# Patient Record
Sex: Female | Born: 1960 | Race: Black or African American | Hispanic: No | Smoking: Current every day smoker
Health system: Southern US, Community
[De-identification: ages and names within clinical notes are randomized; demographics above are authoritative.]

## PROBLEM LIST (undated history)

## (undated) DIAGNOSIS — Z860100 Personal history of colon polyps, unspecified: Secondary | ICD-10-CM

## (undated) DIAGNOSIS — Z5189 Encounter for other specified aftercare: Secondary | ICD-10-CM

## (undated) DIAGNOSIS — R319 Hematuria, unspecified: Secondary | ICD-10-CM

## (undated) DIAGNOSIS — K429 Umbilical hernia without obstruction or gangrene: Secondary | ICD-10-CM

## (undated) DIAGNOSIS — E785 Hyperlipidemia, unspecified: Secondary | ICD-10-CM

## (undated) DIAGNOSIS — G4700 Insomnia, unspecified: Secondary | ICD-10-CM

## (undated) DIAGNOSIS — N3281 Overactive bladder: Secondary | ICD-10-CM

## (undated) DIAGNOSIS — N200 Calculus of kidney: Secondary | ICD-10-CM

## (undated) DIAGNOSIS — G5732 Lesion of lateral popliteal nerve, left lower limb: Principal | ICD-10-CM

## (undated) DIAGNOSIS — N3289 Other specified disorders of bladder: Secondary | ICD-10-CM

## (undated) DIAGNOSIS — F419 Anxiety disorder, unspecified: Secondary | ICD-10-CM

## (undated) DIAGNOSIS — I10 Essential (primary) hypertension: Secondary | ICD-10-CM

## (undated) DIAGNOSIS — Z973 Presence of spectacles and contact lenses: Secondary | ICD-10-CM

## (undated) DIAGNOSIS — B009 Herpesviral infection, unspecified: Secondary | ICD-10-CM

## (undated) DIAGNOSIS — K5730 Diverticulosis of large intestine without perforation or abscess without bleeding: Secondary | ICD-10-CM

## (undated) DIAGNOSIS — Q181 Preauricular sinus and cyst: Secondary | ICD-10-CM

## (undated) HISTORY — DX: Umbilical hernia without obstruction or gangrene: K42.9

## (undated) HISTORY — PX: COLONOSCOPY: SHX174

## (undated) HISTORY — DX: Encounter for other specified aftercare: Z51.89

## (undated) HISTORY — DX: Essential (primary) hypertension: I10

## (undated) HISTORY — DX: Preauricular sinus and cyst: Q18.1

## (undated) HISTORY — PX: OTHER SURGICAL HISTORY: SHX169

## (undated) HISTORY — PX: POLYPECTOMY: SHX149

## (undated) HISTORY — PX: COLONOSCOPY W/ POLYPECTOMY: SHX1380

## (undated) HISTORY — DX: Anxiety disorder, unspecified: F41.9

## (undated) HISTORY — DX: Herpesviral infection, unspecified: B00.9

## (undated) HISTORY — DX: Lesion of lateral popliteal nerve, left lower limb: G57.32

## (undated) HISTORY — DX: Insomnia, unspecified: G47.00

## (undated) HISTORY — DX: Overactive bladder: N32.81

## (undated) HISTORY — DX: Hyperlipidemia, unspecified: E78.5

---

## 1982-02-10 HISTORY — PX: ABDOMINAL HYSTERECTOMY: SHX81

## 1988-02-11 HISTORY — PX: ABDOMINOPLASTY: SHX5355

## 1997-07-27 ENCOUNTER — Ambulatory Visit (HOSPITAL_COMMUNITY): Admission: RE | Admit: 1997-07-27 | Discharge: 1997-07-27 | Payer: Self-pay

## 1997-11-30 ENCOUNTER — Ambulatory Visit (HOSPITAL_COMMUNITY): Admission: RE | Admit: 1997-11-30 | Discharge: 1997-11-30 | Payer: Self-pay | Admitting: *Deleted

## 1999-02-11 HISTORY — PX: BREAST BIOPSY: SHX20

## 1999-09-16 ENCOUNTER — Ambulatory Visit (HOSPITAL_COMMUNITY): Admission: RE | Admit: 1999-09-16 | Discharge: 1999-09-16 | Payer: Self-pay | Admitting: Internal Medicine

## 1999-09-16 ENCOUNTER — Encounter: Payer: Self-pay | Admitting: Internal Medicine

## 1999-09-19 ENCOUNTER — Encounter (INDEPENDENT_AMBULATORY_CARE_PROVIDER_SITE_OTHER): Payer: Self-pay | Admitting: *Deleted

## 1999-09-19 ENCOUNTER — Other Ambulatory Visit: Admission: RE | Admit: 1999-09-19 | Discharge: 1999-09-19 | Payer: Self-pay | Admitting: Internal Medicine

## 1999-09-19 ENCOUNTER — Encounter: Payer: Self-pay | Admitting: Internal Medicine

## 1999-09-19 ENCOUNTER — Encounter: Admission: RE | Admit: 1999-09-19 | Discharge: 1999-09-19 | Payer: Self-pay | Admitting: Internal Medicine

## 2000-10-02 ENCOUNTER — Encounter: Admission: RE | Admit: 2000-10-02 | Discharge: 2000-10-02 | Payer: Self-pay | Admitting: *Deleted

## 2000-10-02 ENCOUNTER — Encounter: Payer: Self-pay | Admitting: Family Medicine

## 2001-02-10 ENCOUNTER — Emergency Department (HOSPITAL_COMMUNITY): Admission: EM | Admit: 2001-02-10 | Discharge: 2001-02-10 | Payer: Self-pay | Admitting: Emergency Medicine

## 2001-10-04 ENCOUNTER — Encounter: Payer: Self-pay | Admitting: Family Medicine

## 2001-10-04 ENCOUNTER — Ambulatory Visit (HOSPITAL_COMMUNITY): Admission: RE | Admit: 2001-10-04 | Discharge: 2001-10-04 | Payer: Self-pay | Admitting: Family Medicine

## 2003-09-25 ENCOUNTER — Ambulatory Visit (HOSPITAL_COMMUNITY): Admission: RE | Admit: 2003-09-25 | Discharge: 2003-09-25 | Payer: Self-pay | Admitting: Internal Medicine

## 2003-10-09 ENCOUNTER — Other Ambulatory Visit: Admission: RE | Admit: 2003-10-09 | Discharge: 2003-10-09 | Payer: Self-pay | Admitting: Internal Medicine

## 2004-01-02 ENCOUNTER — Ambulatory Visit (HOSPITAL_COMMUNITY): Admission: RE | Admit: 2004-01-02 | Discharge: 2004-01-02 | Payer: Self-pay

## 2004-01-02 ENCOUNTER — Encounter (INDEPENDENT_AMBULATORY_CARE_PROVIDER_SITE_OTHER): Payer: Self-pay | Admitting: *Deleted

## 2004-01-02 ENCOUNTER — Ambulatory Visit (HOSPITAL_BASED_OUTPATIENT_CLINIC_OR_DEPARTMENT_OTHER): Admission: RE | Admit: 2004-01-02 | Discharge: 2004-01-02 | Payer: Self-pay

## 2004-01-02 HISTORY — PX: BREAST BIOPSY: SHX20

## 2004-01-08 ENCOUNTER — Ambulatory Visit: Payer: Self-pay | Admitting: Gastroenterology

## 2004-01-15 ENCOUNTER — Ambulatory Visit: Payer: Self-pay | Admitting: Internal Medicine

## 2004-02-27 ENCOUNTER — Ambulatory Visit: Payer: Self-pay | Admitting: Internal Medicine

## 2004-03-08 ENCOUNTER — Ambulatory Visit: Payer: Self-pay | Admitting: Internal Medicine

## 2004-03-22 ENCOUNTER — Ambulatory Visit (HOSPITAL_COMMUNITY): Admission: RE | Admit: 2004-03-22 | Discharge: 2004-03-22 | Payer: Self-pay | Admitting: Obstetrics and Gynecology

## 2004-03-28 ENCOUNTER — Ambulatory Visit (HOSPITAL_COMMUNITY): Admission: RE | Admit: 2004-03-28 | Discharge: 2004-03-28 | Payer: Self-pay | Admitting: *Deleted

## 2004-05-10 ENCOUNTER — Encounter (INDEPENDENT_AMBULATORY_CARE_PROVIDER_SITE_OTHER): Payer: Self-pay | Admitting: *Deleted

## 2004-05-10 ENCOUNTER — Observation Stay (HOSPITAL_COMMUNITY): Admission: RE | Admit: 2004-05-10 | Discharge: 2004-05-10 | Payer: Self-pay | Admitting: Obstetrics and Gynecology

## 2004-06-10 HISTORY — PX: OTHER SURGICAL HISTORY: SHX169

## 2004-08-27 ENCOUNTER — Ambulatory Visit (HOSPITAL_BASED_OUTPATIENT_CLINIC_OR_DEPARTMENT_OTHER): Admission: RE | Admit: 2004-08-27 | Discharge: 2004-08-27 | Payer: Self-pay | Admitting: Urology

## 2004-08-27 ENCOUNTER — Encounter (INDEPENDENT_AMBULATORY_CARE_PROVIDER_SITE_OTHER): Payer: Self-pay | Admitting: Specialist

## 2004-08-27 ENCOUNTER — Ambulatory Visit (HOSPITAL_COMMUNITY): Admission: RE | Admit: 2004-08-27 | Discharge: 2004-08-27 | Payer: Self-pay | Admitting: Urology

## 2004-08-27 ENCOUNTER — Encounter (INDEPENDENT_AMBULATORY_CARE_PROVIDER_SITE_OTHER): Payer: Self-pay | Admitting: *Deleted

## 2004-08-27 HISTORY — PX: OTHER SURGICAL HISTORY: SHX169

## 2005-01-07 ENCOUNTER — Ambulatory Visit (HOSPITAL_COMMUNITY): Admission: RE | Admit: 2005-01-07 | Discharge: 2005-01-07 | Payer: Self-pay | Admitting: Internal Medicine

## 2005-02-05 ENCOUNTER — Ambulatory Visit: Payer: Self-pay | Admitting: Internal Medicine

## 2005-02-18 ENCOUNTER — Ambulatory Visit: Payer: Self-pay | Admitting: Internal Medicine

## 2005-02-26 ENCOUNTER — Ambulatory Visit: Payer: Self-pay | Admitting: Internal Medicine

## 2005-03-06 ENCOUNTER — Other Ambulatory Visit: Admission: RE | Admit: 2005-03-06 | Discharge: 2005-03-06 | Payer: Self-pay | Admitting: Obstetrics and Gynecology

## 2006-03-04 ENCOUNTER — Ambulatory Visit: Payer: Self-pay | Admitting: Internal Medicine

## 2006-03-04 LAB — CONVERTED CEMR LAB
ALT: 17 units/L (ref 0–40)
AST: 18 units/L (ref 0–37)
BUN: 10 mg/dL (ref 6–23)
Calcium: 9.4 mg/dL (ref 8.4–10.5)
Chloride: 105 meq/L (ref 96–112)
GFR calc Af Amer: 117 mL/min
GFR calc non Af Amer: 97 mL/min
HCT: 41.7 % (ref 36.0–46.0)
Hemoglobin: 14.3 g/dL (ref 12.0–15.0)
Ketones, ur: NEGATIVE mg/dL
MCV: 97.3 fL (ref 78.0–100.0)
Nitrite: NEGATIVE
Potassium: 3.8 meq/L (ref 3.5–5.1)
RDW: 13.5 % (ref 11.5–14.6)
Sodium: 139 meq/L (ref 135–145)
TSH: 1.73 microintl units/mL (ref 0.35–5.50)
VLDL: 12 mg/dL (ref 0–40)
WBC: 4.1 10*3/uL — ABNORMAL LOW (ref 4.5–10.5)
pH: 7.5 (ref 5.0–8.0)

## 2006-04-15 ENCOUNTER — Encounter: Payer: Self-pay | Admitting: Internal Medicine

## 2006-04-15 ENCOUNTER — Other Ambulatory Visit: Admission: RE | Admit: 2006-04-15 | Discharge: 2006-04-15 | Payer: Self-pay | Admitting: Internal Medicine

## 2006-04-15 ENCOUNTER — Ambulatory Visit: Payer: Self-pay | Admitting: Internal Medicine

## 2006-05-06 ENCOUNTER — Encounter: Admission: RE | Admit: 2006-05-06 | Discharge: 2006-05-06 | Payer: Self-pay | Admitting: Internal Medicine

## 2006-05-22 DIAGNOSIS — M7989 Other specified soft tissue disorders: Secondary | ICD-10-CM | POA: Insufficient documentation

## 2006-05-22 DIAGNOSIS — I872 Venous insufficiency (chronic) (peripheral): Secondary | ICD-10-CM | POA: Insufficient documentation

## 2006-05-22 DIAGNOSIS — E785 Hyperlipidemia, unspecified: Secondary | ICD-10-CM | POA: Insufficient documentation

## 2006-05-22 DIAGNOSIS — B009 Herpesviral infection, unspecified: Secondary | ICD-10-CM | POA: Insufficient documentation

## 2006-07-22 ENCOUNTER — Telehealth (INDEPENDENT_AMBULATORY_CARE_PROVIDER_SITE_OTHER): Payer: Self-pay | Admitting: *Deleted

## 2006-11-11 ENCOUNTER — Ambulatory Visit: Payer: Self-pay | Admitting: Internal Medicine

## 2006-11-11 DIAGNOSIS — G4700 Insomnia, unspecified: Secondary | ICD-10-CM | POA: Insufficient documentation

## 2006-11-11 LAB — CONVERTED CEMR LAB
Blood in Urine, dipstick: NEGATIVE
Glucose, Urine, Semiquant: NEGATIVE
Ketones, urine, test strip: NEGATIVE
Nitrite: NEGATIVE
Specific Gravity, Urine: >=1.03
pH: 5

## 2006-11-26 ENCOUNTER — Telehealth: Payer: Self-pay | Admitting: Internal Medicine

## 2006-12-18 ENCOUNTER — Ambulatory Visit: Payer: Self-pay | Admitting: Family Medicine

## 2007-01-25 ENCOUNTER — Telehealth: Payer: Self-pay | Admitting: Internal Medicine

## 2007-02-05 ENCOUNTER — Telehealth: Payer: Self-pay | Admitting: Internal Medicine

## 2007-03-12 ENCOUNTER — Ambulatory Visit: Payer: Self-pay | Admitting: Internal Medicine

## 2007-03-18 LAB — CONVERTED CEMR LAB
HDL: 80.7 mg/dL (ref >39.0)
Triglycerides: 41 mg/dL (ref 0–149)

## 2007-03-22 ENCOUNTER — Telehealth (INDEPENDENT_AMBULATORY_CARE_PROVIDER_SITE_OTHER): Payer: Self-pay | Admitting: *Deleted

## 2007-05-31 ENCOUNTER — Ambulatory Visit: Payer: Self-pay | Admitting: Internal Medicine

## 2007-09-13 ENCOUNTER — Telehealth (INDEPENDENT_AMBULATORY_CARE_PROVIDER_SITE_OTHER): Payer: Self-pay | Admitting: *Deleted

## 2007-09-23 ENCOUNTER — Telehealth (INDEPENDENT_AMBULATORY_CARE_PROVIDER_SITE_OTHER): Payer: Self-pay | Admitting: *Deleted

## 2007-11-23 ENCOUNTER — Telehealth: Payer: Self-pay | Admitting: Internal Medicine

## 2007-12-20 ENCOUNTER — Telehealth (INDEPENDENT_AMBULATORY_CARE_PROVIDER_SITE_OTHER): Payer: Self-pay | Admitting: *Deleted

## 2008-01-13 ENCOUNTER — Telehealth (INDEPENDENT_AMBULATORY_CARE_PROVIDER_SITE_OTHER): Payer: Self-pay | Admitting: *Deleted

## 2008-01-21 ENCOUNTER — Ambulatory Visit: Payer: Self-pay | Admitting: Family Medicine

## 2008-01-24 ENCOUNTER — Telehealth (INDEPENDENT_AMBULATORY_CARE_PROVIDER_SITE_OTHER): Payer: Self-pay | Admitting: *Deleted

## 2008-02-03 ENCOUNTER — Ambulatory Visit: Payer: Self-pay | Admitting: Internal Medicine

## 2008-02-08 ENCOUNTER — Telehealth (INDEPENDENT_AMBULATORY_CARE_PROVIDER_SITE_OTHER): Payer: Self-pay | Admitting: *Deleted

## 2008-02-17 ENCOUNTER — Encounter: Admission: RE | Admit: 2008-02-17 | Discharge: 2008-02-17 | Payer: Self-pay | Admitting: Internal Medicine

## 2008-02-21 ENCOUNTER — Encounter (INDEPENDENT_AMBULATORY_CARE_PROVIDER_SITE_OTHER): Payer: Self-pay | Admitting: *Deleted

## 2008-02-21 ENCOUNTER — Telehealth (INDEPENDENT_AMBULATORY_CARE_PROVIDER_SITE_OTHER): Payer: Self-pay | Admitting: *Deleted

## 2008-03-16 ENCOUNTER — Telehealth (INDEPENDENT_AMBULATORY_CARE_PROVIDER_SITE_OTHER): Payer: Self-pay | Admitting: *Deleted

## 2008-07-11 ENCOUNTER — Telehealth (INDEPENDENT_AMBULATORY_CARE_PROVIDER_SITE_OTHER): Payer: Self-pay | Admitting: *Deleted

## 2008-07-11 ENCOUNTER — Encounter: Payer: Self-pay | Admitting: Gastroenterology

## 2008-10-06 ENCOUNTER — Telehealth (INDEPENDENT_AMBULATORY_CARE_PROVIDER_SITE_OTHER): Payer: Self-pay | Admitting: *Deleted

## 2008-10-11 ENCOUNTER — Telehealth (INDEPENDENT_AMBULATORY_CARE_PROVIDER_SITE_OTHER): Payer: Self-pay | Admitting: *Deleted

## 2008-10-13 ENCOUNTER — Ambulatory Visit: Payer: Self-pay | Admitting: Pulmonary Disease

## 2008-10-13 ENCOUNTER — Encounter: Payer: Self-pay | Admitting: Internal Medicine

## 2008-11-06 ENCOUNTER — Telehealth (INDEPENDENT_AMBULATORY_CARE_PROVIDER_SITE_OTHER): Payer: Self-pay | Admitting: *Deleted

## 2008-11-09 ENCOUNTER — Encounter: Payer: Self-pay | Admitting: Gastroenterology

## 2008-11-14 ENCOUNTER — Telehealth: Payer: Self-pay | Admitting: Internal Medicine

## 2008-11-29 ENCOUNTER — Telehealth: Payer: Self-pay | Admitting: Internal Medicine

## 2008-12-01 ENCOUNTER — Telehealth (INDEPENDENT_AMBULATORY_CARE_PROVIDER_SITE_OTHER): Payer: Self-pay | Admitting: *Deleted

## 2008-12-01 ENCOUNTER — Encounter: Payer: Self-pay | Admitting: Gastroenterology

## 2008-12-07 ENCOUNTER — Encounter: Payer: Self-pay | Admitting: Internal Medicine

## 2008-12-11 ENCOUNTER — Telehealth: Payer: Self-pay | Admitting: Internal Medicine

## 2008-12-13 ENCOUNTER — Telehealth (INDEPENDENT_AMBULATORY_CARE_PROVIDER_SITE_OTHER): Payer: Self-pay | Admitting: *Deleted

## 2008-12-15 ENCOUNTER — Encounter: Payer: Self-pay | Admitting: Internal Medicine

## 2008-12-19 ENCOUNTER — Ambulatory Visit: Payer: Self-pay | Admitting: Internal Medicine

## 2008-12-19 DIAGNOSIS — R634 Abnormal weight loss: Secondary | ICD-10-CM | POA: Insufficient documentation

## 2008-12-29 ENCOUNTER — Ambulatory Visit: Payer: Self-pay | Admitting: Gastroenterology

## 2008-12-29 DIAGNOSIS — K625 Hemorrhage of anus and rectum: Secondary | ICD-10-CM | POA: Insufficient documentation

## 2009-01-03 ENCOUNTER — Ambulatory Visit: Payer: Self-pay | Admitting: Internal Medicine

## 2009-01-08 ENCOUNTER — Ambulatory Visit: Payer: Self-pay | Admitting: Gastroenterology

## 2009-01-12 ENCOUNTER — Encounter: Payer: Self-pay | Admitting: Gastroenterology

## 2009-01-18 ENCOUNTER — Telehealth: Payer: Self-pay | Admitting: Gastroenterology

## 2009-03-01 ENCOUNTER — Encounter: Admission: RE | Admit: 2009-03-01 | Discharge: 2009-03-01 | Payer: Self-pay | Admitting: Internal Medicine

## 2009-03-05 ENCOUNTER — Ambulatory Visit: Payer: Self-pay | Admitting: Gastroenterology

## 2009-03-05 DIAGNOSIS — Z860100 Personal history of colon polyps, unspecified: Secondary | ICD-10-CM | POA: Insufficient documentation

## 2009-03-23 ENCOUNTER — Ambulatory Visit: Payer: Self-pay | Admitting: Internal Medicine

## 2009-03-26 LAB — CONVERTED CEMR LAB
BUN: 13 mg/dL (ref 6–23)
Basophils Absolute: 0 10*3/uL (ref 0.0–0.1)
Calcium: 8.9 mg/dL (ref 8.4–10.5)
Creatinine, Ser: 0.7 mg/dL (ref 0.4–1.2)
Eosinophils Absolute: 0.1 10*3/uL (ref 0.0–0.7)
GFR calc non Af Amer: 115.31 mL/min (ref >60)
Glucose, Bld: 88 mg/dL (ref 70–99)
Hemoglobin: 12.8 g/dL (ref 12.0–15.0)
Lymphocytes Relative: 41.2 % (ref 12.0–46.0)
Monocytes Relative: 14.2 % — ABNORMAL HIGH (ref 3.0–12.0)
Neutrophils Relative %: 40.8 % — ABNORMAL LOW (ref 43.0–77.0)
Platelets: 159 10*3/uL (ref 150.0–400.0)
RDW: 13.7 % (ref 11.5–14.6)
Sodium: 141 meq/L (ref 135–145)
TSH: 1.91 microintl units/mL (ref 0.35–5.50)

## 2009-08-03 ENCOUNTER — Ambulatory Visit: Payer: Self-pay | Admitting: Internal Medicine

## 2009-08-03 DIAGNOSIS — F411 Generalized anxiety disorder: Secondary | ICD-10-CM | POA: Insufficient documentation

## 2009-08-07 LAB — CONVERTED CEMR LAB
Cholesterol: 251 mg/dL — ABNORMAL HIGH (ref 0–200)
Folate: 9.1 ng/mL
HDL: 88.7 mg/dL (ref >39.00)
Hemoglobin: 12.5 g/dL (ref 12.0–15.0)
Triglycerides: 39 mg/dL (ref 0.0–149.0)
Vitamin B-12: 301 pg/mL (ref 211–911)

## 2009-08-10 ENCOUNTER — Telehealth (INDEPENDENT_AMBULATORY_CARE_PROVIDER_SITE_OTHER): Payer: Self-pay | Admitting: *Deleted

## 2009-08-17 ENCOUNTER — Ambulatory Visit: Payer: Self-pay | Admitting: Family Medicine

## 2009-08-17 LAB — CONVERTED CEMR LAB
Bilirubin Urine: NEGATIVE
Glucose, Urine, Semiquant: NEGATIVE
Specific Gravity, Urine: 1.025
Urobilinogen, UA: 0.2
WBC Urine, dipstick: NEGATIVE
pH: 6

## 2009-08-18 ENCOUNTER — Encounter: Payer: Self-pay | Admitting: Family Medicine

## 2009-08-21 ENCOUNTER — Telehealth (INDEPENDENT_AMBULATORY_CARE_PROVIDER_SITE_OTHER): Payer: Self-pay | Admitting: *Deleted

## 2009-08-21 LAB — CONVERTED CEMR LAB: Crystals: NONE SEEN

## 2009-08-22 ENCOUNTER — Encounter: Payer: Self-pay | Admitting: Internal Medicine

## 2009-09-05 ENCOUNTER — Encounter: Payer: Self-pay | Admitting: Internal Medicine

## 2009-09-13 ENCOUNTER — Telehealth (INDEPENDENT_AMBULATORY_CARE_PROVIDER_SITE_OTHER): Payer: Self-pay | Admitting: *Deleted

## 2009-09-14 ENCOUNTER — Ambulatory Visit: Payer: Self-pay | Admitting: Family Medicine

## 2009-09-14 DIAGNOSIS — N760 Acute vaginitis: Secondary | ICD-10-CM | POA: Insufficient documentation

## 2009-09-14 LAB — CONVERTED CEMR LAB: Whiff Test: POSITIVE

## 2009-09-19 ENCOUNTER — Ambulatory Visit: Payer: Self-pay | Admitting: Internal Medicine

## 2009-09-21 LAB — CONVERTED CEMR LAB
AST: 19 units/L (ref 0–37)
Cholesterol: 175 mg/dL (ref 0–200)
LDL Cholesterol: 86 mg/dL (ref 0–99)
Triglycerides: 41 mg/dL (ref 0.0–149.0)

## 2009-10-12 ENCOUNTER — Telehealth: Payer: Self-pay | Admitting: Internal Medicine

## 2009-11-15 ENCOUNTER — Ambulatory Visit: Payer: Self-pay | Admitting: Internal Medicine

## 2009-11-21 ENCOUNTER — Encounter: Payer: Self-pay | Admitting: Internal Medicine

## 2009-11-27 ENCOUNTER — Telehealth: Payer: Self-pay | Admitting: Internal Medicine

## 2009-12-31 ENCOUNTER — Telehealth: Payer: Self-pay | Admitting: Internal Medicine

## 2010-01-01 ENCOUNTER — Ambulatory Visit (HOSPITAL_BASED_OUTPATIENT_CLINIC_OR_DEPARTMENT_OTHER): Admission: RE | Admit: 2010-01-01 | Discharge: 2010-01-01 | Payer: Self-pay | Admitting: Family Medicine

## 2010-01-01 ENCOUNTER — Ambulatory Visit: Payer: Self-pay | Admitting: Diagnostic Radiology

## 2010-01-01 ENCOUNTER — Ambulatory Visit: Payer: Self-pay | Admitting: Family Medicine

## 2010-01-01 DIAGNOSIS — M25559 Pain in unspecified hip: Secondary | ICD-10-CM | POA: Insufficient documentation

## 2010-01-02 LAB — CONVERTED CEMR LAB
Candida species: NEGATIVE
Gardnerella vaginalis: POSITIVE — AB

## 2010-01-07 ENCOUNTER — Encounter: Payer: Self-pay | Admitting: Internal Medicine

## 2010-01-07 LAB — CONVERTED CEMR LAB
Albumin: 4.2 g/dL (ref 3.5–5.2)
Alkaline Phosphatase: 55 units/L (ref 39–117)
Cholesterol: 218 mg/dL — ABNORMAL HIGH (ref 0–200)
Direct LDL: 101 mg/dL
HDL: 88.9 mg/dL (ref >39.00)
Total CHOL/HDL Ratio: 2
Total Protein: 7 g/dL (ref 6.0–8.3)
VLDL: 10.4 mg/dL (ref 0.0–40.0)

## 2010-01-10 ENCOUNTER — Telehealth: Payer: Self-pay | Admitting: Internal Medicine

## 2010-01-24 ENCOUNTER — Telehealth (INDEPENDENT_AMBULATORY_CARE_PROVIDER_SITE_OTHER): Payer: Self-pay | Admitting: *Deleted

## 2010-01-25 ENCOUNTER — Telehealth (INDEPENDENT_AMBULATORY_CARE_PROVIDER_SITE_OTHER): Payer: Self-pay | Admitting: *Deleted

## 2010-03-10 LAB — CONVERTED CEMR LAB
AST: 21 units/L (ref 0–37)
Alkaline Phosphatase: 57 units/L (ref 39–117)
Chloride: 103 meq/L (ref 96–112)
Direct LDL: 148.5 mg/dL
Eosinophils Relative: 2 % (ref 0–5)
GFR calc non Af Amer: 115 mL/min
Glucose, Bld: 90 mg/dL
HCT: 38.5 % (ref 36.0–46.0)
HDL: 82 mg/dL
Hemoglobin: 12.6 g/dL (ref 12.0–15.0)
LDL Cholesterol: 169 mg/dL
Lymphocytes Relative: 31 % (ref 12–46)
Lymphs Abs: 1.5 10*3/uL (ref 0.7–4.0)
Neutro Abs: 2.7 10*3/uL (ref 1.7–7.7)
Platelets: 243 10*3/uL (ref 150–400)
Potassium: 4 meq/L (ref 3.5–5.1)
TSH: 1.61 microintl units/mL (ref 0.35–5.50)
Total Bilirubin: 0.7 mg/dL (ref 0.3–1.2)
Total CHOL/HDL Ratio: 2.9
Triglycerides: 51 mg/dL
VLDL: 9 mg/dL (ref 0–40)
WBC: 4.8 10*3/uL (ref 4.0–10.5)

## 2010-03-14 NOTE — Progress Notes (Signed)
Summary: please resend prescription to [facility]  Phone Note Call from Patient   Caller: Patient Summary of Call: patient has checked with [facility] this morning and they do not have anything for the prescription for Flagyl sent over yesterday afternoon--please resend Initial call taken by: Jerolyn Shin,  January 25, 2010 8:41 AM  Follow-up for Phone Call        Left Pt detail message Rx re-sent to pharmacy...........Marland KitchenFelecia Deloach CMA  January 25, 2010 9:58 AM     Prescriptions: FLAGYL 500 MG TABS (METRONIDAZOLE) Take 1 by mouth two times a day x7 days  #14 x 0   Entered by:   Jeremy Johann CMA   Authorized by:   Neena Rhymes MD   Signed by:   Jeremy Johann CMA on 01/25/2010   Method used:   Re-Faxed to ...       [facility] [address] (retail)       [phone]       [place], [place]  [address]       Ph: [phone]       Fax: [phone]   RxID:   [id number]

## 2010-03-14 NOTE — Assessment & Plan Note (Signed)
Summary: has infection??///sph   Vital Signs:  Patient profile:   50 year old female Weight:      147 pounds Pulse rate:   73 / minute BP sitting:   114 / 80  (left arm)  Vitals Entered By: Doristine Devoid CMA (January 01, 2010 10:11 AM) CC: yeast infection or bacterial infection noticed odor and discharge   History of Present Illness: 50 yo woman here today for possible GYN infxn.  took Zpack recently.  no d/c but odor.  no itching.  first noticed sxs 1 week ago.  cough- persistant.  otherwise feels well.  no fevers.  feels like a throat 'tickle'.  cough is dry during the day, productive at night.  L hip pain- had pain 1 week ago, took ibuprofen and used heating pad w/ good relief.  pain returned this AM.  doesn't radiate down leg, no weakness or numbness  Allergies (verified): 1)  ! * Latex  Past History:  Past medical, surgical, family and social histories (including risk factors) reviewed for relevance to current acute and chronic problems.  Past Medical History: Reviewed history from 11/15/2009 and no changes required. left calf-leg  is larger than the right leg  for  years (circumferentially 1 inch larger) Hyperlipidemia G2 P1 Ab1 h/o hematuria insomnia  OAB HSV Diverticulosis Hemorrhoids  Past Surgical History: Reviewed history from 01/21/2008 and no changes required. 1990 - "tummy tuck" 5th toes resected bilat Hysterectomy (1984)- has 1 ovary Caesarean section breast bx (RT) 2001 lumpectomy (RT) 2005  bladder cystoscopy  Family History: Reviewed history from 08/03/2009 and no changes required. DM-- F   High cholesterol-- M  Hypertension-- S colon ca-- no breast ca--no  allergies: mother cancer: paternal aunt (lung)   Social History: Reviewed history from 08/03/2009 and no changes required. 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  started at age 51.  1/3  ppd.  Alcohol Use -  no Illicit Drug Use - no  Review of Systems      See HPI  Physical Exam  General:  alert and well-developed.   Head:  face symmetric, NCAT, no TTP over sinuses Eyes:  PERRL, EOMI Ears:  R ear normal and L ear normal.   Nose:  External nasal examination shows no deformity or inflammation. Nasal mucosa are pink and moist without lesions or exudates. Mouth:  Oral mucosa and oropharynx without lesions or exudates.  Teeth in good repair. Lungs:  Normal respiratory effort, chest expands symmetrically. Lungs are clear to auscultation, no crackles or wheezes.  dry cough. Heart:  normal rate, regular rhythm, and no murmur.   Genitalia:  normal introitus and no external lesions.  scant vaginal d/c.  no yeast apparent.   Msk:  L hip flexor mildly TTP.  full ROM of hips bilaterally. Pulses:  +2 DP/PT Extremities:  no C/C/E   Impression & Recommendations:  Problem # 1:  VAGINITIS (ICD-616.10) Assessment Unchanged  check wet prep.  await results to treat. Orders: Specimen Handling (81191) T-Wet Prep ([id number])  Her updated medication list for this problem includes:    Metronidazole 500 Mg Tabs (Metronidazole) .Marland Kitchen... Take one tablet two times a day x7 days  Problem # 2:  COUGH (ICD-786.2) Assessment: New likely post-infectious.  check CXR to r/o infxn.  hold on tx until CXR available for review. Orders: T-2 View CXR (71020TC)  Problem # 3:  HIP PAIN, LEFT (ICD-719.45) Assessment: New likely hip flexor tendonitis.  continue NSAIDs and ice/heat as needed.  Complete Medication List: 1)  Xanax 0.5 Mg Tabs (Alprazolam) .... 2 by mouth at bedtime fax [phone] 2)  Acyclovir 400 Mg Tabs (Acyclovir) .Marland Kitchen.. 1 by mouth two times a day as needed 3)  Citalopram Hydrobromide 20 Mg Tabs (Citalopram hydrobromide) .... 1.5  by mouth once daily 4)  Simvastatin 20 Mg Tabs (Simvastatin) .Marland Kitchen.. 1 by mouth at bedtime. 5)  Flonase 50 Mcg/act Susp (Fluticasone propionate) .... 2 sprays on each side of the  nose once daily 6)  Metronidazole 500 Mg Tabs (Metronidazole) .... Take one tablet two times a day x7 days  Other Orders: Venipuncture (11914) TLB-Lipid Panel (80061-LIPID) TLB-Hepatic/Liver Function Pnl (80076-HEPATIC)  Patient Instructions: 1)  We'll notify you of your lab results 2)  Go to the [facility] on [address] to get your chest xray- we'll call you with these results 3)  Add Claritin or Zyrtec daily for the allergy component 4)  Continue the Flonase daily 5)  Add mucinex to thin your congestion 6)  You have tendonitis- this is causing your hip pain.  Take ibuprofen 2-3x/day and use a heating pad for pain relief 7)  Hang in there! 8)  Happy Holidays!   Orders Added: 1)  Specimen Handling [99000] 2)  T-Wet Prep [[id number]] 3)  T-2 View CXR [71020TC] 4)  Venipuncture [36415] 5)  Specimen Handling [99000] 6)  TLB-Lipid Panel [80061-LIPID] 7)  TLB-Hepatic/Liver Function Pnl [80076-HEPATIC] 8)  Est. Patient Level IV [86578]

## 2010-03-14 NOTE — Assessment & Plan Note (Signed)
Summary: blood in urine//kn   Vital Signs:  Patient profile:   50 year old female Weight:      140 pounds Temp:     98.3 degrees F oral BP sitting:   114 / 64  (left arm)  Vitals Entered By: Doristine Devoid (August 17, 2009 9:12 AM) CC: blood in urine some mild cramping    History of Present Illness: 50 yo woman here today for hematuria.  sxs started yesterday.  also having bleeding from the rectum w/ BM yesterday- recently had colonoscopy w/ polyp removal, told she had diverticulosis (4-5 months ago).  no pain w/ urination.  mild suprapubic pain today- 'a little cramping or something'.  no back pain.  also having blood on toilet tissue after urination.  has seen urology in the past.  pt is a current smoker.  no N/V, fever.  Preventive Screening-Counseling & Management  Alcohol-Tobacco     Smoking Status: current     Smoking Cessation Counseling: yes     Smoke Cessation Stage: contemplative  Problems Prior to Update: 1)  Gross Hematuria  (ICD-599.71) 2)  Anxiety  (ICD-300.00) 3)  Personal History of Colonic Polyps  (ICD-V12.72) 4)  Rectal Bleeding  (ICD-569.3) 5)  Weight Loss  (ICD-783.21) 6)  Persistent Disorder Initiating/maintaining Sleep  (ICD-307.42) 7)  Health Screening  (ICD-V70.0) 8)  Colonoscopy, Hx of  (ICD-V12.79) 9)  ? of Overactive Bladder  (ICD-596.51) 10)  Insomnia-sleep Disorder-unspec  (ICD-307.40) 11)  Venous Insufficiency  (ICD-459.81) 12)  Hsv  (ICD-054.9) 13)  Hyperlipidemia  (ICD-272.4)  Current Medications (verified): 1)  Xanax 0.5 Mg  Tabs (Alprazolam) .... 2 By Mouth At Bedtime Fax [phone] 2)  Acyclovir 400 Mg Tabs (Acyclovir) .Marland Kitchen.. 1 By Mouth Two Times A Day As Needed 3)  Citalopram Hydrobromide 20 Mg Tabs (Citalopram Hydrobromide) .... 1.5  By Mouth Once Daily 4)  Simvastatin 20 Mg Tabs (Simvastatin) .Marland Kitchen.. 1 By Mouth At Bedtime.  Allergies (verified): 1)  ! 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  started at age 11.  1/3  ppd.  Alcohol Use - no Illicit Drug Use - no  Review of Systems      See HPI  Physical Exam  General:  alert, well-developed, and well-nourished.   Abdomen:  soft, NT/ND, +BS Rectal:  small external skin tag, no palpable internal abnormalities but gross blood on glove. Genitalia:  normal introitus, no external lesions, and no vaginal discharge.     Impression & Recommendations:  Problem # 1:  GROSS HEMATURIA (ICD-599.71) Assessment Unchanged pt w/ bright red urine.  recent Hgb WNL.  no evidence of infxn- will not start abx.  pt has seen urology in past.  no pain- stone not likely.  encouraged pt to call urology and try and get appt for today.  given smoking hx concerned for bladder malignancy.  reviewed supportive care and red flags that should prompt return.  Pt expresses understanding and is in agreement w/ this plan. Orders: Specimen Handling (73710) T-Culture, Urine ([id number]) T-Urine Microscopic [id number])  Problem # 2:  RECTAL BLEEDING (ICD-569.3) Assessment: Unchanged pt w/ hx.  recently dx'd w/ diverticulosis.  had BRB yesterday w/ straining.  offered to re-refer to GI- pt not interested in w/u at this time.  encouraged stool softener, increased fluids, fiber.  will follow.  Complete Medication List: 1)  Xanax 0.5 Mg Tabs (Alprazolam) .... 2 by mouth at bedtime fax [phone] 2)  Acyclovir 400 Mg Tabs (Acyclovir) .Marland Kitchen.. 1 by mouth two times a day as needed 3)  Citalopram Hydrobromide 20 Mg Tabs (Citalopram hydrobromide) .... 1.5  by mouth once daily 4)  Simvastatin 20 Mg Tabs (Simvastatin) .Marland Kitchen.. 1 by mouth at bedtime.  Patient  Instructions: 1)  Please call [facility] at [phone] and tell them you are having blood in your urine 2)  Start the stool softener daily, increase your fluid intake, increase your fiber- if you continue to have blood in your stool, please call your GI doctor 3)  STOP SMOKING! 4)  There is no evidence of infection in your urine so an antibiotic will not help at this time 5)  If anything changes over the weekend- you have uncontrolled bleeding or other concerns- please go to the ER 6)  Hang in there!  Laboratory Results   Urine Tests    Routine Urinalysis   Color: red Glucose: negative   (Normal Range: Negative) Bilirubin: negative   (Normal Range: Negative) Ketone: negative   (Normal Range: Negative) Spec. Gravity: 1.025   (Normal Range: 1.003-1.035) Blood: large   (Normal Range: Negative) pH: 6.0   (Normal Range: 5.0-8.0) Protein: negative   (Normal Range: Negative) Urobilinogen: 0.2   (Normal Range: 0-1) Nitrite: negative   (Normal Range: Negative) Leukocyte Esterace: negative   (Normal Range: Negative)       Appended Document: blood in urine//kn     Clinical Lists Changes  Orders: Added new Service order of UA Dipstick w/o Micro (manual) (74259) - Signed

## 2010-03-14 NOTE — Progress Notes (Signed)
Summary: Bronchitis/not a 100% better  Phone Note Call from Patient Call back at Home Phone [phone]   Caller: Patient Summary of Call: Pt was here for bronchiitis, she was prescribed a zpack. She finished the Zpack last monday. Since last monday she has been taking Sudafed, Robitussion DM. Her cough has gotten better. She is having drainage in her throat from her nose. But she can still here it in her chest. Pt would like to know if we would prescribe another round of ATB's. Please Advise.  [facility]  Initial call taken by: Army Fossa CMA,  November 27, 2009 11:39 AM  Follow-up for Phone Call        note reviewed, she had no wheezing rec Flonase 2 sprays on each side of the nose once daily (for post nasal dripping) call #1, no RF continue OTC, wait few more days  Follow-up by: Jose E. Paz MD,  November 28, 2009 3:12 PM  Additional Follow-up for Phone Call Additional follow up Details #1::        Pt is aware.  Additional Follow-up by: Army Fossa CMA,  November 28, 2009 3:15 PM    New/Updated Medications: FLONASE 50 MCG/ACT SUSP (FLUTICASONE PROPIONATE) 2 sprays on each side of the nose once daily Prescriptions: FLONASE 50 MCG/ACT SUSP (FLUTICASONE PROPIONATE) 2 sprays on each side of the nose once daily  #1 x 1   Entered by:   Army Fossa CMA   Authorized by:   Nolon Rod. Paz MD   Signed by:   Army Fossa CMA on 11/28/2009   Method used:   Electronically to        [facility] [address] (retail)       [phone]       [place], [place]  [address]       Ph: [phone]       Fax: [phone]   RxID:   [id number]

## 2010-03-14 NOTE — Progress Notes (Signed)
Summary: ? Yeast Infection  Phone Note Call from Patient Call back at Home Phone [phone]   Caller: Patient Summary of Call: Patient called and LM on triage VM stating that she would like a rx for diflucan because she thinks she has a yeast infection. She has a smell down there not but discharge. She would like it called into [facility]. Please advise.  Initial call taken by: Harold Barban,  September 13, 2009 10:08 AM  Follow-up for Phone Call        Patient was called and advised to come in for an OV because those were not symptoms of a yeast infection.  Patient is coming in this afternoon.  Follow-up by: Harold Barban,  September 13, 2009 12:06 PM

## 2010-03-14 NOTE — Progress Notes (Signed)
Summary: Celexa denial  Phone Note From Other Clinic   Summary of Call: I received fax from [facility] stating that patient qty override of Celexa 20mg  has been denied, any additional qty must be paid for by the patient. We can try to appeal, but I need very detailed/specific reasons why it is medically necessary that the patient MUST take 1.5 per day (only 1 per day is covered). Sending an appeal still does not guarantee payment. Please advise. Initial call taken by: Lucious Groves CMA,  January 10, 2010 12:03 PM  Follow-up for Phone Call        changes prescription to: Citalopram 20 mg "one tablet twice a day" advise patient to take 1.5 tablets daily. Follow-up by: Nolon Rod. Paz MD,  January 11, 2010 9:21 AM  Additional Follow-up for Phone Call Additional follow up Details #1::        I spoke with MD and patient can try the two times a day prescription or just take prescription to a different pharmacy and pay out of pocket. Patient notified and states that she just got prescription for a month and a half and didnt have any problems. She will call when she is ready for another prescription. Additional Follow-up by: Lucious Groves CMA,  January 11, 2010 1:47 PM

## 2010-03-14 NOTE — Assessment & Plan Note (Signed)
Summary: f.u after proc...em    History of Present Illness Visit Type: Follow-up Visit Primary GI MD: Melvia Heaps MD [facility] Primary Provider: Nolon Rod. Paz MD Requesting Provider: n/a Chief Complaint: f/u after colonoscopy, discuss results. Pt denies any GI sx. Pt states the bleeding has stopped since the procedure.  History of Present Illness:   Erin Fox has returned following colonoscopy for rectal bleeding.  A pedunculated polyp in the splenic flexure was removed.  Internal hemorrhoids and diverticula were seen as well.  Since the procedure she has had no further bleeding.  She currently has no GI complaints.   GI Review of Systems      Denies abdominal pain, acid reflux, belching, bloating, chest pain, dysphagia with liquids, dysphagia with solids, heartburn, loss of appetite, nausea, vomiting, vomiting blood, weight loss, and  weight gain.        Denies anal fissure, black tarry stools, change in bowel habit, constipation, diarrhea, diverticulosis, fecal incontinence, heme positive stool, hemorrhoids, irritable bowel syndrome, jaundice, light color stool, liver problems, rectal bleeding, and  rectal pain.    Current Medications (verified): 1)  Xanax 0.5 Mg  Tabs (Alprazolam) .... 2 By Mouth At Bedtime Fax [phone] 2)  Acyclovir 400 Mg Tabs (Acyclovir) .Marland Kitchen.. 1 By Mouth Two Times A Day As Needed 3)  Citalopram Hydrobromide 20 Mg Tabs (Citalopram Hydrobromide) .Marland Kitchen.. 1 By Mouth Once Daily 4)  Detrol La 4 Mg Xr24h-Cap (Tolterodine Tartrate) .Marland Kitchen.. 1 By Mouth Once Daily (Patient Will Start-Near Future)  Allergies (verified): 1)  ! * Latex  Past History:  Past Medical History: Reviewed history from 12/27/2008 and no changes required.      Hyperlipidemia       G2 P1 Ab1       h/o hematuria       insomnia       OAB       HSV       Diverticulosis       Hemorrhoids  Past Surgical History: Reviewed history from 01/21/2008 and no changes required. 1990 - "tummy tuck" 5th toes  resected bilat Hysterectomy (1984)- has 1 ovary Caesarean section breast bx (RT) 2001 lumpectomy (RT) 2005  bladder cystoscopy  Family History: Reviewed history from 10/13/2008 and no changes required. Family History Diabetes 1st degree relative Family History High cholesterol Family History Hypertension   allergies: mother cancer: paternal aunt (lung)   Social History: Reviewed history from 12/29/2008 and no changes required. 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  started at age 73.  1/2 ppd.  Alcohol Use - no Illicit Drug Use - no  Review of Systems  The patient denies allergy/sinus, anemia, anxiety-new, arthritis/joint pain, back pain, blood in urine, breast changes/lumps, change in vision, confusion, cough, coughing up blood, depression-new, fainting, fatigue, fever, headaches-new, hearing problems, heart murmur, heart rhythm changes, itching, menstrual pain, muscle pains/cramps, night sweats, nosebleeds, pregnancy symptoms, shortness of breath, skin rash, sleeping problems, sore throat, swelling of feet/legs, swollen lymph glands, thirst - excessive , urination - excessive , urination changes/pain, urine leakage, vision changes, and voice change.    Vital Signs:  Patient profile:   50 year old female Height:      67.75 inches Weight:      149 pounds BMI:     22.91 Pulse rate:   60 / minute Pulse rhythm:   regular BP sitting:   112 / 68  (right arm) Cuff size:  regular  Vitals Entered By: Christie Nottingham CMA Duncan Dull) (March 05, 2009 10:27 AM)   Impression & Recommendations:  Problem # 1:  PERSONAL HISTORY OF COLONIC POLYPS (ICD-V12.72) Plan followup colonoscopy in 5 years  Problem # 2:  RECTAL BLEEDING (ICD-569.3) Probably secondary to her polyp or possibly from hemorrhoids.  Recommendations #1 hemorrhoidal suppositories for recurrent bleeding

## 2010-03-14 NOTE — Progress Notes (Signed)
Summary: RX  Phone Note Call from Patient Call back at [facility] Phone [phone]   Caller: Patient Summary of Call: PT WOULD LIKE FOR [place] TO CALL INTO [facility] A SCRIPT DO NOT FAX IT ---VALTREX 500 MG 30 DAY SUPPLY NEED THIS TODAY TO BE CALL IN. Initial call taken by: Freddy Jaksch,  August 10, 2009 3:18 PM  Follow-up for Phone Call        Pt is aware that we called in Acylovir 400 mg to the pharmacy.     New/Updated Medications: ACYCLOVIR 400 MG TABS (ACYCLOVIR) 1 by mouth two times a day as needed Prescriptions: ACYCLOVIR 400 MG TABS (ACYCLOVIR) 1 by mouth two times a day as needed  #60 x 0   Entered by:   Army Fossa CMA   Authorized by:   Nolon Rod. Paz MD   Signed by:   Army Fossa CMA on 08/10/2009   Method used:   Telephoned to ...       [facility] [address] (retail)       [phone]       [place], [place]  [address]       Ph: [phone]       Fax: [phone]   RxID:   [id number]

## 2010-03-14 NOTE — Letter (Signed)
Summary: cystoscopy ---[facility]  [facility]   Imported By: Lanelle Bal 09/14/2009 10:16:27  _____________________________________________________________________  External Attachment:    Type:   Image     Comment:   External Document

## 2010-03-14 NOTE — Assessment & Plan Note (Signed)
Summary: cpx/fasting/kdc--Rm 11   Vital Signs:  Patient profile:   50 year old female Height:      67.75 inches Weight:      143.01 pounds BMI:     21.98 Temp:     98.4 degrees F oral Pulse rate:   72 / minute Pulse rhythm:   regular Resp:     16 per minute BP sitting:   120 / 90  (left arm) Cuff size:   regular  Vitals Entered By: Mervin Kung CMA (August 03, 2009 9:56 AM) Is Patient Diabetic? No Comments Pt states she has been taking BC or Goody Powders as needed for headache.  Pt agrees all other medication doses and directions are correct.     History of Present Illness: complete physical exam  ambulatory BPs 134/84 in the last few days but they are usually lower , in the 120s c/o mild-dull- daily  HA that decreases w/ "BC powders" x few months  wonders if HA related to stress at work  wich "way be increasing my BP as well" HA is mostly  the days she works   [employer]  Alcohol-Tobacco     Alcohol drinks/day: <1     Smoking Status: current     Packs/Day: 0.25  Caffeine-Diet-Exercise     Caffeine use/day: none     Does Patient Exercise: no  Allergies: 1)  ! * Latex  Past History:  Past Medical History: left calf-leg  is larger than the right leg 4 years (circumferentially 1 inch larger) Hyperlipidemia G2 P1 Ab1 h/o hematuria insomnia  OAB HSV Diverticulosis Hemorrhoids  Past Surgical History: Reviewed history from 01/21/2008 and no changes required. 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  started at age 89.  1/3  ppd.  Alcohol  Use - no Illicit Drug Use - no Packs/Day:  0.25 Caffeine use/day:  none Does Patient Exercise:  no  Review of Systems CV:  Denies chest pain or discomfort and swelling of feet. Resp:  Denies cough and wheezing. GI:  Denies bloody stools, diarrhea, nausea, and vomiting. GU:  had a UA at a UC, was told she had some blood in the urine but no infection . Psych:  ++ stress at work, see HPI on  SSRIs, anxiety well controlled? sleeps better w/ SSRIs and xanax  mood not depressed  .  Physical Exam  General:  alert, well-developed, and well-nourished.   Neck:  no masses, no thyromegaly, and normal carotid upstroke.   Lungs:  normal respiratory effort, no intercostal retractions, no accessory muscle use, and normal breath sounds.   Heart:  normal rate, regular rhythm, and no murmur.   Abdomen:  soft, non-tender, no distention, no guarding, and no rigidity.   Extremities:  no edema Left leg at the calf is larger than the right for about the inch, his is not  a new finding Neurologic:  alert & oriented X3, cranial nerves II-XII intact, strength normal in all extremities, and gait normal.   Psych:  Cognition and judgment appear intact. Alert and cooperative with normal attention span and concentration.  not anxious  appearing and not depressed appearing.     Impression & Recommendations:  Problem # 1:  HEALTH SCREENING (ICD-V70.0) Td 06 saw gynecology in October 2010 last mammogram January 2011, negative Colonoscopy November 2010: Polyps, diverticula, hemorrhoids. Next colonoscopy ----November 2015 discussed diet and exercise Labs  Orders: Venipuncture (04540) TLB-Lipid Panel (80061-LIPID) TLB-IBC Pnl (Iron/FE;Transferrin) (83550-IBC) TLB-Hemoglobin (Hgb) (85018-HGB) TLB-B12 + Folate Pnl (82746_82607-B12/FOL)  Problem # 2:  INSOMNIA-SLEEP DISORDER-UNSPEC (ICD-307.40) relatively well controlled with SSRI and Xanax, seen next  Problem # 3:  ANXIETY (ICD-300.00) patient is under a  lot of stress at work, she has  frecuent  headaches mostly the days  she goes to work. HA  is likely a tension headache Plan: Recommend counseling if the patient is willing  Increase citalopram to 30 mg daily reassess in 3 months Her updated medication list for this problem includes:    Xanax 0.5 Mg Tabs (Alprazolam) .Marland Kitchen... 2 by mouth at bedtime fax [phone]    Citalopram Hydrobromide 20 Mg Tabs (Citalopram hydrobromide) .Marland Kitchen... 1.5  by mouth once daily  Problem # 4:  WEIGHT LOSS (ICD-783.21) she has lost a couple pounds lately according to our scales but she looks and feels  healthy Labs    Complete Medication List: 1)  Xanax 0.5 Mg Tabs (Alprazolam) .... 2 by mouth at bedtime fax [phone] 2)  Acyclovir 400 Mg Tabs (Acyclovir) .Marland Kitchen.. 1 by mouth two times a day as needed 3)  Citalopram Hydrobromide 20 Mg Tabs (Citalopram hydrobromide) .... 1.5  by mouth once daily  Patient Instructions: 1)  Please schedule a follow-up appointment in 3 months .  Prescriptions: CITALOPRAM HYDROBROMIDE 20 MG TABS (CITALOPRAM HYDROBROMIDE) 1.5  by mouth once daily  #45 x 6   Entered and Authorized by:   Elita Quick E. Khadeeja Elden MD   Signed by:   Nolon Rod. Zonnique Norkus MD on 08/03/2009   Method used:   Electronically to        [facility] [address] (retail)       [phone]       [place], [place]  [address]       Ph: [phone]       Fax: [phone]   RxID:   [id number]    Immunization History:  Tetanus/Td Immunization History:    Tetanus/Td:  historical (07/11/2004)    Vital Signs:  Patient Profile:   50 year old female Height:     67.75 inches Weight:      143.01 pounds BMI:     21.98 Temp:     98.4 degrees F oral Pulse rate:   72 / minute Pulse rhythm:   regular Resp:     16 per minute BP sitting:   120 / 90 Cuff size:   regular                  Current Allergies (reviewed today): ! * LATEX

## 2010-03-14 NOTE — Medication Information (Signed)
Summary: Quantity Limit Exception Form/[employer]  Quantity Limit Exception Form/[employer]   Imported By: Lanelle Bal 01/17/2010 14:19:24  _____________________________________________________________________  External Attachment:    Type:   Image     Comment:   External Document

## 2010-03-14 NOTE — Progress Notes (Signed)
Summary: urine cx  Phone Note Outgoing Call   Call placed by: Doristine Devoid,  August 21, 2009 8:50 AM Call placed to: Patient Summary of Call: pt's urine is not a clean catch due to the large number of skin cells seen.  yeast is most likely a vaginal contaminent.  start Diflucan 150mg  x1 dose.  may repeat in 3 days if sxs continue.  #2.  no refills.  Follow-up for Phone Call        left message on machine .............Marland KitchenDoristine Devoid  August 21, 2009 8:51 AM   spoke w/ patient aware of urine cx informed medication to be called  and says that her urine has cleared up and has appt w/ urologist tomorrow to f/u on. also wanted to let Dr. Drue Novel know that she has change her diet no longer eating fried foods and working on diet and exercise really hard....Marland KitchenMarland KitchenDoristine Devoid  August 21, 2009 11:00 AM     New/Updated Medications: DIFLUCAN 150 MG TABS (FLUCONAZOLE) take one tablet may repeat in 3 days if symptoms persist Prescriptions: DIFLUCAN 150 MG TABS (FLUCONAZOLE) take one tablet may repeat in 3 days if symptoms persist  #2 x 0   Entered by:   Doristine Devoid   Authorized by:   Neena Rhymes MD   Signed by:   Doristine Devoid on 08/21/2009   Method used:   Electronically to        [facility] [address] (retail)       [phone]       [place], [place]  [address]       Ph: [phone]       Fax: [phone]   RxID:   [id number]

## 2010-03-14 NOTE — Assessment & Plan Note (Signed)
Summary: ? yeast infection//lch   Vital Signs:  Patient profile:   50 year old female Weight:      142 pounds Pulse rate:   58 / minute BP sitting:   100 / 60  (left arm)  Vitals Entered By: Doristine Devoid CMA (September 14, 2009 1:42 PM) CC: yeast infection and some odor x2 days    History of Present Illness: 50 yo woman here today for vaginal odor.  sxs started 2 days ago.  no discharge.  reports odor is 'a strong vagina odor'.  took the diflucan a little over a week ago.  saw the urologist 1 week ago.  Allergies (verified): 1)  ! * Latex  Review of Systems      See HPI  Physical Exam  General:  alert, well-developed, and well-nourished.   Genitalia:  normal introitus, no external lesions, no vaginal or cervical lesions, and thin, foul smelling vaginal discharge.     Impression & Recommendations:  Problem # 1:  VAGINITIS (ICD-616.10) Assessment New  pt w/ BV on wet prep.  start Flagyl. Her updated medication list for this problem includes:    Metronidazole 500 Mg Tabs (Metronidazole) .Marland Kitchen... 1 tab by mouth two times a day x7 days  Orders: Wet Prep (16109UE)  Complete Medication List: 1)  Xanax 0.5 Mg Tabs (Alprazolam) .... 2 by mouth at bedtime fax [phone] 2)  Acyclovir 400 Mg Tabs (Acyclovir) .Marland Kitchen.. 1 by mouth two times a day as needed 3)  Citalopram Hydrobromide 20 Mg Tabs (Citalopram hydrobromide) .... 1.5  by mouth once daily 4)  Simvastatin 20 Mg Tabs (Simvastatin) .Marland Kitchen.. 1 by mouth at bedtime. 5)  Diflucan 150 Mg Tabs (Fluconazole) .... Take one tablet may repeat in 3 days if symptoms persist 6)  Metronidazole 500 Mg Tabs (Metronidazole) .Marland Kitchen.. 1 tab by mouth two times a day x7 days 7)  Physical Exam  .... Ms meaghann choo had her complete physical on 08/03/2009  Patient Instructions: 1)  You have BV- this is not a sexually transmitted infection but does need antibiotics 2)  Take the Metronidazole as directed- do not drink alcohol with this medication.  it will make  you sick 3)  Call with any questions or concerns 4)  Have a great weekend! Prescriptions: PHYSICAL EXAM Ms Terianne Thaker had her complete physical on 08/03/2009  #1 x 0   Entered and Authorized by:   Neena Rhymes MD   Signed by:   Neena Rhymes MD on 09/14/2009   Method used:   Print then Give to Patient   RxID:   [id number] METRONIDAZOLE 500 MG  TABS (METRONIDAZOLE) 1 tab by mouth two times a day x7 days  #14 x 0   Entered and Authorized by:   Neena Rhymes MD   Signed by:   Neena Rhymes MD on 09/14/2009   Method used:   Electronically to        [facility] [address] (retail)       [phone]       [place], [place]  [address]       Ph: [phone]       Fax: [phone]   RxID:   [id number]   Laboratory Results    Wet Mount/KOH WBC/hpf 5-10 Bacteria/hpf 3+  Cocci Clue cells/hpf many  Positive whiff Yeast/hpf none Trichomonas/hpf none

## 2010-03-14 NOTE — Assessment & Plan Note (Signed)
Summary: congested/cbs   Vital Signs:  Patient profile:   50 year old female Weight:      145.50 pounds Temp:     100.2 degrees F oral Pulse rate:   78 / minute Pulse rhythm:   regular BP sitting:   110 / 68  (left arm) Cuff size:   regular  Vitals Entered By: Army Fossa CMA (November 15, 2009 8:58 AM) CC: Pt here c/o nasal and chest congestion  Comments Has tried [employer] and sudafed [facility] [address].   History of Present Illness: as above Now is having postnasal dripping, chest congestion  Current Medications (verified): 1)  Xanax 0.5 Mg  Tabs (Alprazolam) .... 2 By Mouth At Bedtime Fax [phone] 2)  Acyclovir 400 Mg Tabs (Acyclovir) .Marland Kitchen.. 1 By Mouth Two Times A Day As Needed 3)  Citalopram Hydrobromide 20 Mg Tabs (Citalopram Hydrobromide) .... 1.5  By Mouth Once Daily 4)  Simvastatin 20 Mg Tabs (Simvastatin) .Marland Kitchen.. 1 By Mouth At Bedtime.  Allergies: 1)  ! * Latex  Past History:  Past Medical History: left calf-leg  is larger than the right leg  for  years (circumferentially 1 inch larger) Hyperlipidemia G2 P1 Ab1 h/o hematuria insomnia  OAB HSV Diverticulosis Hemorrhoids  Past Surgical History: Reviewed history from 01/21/2008 and no changes required. 1990 - "tummy tuck" 5th toes resected bilat Hysterectomy (1984)- has 1 ovary Caesarean section breast bx (RT) 2001 lumpectomy (RT) 2005  bladder cystoscopy  Social History: Reviewed history from 08/03/2009 and no changes required. 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  started at age 35.  1/3  ppd.  Alcohol Use - no Illicit Drug Use - no  Review of Systems General:  no fever prior to visit . ENT:  Denies sore throat. CV:  mild cough ,  no sputum but + chest congestion. GI:  Denies diarrhea, nausea, and vomiting. MS:  Denies muscle aches.  Physical Exam  General:  alert and well-developed.   Head:  face symmetric Ears:  R ear  normal and L ear normal.   Nose:  congestive Mouth:  slightly red, no discharge Lungs:  no intercostal retractions and no accessory muscle use.  large airway congestion and some rhonchi. No crackles or wheezing Heart:  normal rate, regular rhythm, and no murmur.     Impression & Recommendations:  Problem # 1:  BRONCHITIS- ACUTE (ICD-466.0) see instructions     Complete Medication List: 1)  Xanax 0.5 Mg Tabs (Alprazolam) .... 2 by mouth at bedtime fax [phone] 2)  Acyclovir 400 Mg Tabs (Acyclovir) .Marland Kitchen.. 1 by mouth two times a day as needed 3)  Citalopram Hydrobromide 20 Mg Tabs (Citalopram hydrobromide) .... 1.5  by mouth once daily 4)  Simvastatin 20 Mg Tabs (Simvastatin) .Marland Kitchen.. 1 by mouth at bedtime. 5)  Zithromax Z-pak 250 Mg Tabs (Azithromycin) .... As directed  Patient Instructions: 1)  rest, fluids, Tylenol 2)  Robitussin DM 4 times a day for one week, Sudafed as needed 3)  Start Zithromax as prescribed 4)  Come back in few days if no better, call if you get worse Prescriptions: ZITHROMAX Z-PAK 250 MG TABS (AZITHROMYCIN) as directed  #1 x 0   Entered and Authorized by:   Nolon Rod. Felix Pratt MD   Signed by:   Nolon Rod. Omaira Mellen MD on 11/15/2009   Method used:   Electronically to        [facility] [address] (  retail)       [phone]       [place], [place]  [address]       Ph: [phone]       Fax: [phone]   RxID:   [id number]

## 2010-03-14 NOTE — Progress Notes (Signed)
Summary: --has "odor"  Phone Note Call from Patient Call back at Home Phone [phone]   Caller: Patient Summary of Call: saw Dr Beverely Low 11/22 for cold, cough and Lower body "odor"---had to go to urgent care for cold and cough recently  b/c she was still sick and now "odor" is back (no discharge) ---can a prescription for the odor be [facility] [address]?    Please call her at [phone] Initial call taken by: Jerolyn Shin,  January 24, 2010 12:53 PM  Follow-up for Phone Call        Pls advise............Marland KitchenFelecia Deloach CMA  January 24, 2010 3:00 PM   Additional Follow-up for Phone Call Additional follow up Details #1::        ok for flagyl 500mg  by mouth two times a day x7 days Additional Follow-up by: Neena Rhymes MD,  January 24, 2010 3:02 PM    Additional Follow-up for Phone Call Additional follow up Details #2::    Left message to call office............Marland KitchenFelecia Deloach CMA  January 24, 2010 3:08 PM  Pt aware Rx sent to pharmacy...........Marland KitchenFelecia Deloach CMA  January 24, 2010 3:34 PM   New/Updated Medications: FLAGYL 500 MG TABS (METRONIDAZOLE) Take 1 by mouth two times a day x7 days Prescriptions: FLAGYL 500 MG TABS (METRONIDAZOLE) Take 1 by mouth two times a day x7 days  #14 x 0   Entered by:   Jeremy Johann CMA   Authorized by:   Neena Rhymes MD   Signed by:   Jeremy Johann CMA on 01/24/2010   Method used:   Faxed to ...       [facility] [address] (retail)       [phone]       [place], [place]  [address]       Ph: [phone]       Fax: [phone]   RxID:   [id number]

## 2010-03-14 NOTE — Progress Notes (Signed)
Summary: wants to start cholesterol med again  Phone Note Call from Patient Call back at Home Phone [phone]   Caller: Patient Summary of Call: patient  stopped taking cholesterol med because lab result was goood - she changed eating habits & started walking - she said she is getting headaches & bp was 134/84 - she wants to start taking med again - wants to know if she should tak in the am or pm   Initial call taken by: Okey Regal [place],  October 12, 2009 1:28 PM  Follow-up for Phone Call        is better to take it at bedtime Follow-up by: [facility] E. Paz MD,  October 12, 2009 2:21 PM  Additional Follow-up for Phone Call Additional follow up Details #1::        Pt is aware. Army Fossa CMA  October 12, 2009 2:36 PM

## 2010-03-14 NOTE — Medication Information (Signed)
Summary: Formulary Letter Regarding Citalopram/[employer]  Formulary Letter Regarding Citalopram/[employer]   Imported By: Lanelle Bal 12/11/2009 10:21:09  _____________________________________________________________________  External Attachment:    Type:   Image     Comment:   External Document

## 2010-03-14 NOTE — Assessment & Plan Note (Signed)
Summary: 3 mth fu/ns/kdc   Vital Signs:  Patient profile:   50 year old female Height:      67.75 inches Weight:      146.2 pounds BMI:     22.47 Pulse rate:   70 / minute BP sitting:   120 / 78  Vitals Entered By: Shary Decamp (March 23, 2009 10:16 AM) CC: rov   History of Present Illness: follow-up from the last office visit overall feels better  Current Medications (verified): 1)  Xanax 0.5 Mg  Tabs (Alprazolam) .... 2 By Mouth At Bedtime Fax [phone] 2)  Acyclovir 400 Mg Tabs (Acyclovir) .Marland Kitchen.. 1 By Mouth Two Times A Day As Needed 3)  Citalopram Hydrobromide 20 Mg Tabs (Citalopram Hydrobromide) .Marland Kitchen.. 1 By Mouth Once Daily 4)  Detrol La 4 Mg Xr24h-Cap (Tolterodine Tartrate) .Marland Kitchen.. 1 By Mouth Once Daily (Patient Will Start-Near Future)  Allergies (verified): 1)  ! * Latex  Past History:  Past Medical History:      Hyperlipidemia       G2 P1 Ab1       h/o hematuria       insomnia       OAB       HSV       Diverticulosis       Hemorrhoids  Past Surgical History: Reviewed history from 01/21/2008 and no changes required. 1990 - "tummy tuck" 5th toes resected bilat Hysterectomy (1984)- has 1 ovary Caesarean section breast bx (RT) 2001 lumpectomy (RT) 2005  bladder cystoscopy  Social History: Reviewed history from 12/29/2008 and no changes required. 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  started at age 31.  1/2 ppd.  Alcohol Use - no Illicit Drug Use - no  Review of Systems       since the last office visit she is sleeping better she talked with her gynecologist, they recommend not to start HRT her over active bladder symptoms are better, she quit Detrol her appetite is better, although here in the office our scales indicate she has lost 2 pounds, she thinks she has actually gained weight denies anxiety or depression per se, she however thinks that she may have "communications skill issues",  would like to see a counselor once her insurance is better General:  Denies chills and fever.  Physical Exam  General:  alert, well-developed, and well-nourished.   Lungs:  normal respiratory effort, no intercostal retractions, and no accessory muscle use.   Heart:  normal rate, regular rhythm, and no murmur.   Extremities:  no edema Psych:  not anxious appearing and not depressed appearing.     Impression & Recommendations:  Problem # 1:  WEIGHT LOSS (ICD-783.21)  per  our scales she has lost 2 pounds she feels that she has actually gained some weight labs  Orders: Venipuncture (45409) TLB-BMP (Basic Metabolic Panel-BMET) (80048-METABOL) TLB-CBC Platelet - w/Differential (85025-CBCD) TLB-TSH (Thyroid Stimulating Hormone) (84443-TSH)  Problem # 2:  PERSISTENT DISORDER INITIATING/MAINTAINING SLEEP (ICD-307.42) overall sleeping better with alprazolam and citalopram  Problem # 3:  ? of OVERACTIVE BLADDER (ICD-596.51) essentially asymptomatic, she self discontinued Detrol  Complete Medication List: 1)  Xanax 0.5 Mg Tabs (Alprazolam) .... 2 by mouth at bedtime fax [phone] 2)  Acyclovir 400 Mg Tabs (Acyclovir) .Marland Kitchen.. 1 by mouth two times a day as needed 3)  Citalopram Hydrobromide 20 Mg Tabs (Citalopram hydrobromide) .Marland Kitchen.. 1 by mouth once daily  Patient Instructions: 1)  Please schedule a follow-up appointment in 4 months (fasting, physical exam)  Prescriptions: ACYCLOVIR 400 MG TABS (ACYCLOVIR) 1 by mouth two times a day as needed  #60 x 3   Entered and Authorized by:   Elita Quick E. Cayetano Mikita MD   Signed by:   Nolon Rod. Sergey Ishler MD on 03/23/2009   Method used:   Electronically to        [facility] [address] (retail)       [phone]       [place], [place]  [address]       Ph: [phone]       Fax: [phone]   RxID:   [id number]

## 2010-03-14 NOTE — Progress Notes (Signed)
Summary: Quantity override--Citalopram  Phone Note Call from Patient Call back at Home Phone [phone]   Caller: Patient Summary of Call: See copy of letter (phone note dated 11/21/2009) about quantity of medication approved---patient states that she needs 45 per month and prescription is only approved for 30---Can something be sent to her insurance company or a phone request to increase her qty from 30 to 45? Initial call taken by: Jerolyn Shin,  December 31, 2009 5:10 PM  Follow-up for Phone Call        Form requested. Lucious Groves CMA  January 01, 2010 8:31 AM   Forms requested again. Lucious Groves CMA  January 07, 2010 3:21 PM   Forms completed and faxed, will await insurance company reply. Lucious Groves CMA  January 07, 2010 3:54 PM      Appended Document: Quantity override--Citalopram Has been denied, see phone note.

## 2010-03-14 NOTE — Letter (Signed)
Summary: hematuria workup, to have cystoscopy----[facility]   Imported By: Lanelle Bal 08/30/2009 12:58:43  _____________________________________________________________________  External Attachment:    Type:   Image     Comment:   External Document

## 2010-04-25 ENCOUNTER — Telehealth: Payer: Self-pay | Admitting: Internal Medicine

## 2010-04-30 NOTE — Progress Notes (Signed)
Summary: Acyclovir, Simvastatin, Diflucan refills  Phone Note Refill Request Message from:  Patient on April 25, 2010 9:27 AM  Refills Requested: Medication #1:  DIFLUCAN patient wants :Erin Fox, [place], NOT  [place] (see earlier phone note)----also says she needs Diflucan due to symptoms "starting"--has a little odor  Next Appointment Scheduled: none Initial call taken by: Jerolyn Shin,  April 25, 2010 9:37 AM  Follow-up for Phone Call        Left message for pt- was going to offer appt for Dificlulan- left message. Please advise. Army Fossa CMA  April 25, 2010 9:40 AM   Additional Follow-up for Phone Call Additional follow up Details #1::        ok simva 30, 2 Rf ok acyclovir #60, 2 RF no diflucan, OTCs , if no better OV needs  ROV Johna Kearl E. Edahi Kroening MD  April 25, 2010 1:20 PM     Additional Follow-up for Phone Call Additional follow up Details #2::    Left message for pt to call back.   Additional Follow-up for Phone Call Additional follow up Details #3:: Details for Additional Follow-up Action Taken: I spoke w/ pt she will try OTCs and call to make an appt. Army Fossa CMA  April 25, 2010 2:25 PM   fPrescriptions: SIMVASTATIN 20 MG TABS (SIMVASTATIN) 1 by mouth at bedtime. DUE FOR OFFICE VISIT.  #30 x 1   Entered by:   Army Fossa CMA   Authorized by:   Nolon Rod. Coltyn Hanning MD   Signed by:   Army Fossa CMA on 04/25/2010   Method used:   Electronically to        [facility] (retail)       [phone]       [place], [place]  [address]       Ph: [phone]       Fax: [phone]   RxID:   [id number] ACYCLOVIR 400 MG TABS (ACYCLOVIR) 1 by mouth two times a day as needed  #60 x 2   Entered by:   Army Fossa CMA   Authorized by:   Nolon Rod. Hazim Treadway MD   Signed by:   Army Fossa CMA on 04/25/2010   Method used:   Electronically to        [facility] (retail)       [phone]       [place], [place]  [address]       Ph: [phone]       Fax: [phone]   RxID:   [id number]

## 2010-05-10 ENCOUNTER — Other Ambulatory Visit: Payer: Self-pay | Admitting: Internal Medicine

## 2010-05-10 ENCOUNTER — Telehealth: Payer: Self-pay | Admitting: Internal Medicine

## 2010-05-10 NOTE — Telephone Encounter (Signed)
error 

## 2010-06-28 NOTE — Op Note (Signed)
Erin Fox, SUTTER NO.:  [id number]   MEDICAL RECORD NO.:  [id number]          PATIENT TYPE:  AMB   LOCATION:  [facility]                         FACILITY:  [facility]   PHYSICIAN:  [place] [employer], M.D.DATE OF BIRTH:  Dec 25, 1960   DATE OF PROCEDURE:  08/27/2004  DATE OF DISCHARGE:                                 OPERATIVE REPORT   Reference is made office notes from July 18, 2004 to March 2006, March 08, 2004, as well as CAT scan from March 12, 2004.   PREOPERATIVE DIAGNOSIS:  The patient seen initially on referral from Dr.  Drue Novel.  Fiberoptic cystoscopy showed a normal urethra and sphincter.  Clear  reflux right ureteral orifice.  There was an area of erythema around the  left ureteral orifice.  The remainder of the bladder appeared normal as did  the CAT scan.  Repeat cystoscopy June 2006 after gynecologic evaluation with  Dr. Su Hilt showed continued area of edema and erythema surrounding the left  ureteral orifice.  Plans made for cystoscopy, retrogrades, ureteroscopy, and  biopsy.   POSTOPERATIVE DIAGNOSES:  The patient seen initially on referral from Dr.  Drue Novel.  Fiberoptic cystoscopy showed a normal urethra and sphincter.  Clear  reflux right ureteral orifice.  There was an area of erythema around the  left ureteral orifice.  The remainder of the bladder appeared normal as did  the CAT scan.  Repeat cystoscopy June 2006 after gynecologic evaluation with  Dr. Su Hilt showed continued area of edema and erythema surrounding the left  ureteral orifice.  Plans made for cystoscopy, retrogrades, ureteroscopy, and  biopsy.   PROCEDURE:  Cystoscopy, retrogrades, left ureteroscopy, and biopsy.   ANESTHESIA:  General.   DRAINS:  20-French silicone catheter.   SPECIMENS:  Deep and superficial bladder biopsies.   DESCRIPTION OF PROCEDURE:  The patient was prepped and draped in the  dorsolithotomy position after institution of an adequate level of general  anesthesia.  Well-lubricated 21-French panendoscope was gently inserted at  the urethral meatus.  Normal urethra and sphincter.  Trigone showed edema  and erythema around the left ureteral orifice.  Normal right ureteral  orifice.  Clear reflux on both sides.  Bladder was carefully inspected with  the 12 and 70-degree lenses.   Blocking catheter was selected for retrogrades, placed at the right ureteral  orifice and then at the left ureteral orifice.  Identical technique was  used; 3-5 mL of contrast were injected.  Path of the ureter was identified  without filling defect or obstruction.  Additional 2-3 mL of contrast were  injected, pelvis and calyces identified with prompt drainage at 3-5 minutes.   Floppy tip guidewire was then inserted at the left ureteral orifice.  Short  6-French ureteroscope was then inserted, passed without difficulty to the  limit of the short 6-French scope at a spot just above the vessels.  No  obvious evidence of intraureteral pathology was identified.  Ureteroscope  was withdrawn; 6-French end-hole catheter was placed at the left ureteral  orifice.  Biopsies were taken in the erythematous tissue around the left  ureteral orifice  medially, posteriorly and laterally.  Once adequate  sampling had been obtained, biopsy forceps were removed and replaced with  the VaporTrode element on a low setting.  Adequate hemostasis was achieved.  Ureteral catheter was  withdrawn with clear reflux at the left ureteral orifice.  The 20-French  silicone catheter was inserted, bladder irrigated easily; catheter was left  to straight drain, and patient was returned to recovery in satisfactory  condition after having been given a B&O suppository.       RH/MEDQ  D:  08/27/2004  T:  08/27/2004  Job:  [id number]   cc:   Wanda Plump, MD [facility]  [phone] [place], [place] [address]   Osborn Coho, M.D.  Fax: [phone]

## 2010-06-28 NOTE — Op Note (Signed)
NAME:  Erin Fox, Erin Fox NO.:  [id number]   MEDICAL RECORD NO.:  [id number]           PATIENT TYPE:   LOCATION:                                FACILITY:  [facility]   PHYSICIAN:  Osborn Coho, M.D.   DATE OF BIRTH:  [birth year]   DATE OF PROCEDURE:  DATE OF DISCHARGE:                                 OPERATIVE REPORT   PREOPERATIVE DIAGNOSES:  1. Bartholin's cyst.  2. Enlarged left labia.     POSTOPERATIVE DIAGNOSES:  1. Bartholin's cyst.  2. Enlarged left labia.     PROCEDURES:  1. Removal of Bartholin's cyst/gland.  2. Revision of left labia.     ATTENDING:  Osborn Coho, M.D.   ANESTHESIA:  General.   ESTIMATED BLOOD LOSS:  Approximately 500 mL, consistent with postop CBC, EBL  per anesthesia overestimated.   SPECIMEN TO PATHOLOGY:  Bartholin's cyst/gland.   URINE OUTPUT:  Approximately 300 mL as the patient was straight-catheterized  prior to the procedure and during procedure.   PROCEDURE:  The patient was taken to the operating room after the risks,  benefits, and alternatives were discussed with the patient, the patient  verbalized understanding and consent signed and witnessed.  The patient was  placed under a general per anesthesia and prepped and draped in the normal  sterile fashion in a dorsal lithotomy position.  The Bartholin's cyst was  identified and at the left vaginal introitus where a prior incision had been  made, the Bartholin's cyst/gland was excised.  There was a deep pocket in  this area where previous surgery had been performed.  I attempted to remove  the gland as well as the cyst.  The significant portion of blood loss  occurred during the removal of the gland/cyst.  The pocket was then closed  using 0 Vicryl via interrupteds and figure-of-eights.  This was done to the  level of the superficial area of the left vaginal introitus.  The skin was  closed with 2-0 Vicryl using a subcuticular stitch.  Attention was then  turned  to the left labia.  In the fold of the left labia an incision was  performed along the inner length of the labia and the skin undermined in  order to dissect away the underlying tissue.  Significant varicosities were  noted, which were then ligated, cauterized and excised.  A 0.5 cm in width  and approximately 3-4 cm in length portion of excess skin was excised.  The  skin was then reapproximated using 2-0 Vicryl via a subcuticular stitch.  Sponge, lap and needle count were correct.  The patient tolerated the  procedure well and was returned to the recovery room in good condition.      AR/MEDQ  D:  06/10/2004  T:  06/10/2004  Job:  [id number]

## 2010-06-28 NOTE — Op Note (Signed)
NAME:  Erin Fox, Erin Fox NO.:  [id number]   MEDICAL RECORD NO.:  [id number]          PATIENT TYPE:  AMB   LOCATION:  [facility]                          FACILITY:  [facility]   PHYSICIAN:  Lorre Munroe., M.D.DATE OF BIRTH:  1960/12/02   DATE OF PROCEDURE:  01/02/2004  DATE OF DISCHARGE:                                 OPERATIVE REPORT   PREOPERATIVE DIAGNOSIS:  Right breast mass.   POSTOPERATIVE DIAGNOSIS:  Right breast mass.   OPERATION PERFORMED:  Right breast biopsy.   SURGEON:  Lebron Conners, M.D.   ANESTHESIA:  General.   INDICATIONS FOR PROCEDURE:  The patient is a healthy 50 year old black  female who has had a longstanding right breast mass.  She had been reassured  that it was probably benign, but she thought it had gotten a little larger  and asked me to excise it and I agreed to do so.   DESCRIPTION OF PROCEDURE:  After the patient was monitored and anesthetized  and had routine preparation and draping of the lateral aspect of the right  breast, I pinned the mass between my fingers and made a small incision about  2 cm in length right over the mass.  Using the Bovie, I dissected down to  it, then staying very close to the mass, separated it from the surrounding  normal breast gland.  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  After  excising the mass entirely, I got hemostasis with the cautery and closed the  skin with intracuticular 4-0 Vicryl and Steri-Strips and applied a small  bandage.  The patient tolerated the operation well.      Will   WB/MEDQ  D:  01/02/2004  T:  01/02/2004  Job:  [id number]   cc:   Wanda Plump, MD [facility]  [phone] [address] [place], [place] [address]

## 2010-07-19 ENCOUNTER — Other Ambulatory Visit: Payer: Self-pay | Admitting: Internal Medicine

## 2010-07-22 ENCOUNTER — Other Ambulatory Visit: Payer: Self-pay | Admitting: Internal Medicine

## 2010-07-22 MED ORDER — ALPRAZOLAM 0.5 MG PO TABS: 1.0000 mg | ORAL_TABLET | Freq: Every evening | ORAL | Status: DC | PRN

## 2010-07-22 NOTE — Telephone Encounter (Signed)
Okay to refill xanax. 

## 2010-07-22 NOTE — Telephone Encounter (Signed)
60, no RF  Advise patient: Due for OV

## 2010-07-22 NOTE — Telephone Encounter (Signed)
Refill zanax - [facility] - [place]   - patient said she called pharmacy - we didn't receive request

## 2010-07-22 NOTE — Telephone Encounter (Signed)
duplicate

## 2010-07-25 NOTE — Telephone Encounter (Signed)
lmom for patient to call & schedule appt - mailed letter

## 2010-07-29 ENCOUNTER — Other Ambulatory Visit: Payer: Self-pay | Admitting: Internal Medicine

## 2010-07-30 ENCOUNTER — Encounter: Payer: Self-pay | Admitting: Internal Medicine

## 2010-07-30 ENCOUNTER — Telehealth: Payer: Self-pay | Admitting: *Deleted

## 2010-07-30 NOTE — Telephone Encounter (Signed)
Pt is due for appointment w/ Dr. Drue Novel.

## 2010-08-02 NOTE — Telephone Encounter (Signed)
Left message on cell phone to call and schedule followup appt with Dr Velvet Bathe number needs extension--tried to find her on directory--name not listed

## 2010-08-19 ENCOUNTER — Other Ambulatory Visit: Payer: Self-pay | Admitting: Internal Medicine

## 2010-08-20 NOTE — Telephone Encounter (Signed)
Ok 60, 1 RF. Needs OV Before next RF, let pt know

## 2010-08-22 ENCOUNTER — Encounter: Payer: Self-pay | Admitting: Internal Medicine

## 2010-08-22 NOTE — Telephone Encounter (Signed)
Sent letter to call and make followup appt

## 2010-08-26 ENCOUNTER — Other Ambulatory Visit: Payer: Self-pay | Admitting: Internal Medicine

## 2010-09-02 ENCOUNTER — Other Ambulatory Visit: Payer: Self-pay | Admitting: Internal Medicine

## 2010-09-02 DIAGNOSIS — Z1231 Encounter for screening mammogram for malignant neoplasm of breast: Secondary | ICD-10-CM

## 2010-09-04 NOTE — Telephone Encounter (Signed)
Pt has appt scheduled 10/01/10

## 2010-09-12 ENCOUNTER — Ambulatory Visit
Admission: RE | Admit: 2010-09-12 | Discharge: 2010-09-12 | Disposition: A | Discharge status: Home / Self Care | Payer: PRIVATE HEALTH INSURANCE | Source: Ambulatory Visit | Attending: Internal Medicine | Admitting: Internal Medicine

## 2010-09-12 DIAGNOSIS — Z1231 Encounter for screening mammogram for malignant neoplasm of breast: Secondary | ICD-10-CM

## 2010-09-26 ENCOUNTER — Other Ambulatory Visit: Payer: Self-pay | Admitting: Internal Medicine

## 2010-09-26 NOTE — Telephone Encounter (Signed)
Rx Done . 

## 2010-10-01 ENCOUNTER — Encounter: Payer: Self-pay | Admitting: Internal Medicine

## 2010-10-10 ENCOUNTER — Other Ambulatory Visit: Payer: Self-pay | Admitting: Internal Medicine

## 2010-10-17 ENCOUNTER — Other Ambulatory Visit: Payer: Self-pay | Admitting: Internal Medicine

## 2010-10-18 MED ORDER — ALPRAZOLAM 0.5 MG PO TABS: 1.0000 mg | ORAL_TABLET | Freq: Every evening | ORAL | Status: DC | PRN

## 2010-10-18 NOTE — Telephone Encounter (Signed)
Alprazolam request [last refill 08/19/10 #60x1]

## 2010-10-18 NOTE — Telephone Encounter (Signed)
I noticed the patient has an appointment to see me in 2 weeks, okay to call # 60, no refill

## 2010-10-18 NOTE — Telephone Encounter (Signed)
Rx Done/phoned in.

## 2010-10-22 ENCOUNTER — Other Ambulatory Visit: Payer: Self-pay | Admitting: Internal Medicine

## 2010-10-23 ENCOUNTER — Encounter: Payer: Self-pay | Admitting: Internal Medicine

## 2010-11-04 ENCOUNTER — Encounter: Payer: Self-pay | Admitting: Internal Medicine

## 2010-11-05 ENCOUNTER — Ambulatory Visit (INDEPENDENT_AMBULATORY_CARE_PROVIDER_SITE_OTHER): Payer: PRIVATE HEALTH INSURANCE | Admitting: Internal Medicine

## 2010-11-05 ENCOUNTER — Other Ambulatory Visit (HOSPITAL_COMMUNITY)
Admission: RE | Admit: 2010-11-05 | Discharge: 2010-11-05 | Disposition: A | Discharge status: Home / Self Care | Payer: PRIVATE HEALTH INSURANCE | Source: Ambulatory Visit | Attending: Internal Medicine | Admitting: Internal Medicine

## 2010-11-05 ENCOUNTER — Encounter: Payer: Self-pay | Admitting: *Deleted

## 2010-11-05 ENCOUNTER — Encounter: Payer: Self-pay | Admitting: Internal Medicine

## 2010-11-05 DIAGNOSIS — F411 Generalized anxiety disorder: Secondary | ICD-10-CM

## 2010-11-05 DIAGNOSIS — F519 Sleep disorder not due to a substance or known physiological condition, unspecified: Secondary | ICD-10-CM

## 2010-11-05 DIAGNOSIS — Z01419 Encounter for gynecological examination (general) (routine) without abnormal findings: Secondary | ICD-10-CM | POA: Insufficient documentation

## 2010-11-05 DIAGNOSIS — Z0000 Encounter for general adult medical examination without abnormal findings: Secondary | ICD-10-CM | POA: Insufficient documentation

## 2010-11-05 NOTE — Assessment & Plan Note (Signed)
Symptoms well-controlled, no change 

## 2010-11-05 NOTE — Assessment & Plan Note (Signed)
Unable to sleep without alprazolam, she is concerned but  seems to be doing well CHART REVIEWED  nortriptyline, tenazepam, and  ambien did not help her sleep  I recommend to continue with same regimen for now

## 2010-11-05 NOTE — Progress Notes (Signed)
Subjective:    Patient ID: Erin Fox, female    DOB: 07-17-1960, 50 y.o.   MRN: [id number]  HPI CPX Past Medical History  Diagnosis Date  . Hyperlipidemia   . H/O: hematuria     cysto 08-2009 @ urology  . Insomnia   . OAB (overactive bladder)   . HSV (herpes simplex virus) infection   . Diverticulosis   . Hemorrhoids    Past Surgical History  Procedure Date  . Tummy tuck 1990  . 5th toes resected bilateral   . Abdominal hysterectomy 1984    has one ovary  . Cesarean section   . Breast biopsy 2001    right  . Breast lumpectomy 2005    right  . Cystoscopy w/ dilation of bladder    History   Social History  . Marital Status: Divorced    Spouse Name: N/A    Number of Children: 1  . Years of Education: N/A   Occupational History  . quality technician    Social History Main Topics  . Smoking status: Current Everyday Smoker  . Smokeless tobacco: Never Used   Comment: started at 18 smokes 1/2 ppd  . Alcohol Use: Yes     rarely   . Drug Use: No  . Sexually Active: Not on file   Other Topics Concern  . Not on file   Social History Narrative   Lives by herself, currently living in a hotel-- diet: used to be better ---exercise: less than before     Family History  Problem Relation Age of Onset  . Hyperlipidemia Mother   . Diabetes Father   . Hypertension Mother     M and sister   . Cancer Paternal Aunt     lung  . Colon cancer      uncle , dx at age 49s  . Breast cancer Neg Hx     Review of Systems Anxiety-depression well controlled Concerned because she is unable to sleep unless she takes Xanax. Affecting her memory? She is however doing great in her job, able to multitask, getting a promotion. Denies any nausea, vomiting, diarrhea No doing self breast exams Denies any vaginal discharge or vaginal bleeding. Very rarely has hot flashes. Not taking Flonase daily, allergies well controlled Was seen elsewhere with bronchitis, was prescribed a Z-Pak 3  months ago, still has occasional cough.    Objective:   Physical Exam  Constitutional: She is oriented to person, place, and time. She appears well-developed and well-nourished. No distress.  HENT:  Head: Normocephalic and atraumatic.  Neck: No thyromegaly present.       Normal carotid pulses  Cardiovascular: Normal rate, regular rhythm and normal heart sounds.   No murmur heard. Pulmonary/Chest: Effort normal and breath sounds normal. No respiratory distress. She has no wheezes. She has no rales.  Abdominal: Soft. She exhibits no distension. There is no tenderness. There is no rebound and no guarding.  Genitourinary:       Wrist exam normal bilaterally, no axillary lymphadenopathies. GU: External exam normal Vagina free of discharge of lesions Bimanual exam without mass or tenderness.  Musculoskeletal: She exhibits no edema.       Left calf slightly larger than the right for about 1 cm, a chronic finding according to the patient  Neurological: She is alert and oriented to person, place, and time.  Skin: She is not diaphoretic.  Psychiatric: She has a normal mood and affect. Her behavior is normal. Judgment and thought  content normal.          Assessment & Plan:

## 2010-11-05 NOTE — Patient Instructions (Signed)
Please do her self breast exam routinely

## 2010-11-05 NOTE — Assessment & Plan Note (Addendum)
Td 06 saw gynecology in October 2010 and 2011:last PAP? PAP is done today, next in 2 years Had a  Mammogram 2012 ( per pt): negative Colonoscopy November 2010: Polyps, diverticula, hemorrhoids. Next colonoscopy ----November 2015 discussed quitting tobacco, diet and exercise Labs

## 2010-11-06 LAB — CBC WITH DIFFERENTIAL/PLATELET
Basophils Relative: 0.5 % (ref 0.0–3.0)
Eosinophils Absolute: 0.1 10*3/uL (ref 0.0–0.7)
Eosinophils Relative: 3.3 % (ref 0.0–5.0)
Hemoglobin: 13.5 g/dL (ref 12.0–15.0)
Lymphocytes Relative: 41.7 % (ref 12.0–46.0)
MCHC: 33.3 g/dL (ref 30.0–36.0)
Monocytes Relative: 11.2 % (ref 3.0–12.0)
Neutro Abs: 1.5 10*3/uL (ref 1.4–7.7)
Neutrophils Relative %: 43.3 % (ref 43.0–77.0)
RBC: 4.2 Mil/uL (ref 3.87–5.11)
WBC: 3.4 10*3/uL — ABNORMAL LOW (ref 4.5–10.5)

## 2010-11-06 LAB — TSH: TSH: 2.09 u[IU]/mL (ref 0.35–5.50)

## 2010-11-06 LAB — LIPID PANEL
HDL: 97.1 mg/dL (ref >39.00)
Triglycerides: 50 mg/dL (ref 0.0–149.0)
VLDL: 10 mg/dL (ref 0.0–40.0)

## 2010-11-06 LAB — COMPREHENSIVE METABOLIC PANEL
Albumin: 4.2 g/dL (ref 3.5–5.2)
BUN: 13 mg/dL (ref 6–23)
CO2: 28 mEq/L (ref 19–32)
Calcium: 9.4 mg/dL (ref 8.4–10.5)
Chloride: 110 mEq/L (ref 96–112)
Creatinine, Ser: 0.8 mg/dL (ref 0.4–1.2)
GFR: 104.15 mL/min (ref >60.00)
Glucose, Bld: 77 mg/dL (ref 70–99)
Potassium: 5.5 mEq/L — ABNORMAL HIGH (ref 3.5–5.1)

## 2010-11-08 ENCOUNTER — Other Ambulatory Visit: Payer: Self-pay | Admitting: Internal Medicine

## 2010-11-08 NOTE — Telephone Encounter (Signed)
Done

## 2010-11-18 ENCOUNTER — Other Ambulatory Visit: Payer: Self-pay | Admitting: Internal Medicine

## 2010-11-18 NOTE — Telephone Encounter (Signed)
Ok 60 and 3 RF 

## 2010-11-18 NOTE — Telephone Encounter (Signed)
Done

## 2010-12-08 ENCOUNTER — Other Ambulatory Visit: Payer: Self-pay | Admitting: Internal Medicine

## 2010-12-21 ENCOUNTER — Other Ambulatory Visit: Payer: Self-pay | Admitting: Internal Medicine

## 2011-01-08 ENCOUNTER — Other Ambulatory Visit: Payer: Self-pay | Admitting: Internal Medicine

## 2011-03-18 ENCOUNTER — Other Ambulatory Visit: Payer: Self-pay | Admitting: Internal Medicine

## 2011-03-19 NOTE — Telephone Encounter (Signed)
Ok 60, 3 RF 

## 2011-03-19 NOTE — Telephone Encounter (Signed)
Refill request xanax 0.5mg  #60 with 3 refills. Last refilled 10.8.12. OK to refill?

## 2011-03-19 NOTE — Telephone Encounter (Signed)
Refill done.  

## 2011-04-09 ENCOUNTER — Other Ambulatory Visit: Payer: Self-pay | Admitting: Internal Medicine

## 2011-04-10 NOTE — Telephone Encounter (Signed)
Refill done.  

## 2011-04-30 ENCOUNTER — Telehealth: Payer: Self-pay | Admitting: *Deleted

## 2011-05-06 ENCOUNTER — Telehealth: Payer: Self-pay | Admitting: Internal Medicine

## 2011-05-06 DIAGNOSIS — G4730 Sleep apnea, unspecified: Secondary | ICD-10-CM

## 2011-05-06 NOTE — Telephone Encounter (Signed)
Would you like for me to set up the referral? Please advise.

## 2011-05-06 NOTE — Telephone Encounter (Signed)
LMOVM for pt to return call 

## 2011-05-06 NOTE — Telephone Encounter (Signed)
She has a long history of insomnia, unable to sleep without Xanax. I don't know that a sleep study will help her "come off Xanax". I recommend a visit with one of our sleep medicine doctors, please arrange if the patient is agreeable

## 2011-05-06 NOTE — Telephone Encounter (Signed)
Patient wants to do a sleep study to come off of the xanax. She has already contacted [facility] would like a referral. Patient ph# [phone] Also patient stated she has been following the low sodium diet , taking flax see & BP is normal as of now at 116/78

## 2011-05-07 NOTE — Telephone Encounter (Signed)
Discussed with pt & set up referral to sleep med doctor.

## 2011-05-14 ENCOUNTER — Other Ambulatory Visit: Payer: Self-pay | Admitting: Internal Medicine

## 2011-05-14 NOTE — Telephone Encounter (Signed)
Refill done.  

## 2011-05-20 ENCOUNTER — Encounter: Payer: Self-pay | Admitting: Pulmonary Disease

## 2011-05-20 ENCOUNTER — Ambulatory Visit (INDEPENDENT_AMBULATORY_CARE_PROVIDER_SITE_OTHER): Payer: BC Managed Care – PPO | Admitting: Pulmonary Disease

## 2011-05-20 VITALS — BP 110/82 | HR 73 | Temp 98.6°F | Ht 67.0 in | Wt 148.0 lb

## 2011-05-20 DIAGNOSIS — G4700 Insomnia, unspecified: Secondary | ICD-10-CM

## 2011-05-20 NOTE — Assessment & Plan Note (Signed)
The patient continues to have issues with initiating and maintaining sleep.  I believe these are behavioral in nature, and are worsened by her poor sleep hygiene.  I have again gone over various things to try, but because of her long-standing issue that is clearly deep-seeded, I think she needs to see a behavioral specialist for cognitive behavioral therapy.  I have told her that if she is able to sleep through the night, and has issues with nonrestorative sleep and significant daytime sleepiness, we could then possibly evaluate her for sleep disordered breathing.

## 2011-05-20 NOTE — Patient Instructions (Signed)
1) ritualistic behaviors before going to bed.  2) no tv watching or reading in bed. You should only lie down in bed to sleep at night!!! No napping at any time during the day or night.  3) Get out of bed everyday no later than 9am, whether you have slept one or 8 hrs.  4) Do not stay in bed more than if you cannot initiate sleep. Leave bedroom and go to family room to read or watch tv until sleepy again. May have to do this multiple time at night.  5) I will recommend to Dr. Drue Novel to send you to a psychiatrist or psychologist for "cognitive behavioral therapy" to help you "turn off your brain" at night. 6) regarding your xanax, I agree with coming off this unless you have an anxiety disorder.  This will have to be tapered off, and not stopped all at once.  Would discuss with your primary md how to do this.  7) try melatonin 3mg  about 3-4 hours BEFORE bedtime.

## 2011-05-20 NOTE — Progress Notes (Signed)
  Subjective:    Patient ID: Erin Fox, female    DOB: [birth year], 51 y.o.   MRN: [id number]  HPI Patient comes in today for followup of insomnia.  She has not been seen since 2010, and was felt to have psychophysiologic insomnia primarily related to behavioral issues and poor sleep hygiene.  I gave her very specific behavioral modifications, but unfortunately she has not done any of these.  She continues to watch television in bed, is not getting up early enough to take advantage of the morning to light, and is still taking 1-2 hour naps during the day.  She is still taking Xanax in the evening to try to help her get to sleep, and has tried on a few occasions to abruptly discontinue the medicine.  I told her this cannot be done safely.  She is concerned that it is causing memory loss during the day, and would like to get off of this.  She is also concerned about the possibility of sleep apnea because of mild snoring, however denies any significant inappropriate daytime sleepiness when not lying down, and feels that she has had a good nights rest when she is able to sleep completely through the night.  I also feel that it would be near impossible to get an adequate study with her known insomnia.   Review of Systems  Constitutional: Positive for diaphoresis. Negative for fever and unexpected weight change.  HENT: Positive for congestion. Negative for ear pain, nosebleeds, sore throat, rhinorrhea, sneezing, trouble swallowing, dental problem, postnasal drip and sinus pressure.   Eyes: Negative for redness and itching.  Respiratory: Negative for cough, chest tightness, shortness of breath and wheezing.   Cardiovascular: Positive for leg swelling. Negative for palpitations.  Gastrointestinal: Negative for nausea and vomiting.  Genitourinary: Negative for dysuria.  Musculoskeletal: Negative for joint swelling.  Skin: Negative for rash.  Neurological: Negative for headaches.  Hematological: Does not  bruise/bleed easily.  Psychiatric/Behavioral: Negative for dysphoric mood. The patient is not nervous/anxious.        Objective:   Physical Exam Thin female in no acute distress Nose without purulence or discharge noted Chest totally clear Cardiac exam with regular rate and rhythm Lower extremities without edema, no cyanosis Alert and oriented, does not appear to be sleepy, moves all 4 extremities.       Assessment & Plan:

## 2011-05-22 ENCOUNTER — Other Ambulatory Visit: Payer: Self-pay | Admitting: Internal Medicine

## 2011-05-22 NOTE — Telephone Encounter (Signed)
Refill done.  

## 2011-06-03 NOTE — Telephone Encounter (Signed)
Done

## 2011-07-17 ENCOUNTER — Other Ambulatory Visit: Payer: Self-pay | Admitting: Internal Medicine

## 2011-07-18 NOTE — Telephone Encounter (Signed)
60, 3 RF 

## 2011-07-18 NOTE — Telephone Encounter (Signed)
Refill done.  

## 2011-07-18 NOTE — Telephone Encounter (Signed)
Ok to refill 

## 2011-08-02 ENCOUNTER — Other Ambulatory Visit: Payer: Self-pay | Admitting: Internal Medicine

## 2011-08-04 NOTE — Telephone Encounter (Signed)
Refill done.  

## 2011-09-23 ENCOUNTER — Other Ambulatory Visit: Payer: Self-pay | Admitting: Internal Medicine

## 2011-09-23 DIAGNOSIS — Z1231 Encounter for screening mammogram for malignant neoplasm of breast: Secondary | ICD-10-CM

## 2011-09-26 ENCOUNTER — Ambulatory Visit
Admission: RE | Admit: 2011-09-26 | Discharge: 2011-09-26 | Disposition: A | Discharge status: Home / Self Care | Payer: BC Managed Care – PPO | Source: Ambulatory Visit | Attending: Internal Medicine | Admitting: Internal Medicine

## 2011-09-26 DIAGNOSIS — Z1231 Encounter for screening mammogram for malignant neoplasm of breast: Secondary | ICD-10-CM

## 2011-10-09 ENCOUNTER — Other Ambulatory Visit: Payer: Self-pay | Admitting: Internal Medicine

## 2011-10-09 NOTE — Telephone Encounter (Signed)
Refill done.  

## 2011-11-11 ENCOUNTER — Other Ambulatory Visit: Payer: Self-pay | Admitting: Internal Medicine

## 2011-11-12 NOTE — Telephone Encounter (Signed)
Refill done.  

## 2011-11-19 ENCOUNTER — Other Ambulatory Visit: Payer: Self-pay | Admitting: Internal Medicine

## 2011-11-19 NOTE — Telephone Encounter (Signed)
lmovm for pt to return call.  

## 2011-11-19 NOTE — Telephone Encounter (Signed)
Last OV 05/20/11.  Last filled 07/17/11 #60 x3 Need Okay.        MW

## 2011-11-19 NOTE — Telephone Encounter (Signed)
Advise patient, It went ahead and Rx alprazolam 60 tablets, no RF She saw a sleep specialist, he recommended behavioral therapy as well as melatonin 3mg  about 3-4 hours BEFORE bedtime. I do recommend to take melatonin in addition to alprazolam. Also arrange a referral to psychology for a cognitive behavioral therapy.

## 2011-11-20 NOTE — Telephone Encounter (Signed)
Discussed with pt. She states that she has not been to behavioral therapy yet because of her insurance. She is getting a new insurance in November & she will contact us then to set up the referral.

## 2011-11-24 ENCOUNTER — Ambulatory Visit (INDEPENDENT_AMBULATORY_CARE_PROVIDER_SITE_OTHER): Payer: BC Managed Care – PPO | Admitting: Internal Medicine

## 2011-11-24 VITALS — BP 118/74 | HR 55 | Temp 98.2°F | Ht 67.5 in | Wt 157.0 lb

## 2011-11-24 DIAGNOSIS — F519 Sleep disorder not due to a substance or known physiological condition, unspecified: Secondary | ICD-10-CM

## 2011-11-24 DIAGNOSIS — Z0000 Encounter for general adult medical examination without abnormal findings: Secondary | ICD-10-CM

## 2011-11-24 LAB — CBC WITH DIFFERENTIAL/PLATELET
Basophils Relative: 0.8 % (ref 0.0–3.0)
Eosinophils Relative: 2.3 % (ref 0.0–5.0)
HCT: 38.2 % (ref 36.0–46.0)
MCV: 96.4 fl (ref 78.0–100.0)
Monocytes Relative: 9.6 % (ref 3.0–12.0)
Neutrophils Relative %: 45.7 % (ref 43.0–77.0)
Platelets: 192 10*3/uL (ref 150.0–400.0)
RBC: 3.96 Mil/uL (ref 3.87–5.11)
WBC: 4 10*3/uL — ABNORMAL LOW (ref 4.5–10.5)

## 2011-11-24 LAB — COMPREHENSIVE METABOLIC PANEL
Albumin: 4 g/dL (ref 3.5–5.2)
Alkaline Phosphatase: 52 U/L (ref 39–117)
BUN: 17 mg/dL (ref 6–23)
CO2: 32 mEq/L (ref 19–32)
GFR: 126.45 mL/min (ref >60.00)
Glucose, Bld: 82 mg/dL (ref 70–99)
Sodium: 141 mEq/L (ref 135–145)
Total Bilirubin: 0.5 mg/dL (ref 0.3–1.2)
Total Protein: 7.3 g/dL (ref 6.0–8.3)

## 2011-11-24 LAB — LIPID PANEL
Cholesterol: 189 mg/dL (ref 0–200)
HDL: 87 mg/dL (ref >39.00)
LDL Cholesterol: 87 mg/dL (ref 0–99)
Triglycerides: 75 mg/dL (ref 0.0–149.0)
VLDL: 15 mg/dL (ref 0.0–40.0)

## 2011-11-24 LAB — TSH: TSH: 2.17 u[IU]/mL (ref 0.35–5.50)

## 2011-11-24 MED ORDER — ALPRAZOLAM 0.5 MG PO TABS: 1.0000 mg | ORAL_TABLET | Freq: Every evening | ORAL | Status: DC | PRN

## 2011-11-24 MED ORDER — ACYCLOVIR 400 MG PO TABS: 400.0000 mg | ORAL_TABLET | Freq: Two times a day (BID) | ORAL | Status: DC | PRN

## 2011-11-24 MED ORDER — CITALOPRAM HYDROBROMIDE 20 MG PO TABS: ORAL_TABLET | ORAL | Status: DC

## 2011-11-24 MED ORDER — FLUTICASONE PROPIONATE 50 MCG/ACT NA SUSP: 2.0000 | Freq: Every day | NASAL | Status: DC

## 2011-11-24 NOTE — Assessment & Plan Note (Signed)
Td 06 PAP done 2012, never had an abnormal one; next in 1-2 years Had a  Mammogram 09-2011: negative Breast exam today Colonoscopy November 2010: Polyps, diverticula, hemorrhoids. Next colonoscopy ----November 2015 discussed quitting tobacco, diet and exercise Labs

## 2011-11-24 NOTE — Progress Notes (Signed)
  Subjective:    Patient ID: Erin Fox, female    DOB: 1960-11-16, 51 y.o.   MRN: [id number]  HPI CPX  Past Medical History: Hyperlipidemia G2 P1 Ab1 OAB  h/o hematuria, cysto 08-2009 insomnia HSV  Past Surgical History: 1990 - "tummy tuck" 5th toes resected bilat  Hysterectomy (1984)- has 1 ovary  Caesarean section breast bx (RT) 2001 lumpectomy (RT) 2005  bladder cystoscopy  Social History: Single, living w/ her sister Has 1 daughter  Research officer, trade union for a [employer] Tobacco-- 1/2 ppd ETOH-- rarely Diet-- needs improvement exercise-- active at work  Review of Systems In general doing well Denies chest pain or shortness of breath No nausea, vomiting, diarrhea or blood in the stools. No dysuria gross hematuria, likes a urinalysis checked. Sx  well controlled with citalopram. Sleeps "okay" with alprazolam.     Objective:   Physical Exam  General -- alert, well-developed, and well-nourished.   Neck --no thyromegaly , normal carotid pulse Breasts-- No mass, nodules, thickening, tenderness, bulging, retraction, inflamation, nipple discharge or skin changes noted.  no axillary lymph nodes Lungs -- normal respiratory effort, no intercostal retractions, no accessory muscle use, and normal breath sounds.   Heart-- normal rate, regular rhythm, no murmur, and no gallop.   Abdomen--soft, non-tender, no distention, no masses, no HSM, no guarding, and no rigidity.   Extremities--   just proximal from the ankle, the left leg slightly larger than right   Neurologic-- alert & oriented X3 and strength normal in all extremities. Psych-- Cognition and judgment appear intact. Alert and cooperative with normal attention span and concentration.  not anxious appearing and not depressed appearing.       Assessment & Plan:

## 2011-11-24 NOTE — Assessment & Plan Note (Signed)
Saw pulmonary, was recommended to get psychotherapy, plans to do that once she has appropriate insurance. For now she is sleeping "okay" with alprazolam, refill provided.

## 2011-11-25 ENCOUNTER — Encounter: Payer: Self-pay | Admitting: Internal Medicine

## 2011-11-26 ENCOUNTER — Encounter: Payer: Self-pay | Admitting: *Deleted

## 2011-11-26 ENCOUNTER — Encounter: Payer: BC Managed Care – PPO | Admitting: Internal Medicine

## 2011-12-14 ENCOUNTER — Encounter: Payer: Self-pay | Admitting: Internal Medicine

## 2012-01-22 ENCOUNTER — Ambulatory Visit (INDEPENDENT_AMBULATORY_CARE_PROVIDER_SITE_OTHER): Payer: BC Managed Care – PPO

## 2012-01-22 DIAGNOSIS — Z23 Encounter for immunization: Secondary | ICD-10-CM

## 2012-02-10 ENCOUNTER — Other Ambulatory Visit: Payer: Self-pay | Admitting: Internal Medicine

## 2012-02-12 NOTE — Telephone Encounter (Signed)
Refilled med

## 2012-02-12 NOTE — Telephone Encounter (Signed)
Not on pt's current med list. OK to refill? 

## 2012-02-12 NOTE — Telephone Encounter (Signed)
lmovm for pt to return call.  

## 2012-02-12 NOTE — Telephone Encounter (Signed)
Pt return call and states that she is taking med and was on med at last OV. Pt is unsure why it is not on med list.

## 2012-02-12 NOTE — Telephone Encounter (Signed)
Call patient, when I saw her 11/23/2011 for physical, apparently  she was not taking that medication. If she has not been taking that medication then no need for refill

## 2012-03-08 ENCOUNTER — Telehealth: Payer: Self-pay | Admitting: Internal Medicine

## 2012-03-08 NOTE — Telephone Encounter (Signed)
Left detailed msg on pt's vmail.  

## 2012-03-08 NOTE — Telephone Encounter (Signed)
I recommend a visit, urgently if she has lower abdominal pain, fever or chills.

## 2012-03-08 NOTE — Telephone Encounter (Signed)
Please advise 

## 2012-03-08 NOTE — Telephone Encounter (Signed)
Patient states she has a bacterial infection and Dr. Drue Novel has prescribed her metroNIDAZOLE (FLAGYL) 500 MG tablet before. She would like to know if he can do this again without her being seen. Pt uses [facility] on [address].

## 2012-03-09 ENCOUNTER — Encounter: Payer: Self-pay | Admitting: Lab

## 2012-03-09 NOTE — Telephone Encounter (Signed)
Pt scheduled appt 1.30.14.

## 2012-03-11 ENCOUNTER — Encounter: Payer: Self-pay | Admitting: Internal Medicine

## 2012-03-11 ENCOUNTER — Ambulatory Visit (INDEPENDENT_AMBULATORY_CARE_PROVIDER_SITE_OTHER): Payer: BC Managed Care – PPO | Admitting: Internal Medicine

## 2012-03-11 VITALS — BP 114/72 | HR 59 | Temp 98.3°F | Wt 167.0 lb

## 2012-03-11 DIAGNOSIS — N760 Acute vaginitis: Secondary | ICD-10-CM

## 2012-03-11 DIAGNOSIS — R319 Hematuria, unspecified: Secondary | ICD-10-CM

## 2012-03-11 LAB — POCT URINALYSIS DIPSTICK
Ketones, UA: NEGATIVE
Protein, UA: NEGATIVE
Spec Grav, UA: 1.015
Urobilinogen, UA: 0.2
pH, UA: 7

## 2012-03-11 MED ORDER — METRONIDAZOLE 0.75 % VA GEL: 1.0000 | Freq: Two times a day (BID) | VAGINAL | Status: DC

## 2012-03-11 NOTE — Patient Instructions (Addendum)
Use the prescribed gel twice a day for 5 days. Call if symptoms persist

## 2012-03-11 NOTE — Progress Notes (Signed)
  Subjective:    Patient ID: Erin Fox, female    DOB: 05-24-1960, 52 y.o.   MRN: [id number]  HPI Acute visit  One-week history of a very small amount of light yellow vaginal discharge and odor. Few days ago she took a OTC cream for "bacterial infections", slightly better since but still has some odor. Denies any vaginal rash or itching.  Past Medical History  Diagnosis Date  . Hyperlipidemia   . H/O: hematuria     cysto 08-2009 @ urology  . Insomnia   . OAB (overactive bladder)   . HSV (herpes simplex virus) infection   . Diverticulosis   . Hemorrhoids    Past Surgical History  Procedure Date  . Tummy tuck 1990  . 5th toes resected bilateral   . Abdominal hysterectomy 1984    has one ovary  . Cesarean section   . Breast biopsy 2001    right  . Breast lumpectomy 2005    right  . Cystoscopy w/ dilation of bladder     History   Social History  . Marital Status: Divorced    Spouse Name: N/A    Number of Children: 1  . Years of Education: N/A   Occupational History  . quality technician    Social History Main Topics  . Smoking status: Current Every Day Smoker -- 0.3 packs/day for 33 years    Types: Cigarettes  . Smokeless tobacco: Never Used  . Alcohol Use: Yes     Comment: rarely   . Drug Use: No  . Sexually Active: Not on file    Review of Systems No fever or chills. No lower abdominal pain. No dysuria gross hematuria. No vomiting diarrhea. Has not been sexually active in a while.    Objective:   Physical Exam General -- alert, well-developed, and well-nourished.    Abdomen--soft, non-tender, no distention, no masses, no HSM, no guarding, and no rigidity.   Extremities-- no pretibial edema bilaterally GU-- External examination normal. Vagina without lesions, small amount of white creamy discharge noted. Bimanual exam without mass or tenderness. Psych-- Cognition and judgment appear intact. Alert and cooperative with normal attention span and  concentration.  not anxious appearing and not depressed appearing.      Assessment & Plan:  Vaginitis, Partially treated? Plan: Sent a wet prep, empiric Flagyl We did check a urine, + blood, no uti sx, will culture it DX hematuria

## 2012-03-12 LAB — WET PREP BY MOLECULAR PROBE
Gardnerella vaginalis: POSITIVE — AB
Trichomonas vaginosis: NEGATIVE

## 2012-03-13 LAB — URINE CULTURE: Colony Count: NO GROWTH

## 2012-05-03 ENCOUNTER — Other Ambulatory Visit: Payer: Self-pay | Admitting: Internal Medicine

## 2012-05-03 NOTE — Telephone Encounter (Signed)
Refill done.  

## 2012-06-14 ENCOUNTER — Telehealth: Payer: Self-pay | Admitting: Internal Medicine

## 2012-06-15 ENCOUNTER — Encounter: Payer: Self-pay | Admitting: *Deleted

## 2012-06-15 NOTE — Telephone Encounter (Signed)
Ok to refill? Last OV 1.30.14 Last filled 10.14.13

## 2012-06-15 NOTE — Telephone Encounter (Signed)
Pt made aware rx & controlled substance contract is ready to be picked up at front desk.  

## 2012-06-15 NOTE — Telephone Encounter (Signed)
Done, needs  A controlled substance agreement and a drug screen.

## 2012-06-28 ENCOUNTER — Other Ambulatory Visit: Payer: Self-pay | Admitting: Internal Medicine

## 2012-06-29 NOTE — Telephone Encounter (Signed)
Refill done.  

## 2012-07-13 ENCOUNTER — Encounter: Payer: Self-pay | Admitting: Internal Medicine

## 2012-09-23 ENCOUNTER — Other Ambulatory Visit: Payer: Self-pay | Admitting: Internal Medicine

## 2012-09-23 ENCOUNTER — Other Ambulatory Visit: Payer: Self-pay

## 2012-09-23 DIAGNOSIS — Z1231 Encounter for screening mammogram for malignant neoplasm of breast: Secondary | ICD-10-CM

## 2012-09-24 NOTE — Telephone Encounter (Signed)
Refill done per protocol.  

## 2012-10-13 ENCOUNTER — Telehealth: Payer: Self-pay | Admitting: Internal Medicine

## 2012-10-13 ENCOUNTER — Ambulatory Visit: Payer: BC Managed Care – PPO

## 2012-10-14 NOTE — Telephone Encounter (Signed)
Last filled: 06/14/12  Last visit: 02/10/12  UDS: 06/15/12-Low Risk, contract on file  Please advise. SW, CMA

## 2012-10-15 NOTE — Telephone Encounter (Signed)
Refill provided.  She is due for a physical exam in October, please arrange

## 2012-10-20 ENCOUNTER — Ambulatory Visit
Admission: RE | Admit: 2012-10-20 | Discharge: 2012-10-20 | Disposition: A | Discharge status: Home / Self Care | Payer: BC Managed Care – PPO | Source: Ambulatory Visit

## 2012-10-20 DIAGNOSIS — Z1231 Encounter for screening mammogram for malignant neoplasm of breast: Secondary | ICD-10-CM

## 2012-10-21 ENCOUNTER — Ambulatory Visit: Payer: BC Managed Care – PPO

## 2012-11-29 ENCOUNTER — Other Ambulatory Visit: Payer: Self-pay | Admitting: Internal Medicine

## 2012-11-29 NOTE — Telephone Encounter (Signed)
Zovirax and Simvastatin refills sent to pharmacy. OV due

## 2012-12-09 ENCOUNTER — Other Ambulatory Visit: Payer: Self-pay | Admitting: Internal Medicine

## 2012-12-09 NOTE — Telephone Encounter (Signed)
rx refilled per protocol. DJR  

## 2012-12-16 ENCOUNTER — Other Ambulatory Visit: Payer: Self-pay

## 2012-12-25 LAB — HM MAMMOGRAPHY: HM Mammogram: NORMAL

## 2013-01-04 ENCOUNTER — Other Ambulatory Visit: Payer: Self-pay | Admitting: Internal Medicine

## 2013-01-05 ENCOUNTER — Other Ambulatory Visit: Payer: Self-pay | Admitting: Internal Medicine

## 2013-01-05 NOTE — Telephone Encounter (Signed)
Citalopram and simvastatin refilled per protocol

## 2013-01-12 ENCOUNTER — Telehealth: Payer: Self-pay | Admitting: *Deleted

## 2013-01-12 MED ORDER — ALPRAZOLAM 0.5 MG PO TABS: ORAL_TABLET | ORAL | Status: DC

## 2013-01-12 NOTE — Telephone Encounter (Signed)
Patient called and requested refill on Xanax  Last filled-10/13/2012  Last seen-03/11/2012  UDS-06/15/2012 low risk, contract signed  Please advise. SW

## 2013-01-12 NOTE — Telephone Encounter (Signed)
Call patient: RF done, needs office visit before next refill

## 2013-01-24 ENCOUNTER — Other Ambulatory Visit: Payer: Self-pay | Admitting: Internal Medicine

## 2013-01-24 NOTE — Telephone Encounter (Signed)
rx refilled per protocol. DJR  

## 2013-01-25 ENCOUNTER — Encounter: Payer: BC Managed Care – PPO | Admitting: Internal Medicine

## 2013-02-14 ENCOUNTER — Other Ambulatory Visit: Payer: Self-pay | Admitting: *Deleted

## 2013-02-14 MED ORDER — ALPRAZOLAM 0.5 MG PO TABS: ORAL_TABLET | ORAL | Status: DC

## 2013-02-22 ENCOUNTER — Telehealth: Payer: Self-pay

## 2013-02-22 NOTE — Telephone Encounter (Addendum)
Left message for call back. Non-identifiable  Medication and allergies:  Reviewed and updated  90 day supply/mail order: n/a Local pharmacy:  [facility]    Immunizations due: UTD   A/P: No changes to personal, family history or past surgical hx Pap-11/05/10-normal CCS-01/08/09-Benign polyp-Dr. Kaplan-recommend every 5 years-Due 12/2013 MMG-10/20/12-negative Flu- 12/10/2012 per pt.  Tdap-07/11/2004   To Discuss with Provider: Not at this time.

## 2013-02-24 ENCOUNTER — Encounter: Payer: Self-pay | Admitting: Internal Medicine

## 2013-02-24 ENCOUNTER — Ambulatory Visit (INDEPENDENT_AMBULATORY_CARE_PROVIDER_SITE_OTHER): Payer: BC Managed Care – PPO | Admitting: Internal Medicine

## 2013-02-24 VITALS — BP 114/72 | HR 62 | Temp 98.0°F | Ht 68.0 in | Wt 171.0 lb

## 2013-02-24 DIAGNOSIS — F519 Sleep disorder not due to a substance or known physiological condition, unspecified: Secondary | ICD-10-CM

## 2013-02-24 DIAGNOSIS — Z0000 Encounter for general adult medical examination without abnormal findings: Secondary | ICD-10-CM

## 2013-02-24 DIAGNOSIS — I872 Venous insufficiency (chronic) (peripheral): Secondary | ICD-10-CM

## 2013-02-24 DIAGNOSIS — E785 Hyperlipidemia, unspecified: Secondary | ICD-10-CM

## 2013-02-24 LAB — CBC WITH DIFFERENTIAL/PLATELET
Basophils Absolute: 0 K/uL (ref 0.0–0.1)
Basophils Relative: 0.6 % (ref 0.0–3.0)
Eosinophils Absolute: 0.1 K/uL (ref 0.0–0.7)
Eosinophils Relative: 3 % (ref 0.0–5.0)
HCT: 36.2 % (ref 36.0–46.0)
Hemoglobin: 12.2 g/dL (ref 12.0–15.0)
Lymphocytes Relative: 33.4 % (ref 12.0–46.0)
Lymphs Abs: 1.3 K/uL (ref 0.7–4.0)
MCHC: 33.8 g/dL (ref 30.0–36.0)
MCV: 93.3 fl (ref 78.0–100.0)
Monocytes Absolute: 0.5 K/uL (ref 0.1–1.0)
Monocytes Relative: 13.6 % — ABNORMAL HIGH (ref 3.0–12.0)
Neutro Abs: 1.9 K/uL (ref 1.4–7.7)
Neutrophils Relative %: 49.4 % (ref 43.0–77.0)
Platelets: 207 K/uL (ref 150.0–400.0)
RBC: 3.88 Mil/uL (ref 3.87–5.11)
RDW: 14.5 % (ref 11.5–14.6)
WBC: 3.8 K/uL — ABNORMAL LOW (ref 4.5–10.5)

## 2013-02-24 LAB — COMPREHENSIVE METABOLIC PANEL WITH GFR
ALT: 11 U/L (ref 0–35)
AST: 14 U/L (ref 0–37)
Albumin: 3.7 g/dL (ref 3.5–5.2)
Alkaline Phosphatase: 60 U/L (ref 39–117)
BUN: 21 mg/dL (ref 6–23)
CO2: 29 meq/L (ref 19–32)
Calcium: 9.2 mg/dL (ref 8.4–10.5)
Chloride: 111 meq/L (ref 96–112)
Creatinine, Ser: 0.8 mg/dL (ref 0.4–1.2)
GFR: 100.13 mL/min (ref >60.00)
Glucose, Bld: 93 mg/dL (ref 70–99)
Potassium: 3.9 meq/L (ref 3.5–5.1)
Sodium: 144 meq/L (ref 135–145)
Total Bilirubin: 0.4 mg/dL (ref 0.3–1.2)
Total Protein: 6.6 g/dL (ref 6.0–8.3)

## 2013-02-24 LAB — LIPID PANEL
Cholesterol: 195 mg/dL (ref 0–200)
HDL: 80.7 mg/dL (ref >39.00)
LDL Cholesterol: 106 mg/dL — ABNORMAL HIGH (ref 0–99)
Total CHOL/HDL Ratio: 2
Triglycerides: 40 mg/dL (ref 0.0–149.0)
VLDL: 8 mg/dL (ref 0.0–40.0)

## 2013-02-24 LAB — TSH: TSH: 1.08 u[IU]/mL (ref 0.35–5.50)

## 2013-02-24 NOTE — Assessment & Plan Note (Addendum)
Td 06 Had a flu shot zostavax discussed  PAP done 2012, never had an abnormal one; h/o hysterectomy for benign reasons, not sexually active. Per guidelines no need for further paps but will consider a pelvic exam in 2017 Had a  Mammogram 10-2012: negative Breast exam today (-) Colonoscopy November 2010: Polyps, diverticula, hemorrhoids. Dr Deatra Ina. Next colonoscopy ----November 2015, pt aware  discussed quitting tobacco, diet and exercise Labs Menopausal for many years, encouraged calcium, vitamin D; will check a bone density test

## 2013-02-24 NOTE — Assessment & Plan Note (Signed)
Good compliance with meds, labs

## 2013-02-24 NOTE — Assessment & Plan Note (Signed)
Recently run out of Xanax  And  had a difficult time sleeping, refill Xanax when necessary UDS (-) 06-2012, recheck on RTC

## 2013-02-24 NOTE — Progress Notes (Signed)
   Subjective:    Patient ID: Erin Fox, female    DOB: [birth year], 53 y.o.   MRN: [id number]  HPI CPX   Past Medical History  Diagnosis Date  . Hyperlipidemia   . H/O: hematuria     cysto 08-2009 @ urology  . Insomnia   . OAB (overactive bladder)   . HSV (herpes simplex virus) infection   . Diverticulosis    Past Surgical History  Procedure Laterality Date  . Tummy tuck  1990  . 5th toes resected bilateral    . Abdominal hysterectomy  1984    has one ovary  . Cesarean section    . Breast biopsy  2001    right  . Breast lumpectomy  2005    right  . Cystoscopy w/ dilation of bladder        Social History: Single, living w/ her sister Has 1 daughter   Has a new job @ [employer]--  > 1/2 ppd ETOH-- rarely   Family History  Problem Relation Age of Onset  . Hyperlipidemia Mother   . Diabetes Father   . Hypertension Mother     M and sister   . Cancer Paternal Aunt     lung  . Colon cancer      uncle , dx at age 28s  . Breast cancer Neg Hx      Review of Systems Diet-- not very healthy Exercise-- active at work, no routine exercise  No  CP, SOB Denies  nausea, vomiting diarrhea Denies  blood in the stools No dysuria, gross hematuria, difficulty urinating   No vag d/c or bleed   Rectal bleed once  5 months ago, no further sx, associated to dry-hard  BMs; h/o such sx on-off        Objective:   Physical Exam BP 114/72  Pulse 62  Temp(Src) 98 F (36.7 C)  Ht 5\' 8"  (1.727 m)  Wt 171 lb (77.565 kg)  BMI 26.01 kg/m2  SpO2 97% General -- alert, well-developed, NAD.  Neck --no thyromegaly  HEENT-- Not pale.   Breast-- no dominant mass, skin and nipples normal to inspection on palpation, axillary areas without mass or lymphadenopathy Lungs -- normal respiratory effort, no intercostal retractions, no accessory muscle use, and normal breath sounds.  Heart-- normal rate, regular rhythm, no murmur.  Abdomen-- Not distended, good bowel  sounds,soft, non-tender. Extremities-- no pitting  edema bilaterally but L leg is bigger than R (at baseline) Neurologic--  alert & oriented X3. Speech normal, gait normal, strength normal in all extremities.  Psych-- Cognition and judgment appear intact. Cooperative with normal attention span and concentration. No anxious or depressed appearing.      Assessment & Plan:  Had respiratory symptoms back in Christmas, went to a urgent care, status post a Z-Pak and a codeine syrup, doing better.

## 2013-02-24 NOTE — Progress Notes (Signed)
Pre visit review using our clinic review tool, if applicable. No additional management support is needed unless otherwise documented below in the visit note. 

## 2013-02-24 NOTE — Patient Instructions (Signed)
Get your blood work before you leave   Next visit is for routine check up regards xanax Rx  in 6 months , no  fasting Please make an appointment   zostavax? Calcium 1 g and vitamin D 1000 units daily      Smoking Cessation Quitting smoking is important to your health and has many advantages. However, it is not always easy to quit since nicotine is a very addictive drug. Often times, people try 3 times or more before being able to quit. This document explains the best ways for you to prepare to quit smoking. Quitting takes hard work and a lot of effort, but you can do it. ADVANTAGES OF QUITTING SMOKING  You will live longer, feel better, and live better.  Your body will feel the impact of quitting smoking almost immediately.  Within 20 minutes, blood pressure decreases. Your pulse returns to its normal level.  After 8 hours, carbon monoxide levels in the blood return to normal. Your oxygen level increases.  After 24 hours, the chance of having a heart attack starts to decrease. Your breath, hair, and body stop smelling like smoke.  After 48 hours, damaged nerve endings begin to recover. Your sense of taste and smell improve.  After 72 hours, the body is virtually free of nicotine. Your bronchial tubes relax and breathing becomes easier.  After 2 to 12 weeks, lungs can hold more air. Exercise becomes easier and circulation improves.  The risk of having a heart attack, stroke, cancer, or lung disease is greatly reduced.  After 1 year, the risk of coronary heart disease is cut in half.  After 5 years, the risk of stroke falls to the same as a nonsmoker.  After 10 years, the risk of lung cancer is cut in half and the risk of other cancers decreases significantly.  After 15 years, the risk of coronary heart disease drops, usually to the level of a nonsmoker.  If you are pregnant, quitting smoking will improve your chances of having a healthy baby.  The people you live with,  especially any children, will be healthier.  You will have extra money to spend on things other than cigarettes. QUESTIONS TO THINK ABOUT BEFORE ATTEMPTING TO QUIT You may want to talk about your answers with your caregiver.  Why do you want to quit?  If you tried to quit in the past, what helped and what did not?  What will be the most difficult situations for you after you quit? How will you plan to handle them?  Who can help you through the tough times? Your family? Friends? A caregiver?  What pleasures do you get from smoking? What ways can you still get pleasure if you quit? Here are some questions to ask your caregiver:  How can you help me to be successful at quitting?  What medicine do you think would be best for me and how should I take it?  What should I do if I need more help?  What is smoking withdrawal like? How can I get information on withdrawal? GET READY  Set a quit date.  Change your environment by getting rid of all cigarettes, ashtrays, matches, and lighters in your home, car, or work. Do not let people smoke in your home.  Review your past attempts to quit. Think about what worked and what did not. GET SUPPORT AND ENCOURAGEMENT You have a better chance of being successful if you have help. You can get support in many ways.  Tell your family, friends, and co-workers that you are going to quit and need their support. Ask them not to smoke around you.  Get individual, group, or telephone counseling and support. Programs are available at [employer] and health centers. Call your local health department for information about programs in your area.  Spiritual beliefs and practices may help some smokers quit.  Download a "quit meter" on your computer to keep track of quit statistics, such as how long you have gone without smoking, cigarettes not smoked, and money saved.  Get a self-help book about quitting smoking and staying off of tobacco. Yolo yourself from urges to smoke. Talk to someone, go for a walk, or occupy your time with a task.  Change your normal routine. Take a different route to work. Drink tea instead of coffee. Eat breakfast in a different place.  Reduce your stress. Take a hot bath, exercise, or read a book.  Plan something enjoyable to do every day. Reward yourself for not smoking.  Explore interactive web-based programs that specialize in helping you quit. GET MEDICINE AND USE IT CORRECTLY Medicines can help you stop smoking and decrease the urge to smoke. Combining medicine with the above behavioral methods and support can greatly increase your chances of successfully quitting smoking.  Nicotine replacement therapy helps deliver nicotine to your body without the negative effects and risks of smoking. Nicotine replacement therapy includes nicotine gum, lozenges, inhalers, nasal sprays, and skin patches. Some may be available over-the-counter and others require a prescription.  Antidepressant medicine helps people abstain from smoking, but how this works is unknown. This medicine is available by prescription.  Nicotinic receptor partial agonist medicine simulates the effect of nicotine in your brain. This medicine is available by prescription. Ask your caregiver for advice about which medicines to use and how to use them based on your health history. Your caregiver will tell you what side effects to look out for if you choose to be on a medicine or therapy. Carefully read the information on the package. Do not use any other product containing nicotine while using a nicotine replacement product.  RELAPSE OR DIFFICULT SITUATIONS Most relapses occur within the first 3 months after quitting. Do not be discouraged if you start smoking again. Remember, most people try several times before finally quitting. You may have symptoms of withdrawal because your body is used to nicotine. You may crave  cigarettes, be irritable, feel very hungry, cough often, get headaches, or have difficulty concentrating. The withdrawal symptoms are only temporary. They are strongest when you first quit, but they will go away within 10 14 days. To reduce the chances of relapse, try to:  Avoid drinking alcohol. Drinking lowers your chances of successfully quitting.  Reduce the amount of caffeine you consume. Once you quit smoking, the amount of caffeine in your body increases and can give you symptoms, such as a rapid heartbeat, sweating, and anxiety.  Avoid smokers because they can make you want to smoke.  Do not let weight gain distract you. Many smokers will gain weight when they quit, usually less than 10 pounds. Eat a healthy diet and stay active. You can always lose the weight gained after you quit.  Find ways to improve your mood other than smoking. FOR MORE INFORMATION  www.smokefree.gov  Document Released: 01/21/2001 Document Revised: 07/29/2011 Document Reviewed: 05/08/2011 [facility] Patient Information 2014 [place], [place].

## 2013-02-24 NOTE — Assessment & Plan Note (Signed)
Left lower extremity larger but at baseline he

## 2013-02-25 ENCOUNTER — Encounter: Payer: Self-pay | Admitting: Internal Medicine

## 2013-02-25 ENCOUNTER — Telehealth: Payer: Self-pay | Admitting: Internal Medicine

## 2013-02-25 LAB — VITAMIN D 25 HYDROXY (VIT D DEFICIENCY, FRACTURES): VIT D 25 HYDROXY: 13 ng/mL — AB (ref 30–89)

## 2013-02-25 NOTE — Telephone Encounter (Signed)
Relevant patient education assigned to patient using Emmi. ° °

## 2013-02-26 ENCOUNTER — Other Ambulatory Visit: Payer: Self-pay | Admitting: Internal Medicine

## 2013-02-26 MED ORDER — ERGOCALCIFEROL 1.25 MG (50000 UT) PO CAPS: 50000.0000 [IU] | ORAL_CAPSULE | ORAL | Status: DC

## 2013-03-03 ENCOUNTER — Other Ambulatory Visit: Payer: Self-pay | Admitting: Internal Medicine

## 2013-03-03 ENCOUNTER — Telehealth: Payer: Self-pay

## 2013-03-03 NOTE — Telephone Encounter (Signed)
UDS: 02/28/2013 Neg for xanax Moderate risk per Dr Larose Kells

## 2013-03-07 ENCOUNTER — Telehealth: Payer: Self-pay | Admitting: Internal Medicine

## 2013-03-07 MED ORDER — SIMVASTATIN 20 MG PO TABS: ORAL_TABLET | ORAL | Status: DC

## 2013-03-07 MED ORDER — CITALOPRAM HYDROBROMIDE 20 MG PO TABS: ORAL_TABLET | ORAL | Status: DC

## 2013-03-07 MED ORDER — VALACYCLOVIR HCL 500 MG PO TABS
500.0000 mg | ORAL_TABLET | Freq: Two times a day (BID) | ORAL | Status: DC
Start: 2013-03-07 — End: 2013-04-15

## 2013-03-07 NOTE — Addendum Note (Signed)
Addended by: Peggyann Shoals on: 03/07/2013 05:06 PM   Modules accepted: Orders

## 2013-03-07 NOTE — Telephone Encounter (Signed)
rx refill - alprazolam 0.5 mg Last OV- 02/24/13  Last refill- 02/14/13 #60 / 0 rf  Last UDS- 06/15/12 LOW   Valtrex  & Citalopram 20 mg  Last refilled - 01/24/13  45 / 0 rf

## 2013-03-07 NOTE — Telephone Encounter (Addendum)
Okay Valtrex #60 and one refill Okay  citalopram for 6 months if due for refill Too early for xanax

## 2013-03-07 NOTE — Telephone Encounter (Signed)
Patient called stating the needs new prescriptions for the following meds: Citalopram, alprazolam, simvastatin, and valtrex [facility] on [address]

## 2013-03-08 ENCOUNTER — Telehealth: Payer: Self-pay | Admitting: *Deleted

## 2013-03-08 DIAGNOSIS — E785 Hyperlipidemia, unspecified: Secondary | ICD-10-CM | POA: Insufficient documentation

## 2013-03-08 MED ORDER — ALPRAZOLAM 0.5 MG PO TABS: ORAL_TABLET | ORAL | Status: DC

## 2013-03-08 NOTE — Telephone Encounter (Signed)
Patient called and requested a refill for ALPRAZolam (XANAX) 0.5 MG tablet 6 month refill   Pharmacy [facility] [address]

## 2013-03-08 NOTE — Telephone Encounter (Signed)
rx refill- xanax 0.5mg  Last OV- 02/24/13 Last refilled- 02/14/13 #60 / 0 rf  Last UDS- 06/15/12 LOW

## 2013-03-08 NOTE — Telephone Encounter (Signed)
UDS 02/28/2013-------------------> moderate risk Okay to refill today

## 2013-03-08 NOTE — Addendum Note (Signed)
Addended by: Kathlene November E on: 03/08/2013 01:36 PM   Modules accepted: Orders

## 2013-04-01 ENCOUNTER — Telehealth: Payer: Self-pay | Admitting: *Deleted

## 2013-04-01 ENCOUNTER — Other Ambulatory Visit: Payer: Self-pay | Admitting: Internal Medicine

## 2013-04-01 MED ORDER — ALPRAZOLAM 0.5 MG PO TABS: ORAL_TABLET | ORAL | Status: DC

## 2013-04-01 NOTE — Telephone Encounter (Signed)
rx refill- Xanax 0.5mg  Last OV- 02/24/13 Last refilled- 03/08/13 #60 / 0 rf  Last UDS 02/28/2013 moderate risk

## 2013-04-01 NOTE — Telephone Encounter (Signed)
Prescription printed. Next UDS should be by 05/29/2013

## 2013-04-01 NOTE — Telephone Encounter (Signed)
rx faxed to [id number] - [place] [address] [place] [address]

## 2013-04-01 NOTE — Addendum Note (Signed)
Addended by: Kathlene November E on: 04/01/2013 04:30 PM   Modules accepted: Orders

## 2013-04-11 ENCOUNTER — Other Ambulatory Visit: Payer: Self-pay | Admitting: Internal Medicine

## 2013-04-15 ENCOUNTER — Telehealth: Payer: Self-pay | Admitting: *Deleted

## 2013-04-15 MED ORDER — FLUTICASONE PROPIONATE 50 MCG/ACT NA SUSP: NASAL | Status: DC

## 2013-04-15 MED ORDER — VALACYCLOVIR HCL 500 MG PO TABS: 500.0000 mg | ORAL_TABLET | Freq: Two times a day (BID) | ORAL | Status: DC

## 2013-04-15 NOTE — Telephone Encounter (Signed)
rx refill for valtrex 500 mg  Last refilled - 1 /26/ 15 #60 / 1 rf  Last OV- 02/24/13   rx refill flonase 73mcg  Last refilled - 05/03/12 16g / 3 rf

## 2013-04-15 NOTE — Telephone Encounter (Signed)
04/15/2013  Pt came by office this morning requesting refills of the following:  fluticasone (FLONASE) 50 MCG/ACT nasal spray  Last refill 05/03/2012, 3 refills  valACYclovir (VALTREX) 500 MG tablet  Last refill  03/07/13, #60, 1 refill  Last OV 02/24/2013 for annual exam  Please change pharmacy for all medications to [facility] on [address].  Thank you

## 2013-04-15 NOTE — Telephone Encounter (Signed)
rx sent

## 2013-04-15 NOTE — Telephone Encounter (Addendum)
Ok valtrex-flonase  x 8 months

## 2013-04-15 NOTE — Addendum Note (Signed)
Addended by: Peggyann Shoals on: 04/15/2013 01:22 PM   Modules accepted: Orders

## 2013-05-04 ENCOUNTER — Encounter: Payer: Self-pay | Admitting: Internal Medicine

## 2013-07-09 ENCOUNTER — Telehealth: Payer: Self-pay | Admitting: Internal Medicine

## 2013-07-11 ENCOUNTER — Telehealth: Payer: Self-pay | Admitting: *Deleted

## 2013-07-11 MED ORDER — ALPRAZOLAM 0.5 MG PO TABS: ORAL_TABLET | ORAL | Status: DC

## 2013-07-11 NOTE — Telephone Encounter (Signed)
Sent to DR. PAZ

## 2013-07-11 NOTE — Telephone Encounter (Signed)
Needs a UDS at the time of Rx pickup.

## 2013-07-11 NOTE — Telephone Encounter (Signed)
Xanax 0.5mg   Last OV- 02/24/13 Last refilled- 04/01/13 # 60 / 2 rf  UDS- Moderate risk. 02/28/13

## 2013-07-12 NOTE — Telephone Encounter (Signed)
Pt notified of pick up.  

## 2013-07-12 NOTE — Telephone Encounter (Signed)
Contract given to the patient to signed and she is going to complete the UDS today. She wanted to know why she only had one refill. I spoke with Dr.Paz and the he gave her enough to last until her next Office visit. She has 1 refill and then she is due, he is happy to write more once the patient is seen. I made her aware and she voiced understanding.        KP

## 2013-07-20 ENCOUNTER — Telehealth: Payer: Self-pay | Admitting: *Deleted

## 2013-07-20 NOTE — Telephone Encounter (Signed)
UDS 07-20-13 LOW

## 2013-07-22 ENCOUNTER — Ambulatory Visit (INDEPENDENT_AMBULATORY_CARE_PROVIDER_SITE_OTHER): Payer: BC Managed Care – PPO | Admitting: Internal Medicine

## 2013-07-22 ENCOUNTER — Encounter: Payer: Self-pay | Admitting: Internal Medicine

## 2013-07-22 VITALS — BP 134/81 | HR 61 | Temp 98.0°F | Wt 158.0 lb

## 2013-07-22 DIAGNOSIS — E559 Vitamin D deficiency, unspecified: Secondary | ICD-10-CM

## 2013-07-22 DIAGNOSIS — IMO0001 Reserved for inherently not codable concepts without codable children: Secondary | ICD-10-CM

## 2013-07-22 DIAGNOSIS — R030 Elevated blood-pressure reading, without diagnosis of hypertension: Secondary | ICD-10-CM

## 2013-07-22 DIAGNOSIS — F519 Sleep disorder not due to a substance or known physiological condition, unspecified: Secondary | ICD-10-CM

## 2013-07-22 NOTE — Progress Notes (Signed)
Subjective:    Patient ID: Erin Fox, female    DOB: 19-Dec-1960, 53 y.o.   MRN: [id number]  DOS:  07/22/2013 Type of  Visit: ROV History: Insomnia, well-controlled with Xanax, recent UDS reviewed. Low vitamin D, status post ergocalciferol, not taking any supplements. Went to the dentist last week, BP was 158, today is better. She is somehow concerned about the 158 reading. Admits to not being active enough   ROS No anxiety or depression at this time Denies chest pain, difficulty breathing. No nausea, vomiting, diarrhea  Past Medical History  Diagnosis Date  . Hyperlipidemia   . H/O: hematuria     cysto 08-2009 @ urology  . Insomnia   . OAB (overactive bladder)   . HSV (herpes simplex virus) infection   . Diverticulosis     Past Surgical History  Procedure Laterality Date  . Tummy tuck  1990  . 5th toes resected bilateral    . Abdominal hysterectomy  1984    has one ovary  . Cesarean section    . Breast biopsy  2001    right  . Breast lumpectomy  2005    right  . Cystoscopy w/ dilation of bladder      History   Social History  . Marital Status: Divorced    Spouse Name: N/A    Number of Children: 1  . Years of Education: N/A   Occupational History  . quality technician    Social History Main Topics  . Smoking status: Current Every Day Smoker -- 0.30 packs/day for 33 years    Types: Cigarettes  . Smokeless tobacco: Never Used  . Alcohol Use: Yes     Comment: rarely   . Drug Use: No  . Sexual Activity: Not on file   Other Topics Concern  . Not on file   Social History Narrative   Lives by herself, currently living in a hotel-- diet: used to be better ---exercise: less than before          Medication List       This list is accurate as of: 07/22/13 11:02 AM.  Always use your most recent med list.               ALPRAZolam 0.5 MG tablet  Commonly known as:  XANAX  TAKE TWO TABLETS BY MOUTH AT BEDTIME AS NEEDED FOR SLEEP OR  ANXIETY     cholecalciferol 1000 UNITS tablet  Commonly known as:  VITAMIN D  Take 1,000 Units by mouth daily.     citalopram 20 MG tablet  Commonly known as:  CELEXA  TAKE ONE & ONE-HALF TABLETS BY MOUTH ONCE DAILY... PER DOCTOR OFFICE VISIT DUE FOR FURTHER REFILLS     fluticasone 50 MCG/ACT nasal spray  Commonly known as:  FLONASE  PLACE TWO SPRAY INTO THE NOSE EVERY DAY     simvastatin 20 MG tablet  Commonly known as:  ZOCOR  TAKE ONE TABLET BY MOUTH AT BEDTIME     valACYclovir 500 MG tablet  Commonly known as:  VALTREX  Take 1 tablet (500 mg total) by mouth 2 (two) times daily.           Objective:   Physical Exam BP 134/81  Pulse 61  Temp(Src) 98 F (36.7 C)  Wt 158 lb (71.668 kg)  SpO2 95% General -- alert, well-developed, NAD.  Neurologic--  alert & oriented X3. Speech normal, gait appropriate for age, strength symmetric and appropriate for age.  Psych-- Cognition and judgment appear intact. Cooperative with normal attention span and concentration. No anxious or depressed appearing.        Assessment & Plan:    BP was 158 at the dentist, slightly elevated, patient concerned, counseled, recommend to work on her exercise and eat healthy.  Today , I spent more than  15  min with the patient: >50% of the time counseling regards BP , need for vit D supplements Also reviewing the chart and labs

## 2013-07-22 NOTE — Assessment & Plan Note (Signed)
Vitamin D was low at 02-2013, status post ergocalciferol. Plan: Start taking vitamin D supplements daily, recheck on return to the office

## 2013-07-22 NOTE — Progress Notes (Signed)
Pre visit review using our clinic review tool, if applicable. No additional management support is needed unless otherwise documented below in the visit note. 

## 2013-07-22 NOTE — Assessment & Plan Note (Signed)
Symptoms well-controlled with Xanax. Used it is 07/20/2013 is normal. + cannabis  in her system. Plan: Refill Xanax as needed

## 2013-07-22 NOTE — Patient Instructions (Signed)
Take vit D 1000 untis a day  Call for xanax refills as needed  Next visit is for a physical exam by 02-2013 ,  fasting Please make an appointment

## 2013-07-26 ENCOUNTER — Encounter: Payer: Self-pay | Admitting: Internal Medicine

## 2013-09-11 ENCOUNTER — Other Ambulatory Visit: Payer: Self-pay | Admitting: Internal Medicine

## 2013-09-12 ENCOUNTER — Telehealth: Payer: Self-pay | Admitting: *Deleted

## 2013-09-12 MED ORDER — ALPRAZOLAM 0.5 MG PO TABS: ORAL_TABLET | ORAL | Status: DC

## 2013-09-12 NOTE — Telephone Encounter (Signed)
rx faxed to [facility].

## 2013-09-12 NOTE — Telephone Encounter (Signed)
rx printed

## 2013-09-12 NOTE — Telephone Encounter (Signed)
rx refill- xanax 0.5mg  Last OV- 07/22/13 Last refilled- 07/11/13  # 60 / 1 rf  UDS- 07/20/13 Low risk/.

## 2013-10-08 ENCOUNTER — Other Ambulatory Visit: Payer: Self-pay | Admitting: Internal Medicine

## 2013-10-13 ENCOUNTER — Encounter: Payer: Self-pay | Admitting: Gastroenterology

## 2013-11-08 ENCOUNTER — Other Ambulatory Visit: Payer: Self-pay

## 2013-11-08 MED ORDER — CITALOPRAM HYDROBROMIDE 20 MG PO TABS: ORAL_TABLET | ORAL | Status: DC

## 2013-12-12 ENCOUNTER — Other Ambulatory Visit: Payer: Self-pay

## 2013-12-12 ENCOUNTER — Telehealth: Payer: Self-pay

## 2013-12-12 DIAGNOSIS — Z1231 Encounter for screening mammogram for malignant neoplasm of breast: Secondary | ICD-10-CM

## 2013-12-12 NOTE — Telephone Encounter (Signed)
[facility]  Triage Call Report Triage Record Num: [id number] Operator: Erin Fox Patient Name: Erin Fox Call Date & Time: 12/11/2013 11:56:09AM Patient Phone: [phone] PCP: Patient Gender: Female PCP Fax : Patient DOB: 1960/09/17 Practice Name: [facility] [place] [facility]  Reason for Call: Caller: Erin Fox/Patient; PCP: Kathlene November; CB#: [phone]; Call regarding Medication Issue; Medication(s): Xanax; Spoke with Research scientist (physical sciences) at [facility] on [address]. Pt has valid refill of Xanax 0.5mg  available, but they are currently out of that dose. They are unable to transfer [facility] refill to another [facility]. The patient takes 2 tabs of 0.5mg  nightly for sleep, equals 1mg  of xanax per night. Authorized change to 1mg  tablets, to take 1 tablet nightly for sleep. No additional refills added. This will complete her refills. Spoke with  and advised that she will have a change to the 1mg  tablets and will take 1 tablet each night instead of two. Advised that this is her last refill and she will need to contact the office during business hours to obtain appointment/refill for next month. Pt verbalized understanding. Pharmacist will also mark the bottle as changed to remind pt of change to 1mg  tablet.  Protocol(s) Used: Medication Questions - Adult Recommended Outcome per Protocol: Speak with Provider or Pharmacist within 24 hours Reason for Outcome: Requests refill of prescribed medication with valid refills; lack of medications does not put patient at clinical risk Care Advice: ~

## 2013-12-13 ENCOUNTER — Encounter: Payer: Self-pay | Admitting: Gastroenterology

## 2013-12-20 ENCOUNTER — Telehealth: Payer: Self-pay | Admitting: Internal Medicine

## 2013-12-20 MED ORDER — ALPRAZOLAM 0.5 MG PO TABS: ORAL_TABLET | ORAL | Status: DC

## 2013-12-20 NOTE — Telephone Encounter (Signed)
No problem, #60 and 2 refill printed

## 2013-12-20 NOTE — Telephone Encounter (Signed)
Call pt: Please keep the appointment for CPX 01/02/2015, okay to refill   Xanax when needed

## 2013-12-20 NOTE — Telephone Encounter (Signed)
Pt would like to know if dr. Larose Kells can provide her 2 refills on rx ALPRAZolam (XANAX) 0.5 MG tablet until she comes for her CPE on 03/03/14. Pt has appt for med follow up on 01/04/14 states she can't get off work and will need to cancel the appt, however she has enough meds to last until dec and Jan.

## 2013-12-20 NOTE — Telephone Encounter (Signed)
Please advise 

## 2013-12-20 NOTE — Telephone Encounter (Signed)
Pt can cancel appt on 11/25 for med F/U, however, please keep CPX appt for January. Medication faxed to [facility] [place].

## 2013-12-20 NOTE — Telephone Encounter (Signed)
Spoke with Erin Fox, [facility] Dec and January refills, she has appt 11/25 for med refill F/U but doesn't have money to come, however, her CPX is 1/22. She is requesting refills for December and January until her CPX. Please advise.

## 2013-12-22 NOTE — Telephone Encounter (Signed)
Left detailed message on vm, advised any additional questions to please call the office.

## 2013-12-28 ENCOUNTER — Ambulatory Visit: Payer: BC Managed Care – PPO

## 2014-01-04 ENCOUNTER — Ambulatory Visit: Payer: BC Managed Care – PPO | Admitting: Internal Medicine

## 2014-01-09 ENCOUNTER — Other Ambulatory Visit: Payer: Self-pay

## 2014-01-09 MED ORDER — FLUTICASONE PROPIONATE 50 MCG/ACT NA SUSP: NASAL | Status: DC

## 2014-01-18 ENCOUNTER — Other Ambulatory Visit: Payer: Self-pay

## 2014-01-18 ENCOUNTER — Ambulatory Visit
Admission: RE | Admit: 2014-01-18 | Discharge: 2014-01-18 | Disposition: A | Discharge status: Home / Self Care | Payer: BC Managed Care – PPO | Source: Ambulatory Visit

## 2014-01-18 DIAGNOSIS — Z1231 Encounter for screening mammogram for malignant neoplasm of breast: Secondary | ICD-10-CM

## 2014-01-30 ENCOUNTER — Ambulatory Visit (AMBULATORY_SURGERY_CENTER): Payer: Self-pay | Admitting: *Deleted

## 2014-01-30 VITALS — Ht 67.5 in | Wt 152.0 lb

## 2014-01-30 DIAGNOSIS — Z86010 Personal history of colonic polyps: Secondary | ICD-10-CM

## 2014-01-30 MED ORDER — NA SULFATE-K SULFATE-MG SULF 17.5-3.13-1.6 GM/177ML PO SOLN: ORAL | Status: DC

## 2014-01-30 NOTE — Progress Notes (Signed)
Patient denies any allergies to eggs or soy. Patient denies any problems with anesthesia/sedation. Patient denies any oxygen use at home and does not take any diet/weight loss medications. EMMI education assisgned to patient on colonoscopy, this was explained and instructions given to patient. Patient states she is having "blood in urine and seeing urologist, for CT scan on Dec. 30 th." patient states she has had blood in urine off and on for many years, she thinks it is from "goody powder", she states she has stopped taking this for 2 weeks now. Encouraged patient to call us back if anything changes or any new problems or treatments.  Pt understands.

## 2014-02-07 ENCOUNTER — Other Ambulatory Visit: Payer: Self-pay

## 2014-02-07 MED ORDER — VALACYCLOVIR HCL 500 MG PO TABS: 500.0000 mg | ORAL_TABLET | Freq: Two times a day (BID) | ORAL | Status: DC

## 2014-02-07 MED ORDER — SIMVASTATIN 20 MG PO TABS: 20.0000 mg | ORAL_TABLET | Freq: Every day | ORAL | Status: DC

## 2014-02-07 MED ORDER — FLUTICASONE PROPIONATE 50 MCG/ACT NA SUSP: NASAL | Status: DC

## 2014-02-07 MED ORDER — CITALOPRAM HYDROBROMIDE 20 MG PO TABS: ORAL_TABLET | ORAL | Status: DC

## 2014-02-10 HISTORY — PX: OTHER SURGICAL HISTORY: SHX169

## 2014-02-13 ENCOUNTER — Other Ambulatory Visit: Payer: Self-pay

## 2014-02-13 MED ORDER — SIMVASTATIN 20 MG PO TABS: 20.0000 mg | ORAL_TABLET | Freq: Every day | ORAL | Status: DC

## 2014-02-14 ENCOUNTER — Ambulatory Visit (AMBULATORY_SURGERY_CENTER): Payer: BLUE CROSS/BLUE SHIELD | Admitting: Gastroenterology

## 2014-02-14 ENCOUNTER — Encounter: Payer: Self-pay | Admitting: Gastroenterology

## 2014-02-14 VITALS — BP 136/89 | HR 49 | Temp 98.4°F | Resp 23 | Ht 67.0 in | Wt 152.0 lb

## 2014-02-14 DIAGNOSIS — Z86010 Personal history of colonic polyps: Secondary | ICD-10-CM

## 2014-02-14 DIAGNOSIS — K5731 Diverticulosis of large intestine without perforation or abscess with bleeding: Secondary | ICD-10-CM

## 2014-02-14 DIAGNOSIS — K648 Other hemorrhoids: Secondary | ICD-10-CM

## 2014-02-14 MED ORDER — SODIUM CHLORIDE 0.9 % IV SOLN: 500.0000 mL | INTRAVENOUS | Status: DC

## 2014-02-14 NOTE — Progress Notes (Signed)
Stable to RR 

## 2014-02-14 NOTE — Patient Instructions (Signed)
YOU HAD AN ENDOSCOPIC PROCEDURE TODAY AT THE [facility]: Refer to the procedure report that was given to you for any specific questions about what was found during the examination.  If the procedure report does not answer your questions, please call your gastroenterologist to clarify.  If you requested that your care partner not be given the details of your procedure findings, then the procedure report has been included in a sealed envelope for you to review at your convenience later.  YOU SHOULD EXPECT: Some feelings of bloating in the abdomen. Passage of more gas than usual.  Walking can help get rid of the air that was put into your GI tract during the procedure and reduce the bloating. If you had a lower endoscopy (such as a colonoscopy or flexible sigmoidoscopy) you may notice spotting of blood in your stool or on the toilet paper. If you underwent a bowel prep for your procedure, then you may not have a normal bowel movement for a few days.  DIET: Your first meal following the procedure should be a light meal and then it is ok to progress to your normal diet.  A half-sandwich or bowl of soup is an example of a good first meal.  Heavy or fried foods are harder to digest and may make you feel nauseous or bloated.  Likewise meals heavy in dairy and vegetables can cause extra gas to form and this can also increase the bloating.  Drink plenty of fluids but you should avoid alcoholic beverages for 24 hours.  ACTIVITY: Your care partner should take you home directly after the procedure.  You should plan to take it easy, moving slowly for the rest of the day.  You can resume normal activity the day after the procedure however you should NOT DRIVE or use heavy machinery for 24 hours (because of the sedation medicines used during the test).    SYMPTOMS TO REPORT IMMEDIATELY: A gastroenterologist can be reached at any hour.  During normal business hours, 8:30 AM to 5:00 PM Monday through Friday,  call [phone].  After hours and on weekends, please call the GI answering service at [phone] who will take a message and have the physician on call contact you.   Following lower endoscopy (colonoscopy or flexible sigmoidoscopy):  Excessive amounts of blood in the stool  Significant tenderness or worsening of abdominal pains  Swelling of the abdomen that is new, acute  Fever of 100F or higher    FOLLOW UP: If any biopsies were taken you will be contacted by phone or by letter within the next 1-3 weeks.  Call your gastroenterologist if you have not heard about the biopsies in 3 weeks.  Our staff will call the home number listed on your records the next business day following your procedure to check on you and address any questions or concerns that you may have at that time regarding the information given to you following your procedure. This is a courtesy call and so if there is no answer at the home number and we have not heard from you through the emergency physician on call, we will assume that you have returned to your regular daily activities without incident.  SIGNATURES/CONFIDENTIALITY: You and/or your care partner have signed paperwork which will be entered into your electronic medical record.  These signatures attest to the fact that that the information above on your After Visit Summary has been reviewed and is understood.  Full responsibility of the confidentiality   of this discharge information lies with you and/or your care-partner.     

## 2014-02-14 NOTE — Op Note (Addendum)
Erin Fox  [address] [place], [id number]   COLONOSCOPY PROCEDURE REPORT  PATIENT: Erin Fox, Erin Fox  MR#: [id number] BIRTHDATE: 1960/08/14 , 53  yrs. old GENDER: female ENDOSCOPIST: Inda Castle, MD REFERRED EF:EOFH Larose Kells, M.D. PROCEDURE DATE:  02/14/2014 PROCEDURE:   Colonoscopy, diagnostic First Screening Colonoscopy - Avg.  risk and is 50 yrs.  old or older - No.  Prior Negative Screening - Now for repeat screening. N/A  History of Adenoma - Now for follow-up colonoscopy & has been > or = to 3 yrs.  Yes hx of adenoma.  Has been 3 or more years since last colonoscopy.  Polyps Removed Today? No.  Recommend repeat exam, <10 yrs? Yes.  High risk (family or personal hx). ASA CLASS:   Class II INDICATIONS:high risk personal history of colonic polyps.2010 MEDICATIONS: Monitored anesthesia care and Propofol 250 mg IV  DESCRIPTION OF PROCEDURE:   After the risks benefits and alternatives of the procedure were thoroughly explained, informed consent was obtained.  The digital rectal exam revealed no abnormalities of the rectum.   The LB [id number]  endoscope was introduced through the anus and advanced to the cecum, which was identified by both the appendix and ileocecal valve. No adverse events experienced.   The quality of the prep was excellent using Suprep  The instrument was then slowly withdrawn as the colon was fully examined.    COLON FINDINGS: Internal hemorrhoids were found.   The examination was otherwise normal.   There was mild diverticulosis noted in the sigmoid colon.  Retroflexed views revealed no abnormalities. The time to cecum=4 minutes 55 seconds.  Withdrawal time=8 minutes 36 seconds.  The scope was withdrawn and the procedure completed. COMPLICATIONS: There were no immediate complications.  ENDOSCOPIC IMPRESSION: 1.   The examination was otherwise normal 2.   Internal hemorrhoids 3.   Diverticulosis  RECOMMENDATIONS: Colonoscopy  7 years  eSigned:  Inda Castle, MD 02/14/2014 9:11 AM Revised: 02/14/2014 9:11 AM  cc:

## 2014-02-15 ENCOUNTER — Telehealth: Payer: Self-pay | Admitting: *Deleted

## 2014-02-15 NOTE — Telephone Encounter (Signed)
Number identifier, left message, follow-up  

## 2014-03-02 ENCOUNTER — Telehealth: Payer: Self-pay

## 2014-03-02 MED ORDER — ALPRAZOLAM 0.5 MG PO TABS: ORAL_TABLET | ORAL | Status: DC

## 2014-03-02 NOTE — Telephone Encounter (Signed)
Rx printed and signed by Dr. Larose Kells. Faxed to [facility].

## 2014-03-02 NOTE — Telephone Encounter (Signed)
[place] #60 and 1 Rf, please print the rx

## 2014-03-02 NOTE — Telephone Encounter (Signed)
Spoke with Pt, she rescheduled 03/03/2014, CPE to 04/11/2014 at 0830, first available. Pt informed me she would need a refill of Alprazolam before then. Informed her I would speak with Dr. Larose Kells about filling until appt date. Pt verbalized understanding.    Last fill on Alprazolam: 12/20/2013 # 60 and 2 RFs.  Please advise.

## 2014-03-03 ENCOUNTER — Encounter: Payer: BC Managed Care – PPO | Admitting: Internal Medicine

## 2014-03-06 ENCOUNTER — Other Ambulatory Visit: Payer: Self-pay | Admitting: Urology

## 2014-03-10 ENCOUNTER — Encounter (HOSPITAL_BASED_OUTPATIENT_CLINIC_OR_DEPARTMENT_OTHER): Payer: Self-pay | Admitting: *Deleted

## 2014-03-10 NOTE — Progress Notes (Signed)
NPO AFTER MN. ARRIVE AT 4196. NEEDS ISTAT 8. WILL DO FLONASE NASAL SPRAY AM DOS W/ SIPS OF WATER.

## 2014-03-17 ENCOUNTER — Encounter (HOSPITAL_BASED_OUTPATIENT_CLINIC_OR_DEPARTMENT_OTHER): Payer: Self-pay | Admitting: *Deleted

## 2014-03-17 ENCOUNTER — Ambulatory Visit (HOSPITAL_BASED_OUTPATIENT_CLINIC_OR_DEPARTMENT_OTHER): Payer: BLUE CROSS/BLUE SHIELD | Admitting: Anesthesiology

## 2014-03-17 ENCOUNTER — Encounter (HOSPITAL_BASED_OUTPATIENT_CLINIC_OR_DEPARTMENT_OTHER)
Admission: RE | Disposition: A | Discharge status: Home / Self Care | Payer: Self-pay | Source: Ambulatory Visit | Attending: Urology

## 2014-03-17 ENCOUNTER — Ambulatory Visit (HOSPITAL_BASED_OUTPATIENT_CLINIC_OR_DEPARTMENT_OTHER)
Admission: RE | Admit: 2014-03-17 | Discharge: 2014-03-17 | Disposition: A | Discharge status: Home / Self Care | Payer: BLUE CROSS/BLUE SHIELD | Source: Ambulatory Visit | Attending: Urology | Admitting: Urology

## 2014-03-17 DIAGNOSIS — F17210 Nicotine dependence, cigarettes, uncomplicated: Secondary | ICD-10-CM | POA: Insufficient documentation

## 2014-03-17 DIAGNOSIS — Z9889 Other specified postprocedural states: Secondary | ICD-10-CM | POA: Insufficient documentation

## 2014-03-17 DIAGNOSIS — G4700 Insomnia, unspecified: Secondary | ICD-10-CM | POA: Diagnosis not present

## 2014-03-17 DIAGNOSIS — R319 Hematuria, unspecified: Secondary | ICD-10-CM | POA: Diagnosis not present

## 2014-03-17 DIAGNOSIS — E785 Hyperlipidemia, unspecified: Secondary | ICD-10-CM | POA: Diagnosis not present

## 2014-03-17 DIAGNOSIS — R350 Frequency of micturition: Secondary | ICD-10-CM | POA: Insufficient documentation

## 2014-03-17 HISTORY — DX: Personal history of colonic polyps: Z86.010

## 2014-03-17 HISTORY — DX: Other specified disorders of bladder: N32.89

## 2014-03-17 HISTORY — PX: CYSTOSCOPY WITH BIOPSY: SHX5122

## 2014-03-17 HISTORY — PX: CYSTOSCOPY W/ RETROGRADES: SHX1426

## 2014-03-17 HISTORY — DX: Diverticulosis of large intestine without perforation or abscess without bleeding: K57.30

## 2014-03-17 HISTORY — DX: Hematuria, unspecified: R31.9

## 2014-03-17 HISTORY — DX: Calculus of kidney: N20.0

## 2014-03-17 HISTORY — DX: Presence of spectacles and contact lenses: Z97.3

## 2014-03-17 HISTORY — DX: Personal history of colon polyps, unspecified: Z86.0100

## 2014-03-17 LAB — POCT I-STAT, CHEM 8
BUN: 17 mg/dL (ref 6–23)
CALCIUM ION: 1.26 mmol/L — AB (ref 1.12–1.23)
CREATININE: 0.6 mg/dL (ref 0.50–1.10)
Chloride: 104 mmol/L (ref 96–112)
Glucose, Bld: 89 mg/dL (ref 70–99)
HCT: 43 % (ref 36.0–46.0)
HEMOGLOBIN: 14.6 g/dL (ref 12.0–15.0)
Potassium: 3.4 mmol/L — ABNORMAL LOW (ref 3.5–5.1)
Sodium: 143 mmol/L (ref 135–145)
TCO2: 21 mmol/L (ref 0–100)

## 2014-03-17 SURGERY — CYSTOSCOPY, WITH BIOPSY
Anesthesia: General | Site: Ureter

## 2014-03-17 MED ORDER — TRAMADOL HCL 50 MG PO TABS
50.0000 mg | ORAL_TABLET | Freq: Four times a day (QID) | ORAL | Status: DC | PRN
  Administered 2014-03-17: 50 mg via ORAL
  Filled 2014-03-17: qty 1

## 2014-03-17 MED ORDER — GENTAMICIN IN SALINE 1.6-0.9 MG/ML-% IV SOLN
80.0000 mg | INTRAVENOUS | Status: DC
  Filled 2014-03-17: qty 50

## 2014-03-17 MED ORDER — TRAMADOL HCL 50 MG PO TABS
ORAL_TABLET | ORAL | Status: AC
  Filled 2014-03-17: qty 1

## 2014-03-17 MED ORDER — STERILE WATER FOR IRRIGATION IR SOLN
Status: DC | PRN
  Administered 2014-03-17: 3000 mL

## 2014-03-17 MED ORDER — FENTANYL CITRATE 0.05 MG/ML IJ SOLN
INTRAMUSCULAR | Status: DC | PRN
  Administered 2014-03-17: 50 ug via INTRAVENOUS

## 2014-03-17 MED ORDER — FENTANYL CITRATE 0.05 MG/ML IJ SOLN
INTRAMUSCULAR | Status: AC
  Filled 2014-03-17: qty 4

## 2014-03-17 MED ORDER — KETOROLAC TROMETHAMINE 30 MG/ML IJ SOLN
INTRAMUSCULAR | Status: DC | PRN
  Administered 2014-03-17: 30 mg via INTRAVENOUS

## 2014-03-17 MED ORDER — IOHEXOL 350 MG/ML SOLN
INTRAVENOUS | Status: DC | PRN
  Administered 2014-03-17: 15 mL

## 2014-03-17 MED ORDER — ONDANSETRON HCL 4 MG/2ML IJ SOLN
INTRAMUSCULAR | Status: DC | PRN
  Administered 2014-03-17: 4 mg via INTRAVENOUS

## 2014-03-17 MED ORDER — MIDAZOLAM HCL 2 MG/2ML IJ SOLN
INTRAMUSCULAR | Status: AC
  Filled 2014-03-17: qty 2

## 2014-03-17 MED ORDER — MIDAZOLAM HCL 5 MG/5ML IJ SOLN
INTRAMUSCULAR | Status: DC | PRN
  Administered 2014-03-17: 2 mg via INTRAVENOUS

## 2014-03-17 MED ORDER — ACETAMINOPHEN 10 MG/ML IV SOLN
INTRAVENOUS | Status: DC | PRN
  Administered 2014-03-17: 1000 mg via INTRAVENOUS

## 2014-03-17 MED ORDER — DEXAMETHASONE SODIUM PHOSPHATE 4 MG/ML IJ SOLN
INTRAMUSCULAR | Status: DC | PRN
  Administered 2014-03-17: 10 mg via INTRAVENOUS

## 2014-03-17 MED ORDER — LACTATED RINGERS IV SOLN
INTRAVENOUS | Status: DC
  Administered 2014-03-17: 09:00:00 via INTRAVENOUS
  Filled 2014-03-17: qty 1000

## 2014-03-17 MED ORDER — PROPOFOL 10 MG/ML IV BOLUS
INTRAVENOUS | Status: DC | PRN
  Administered 2014-03-17: 200 mg via INTRAVENOUS

## 2014-03-17 MED ORDER — SENNOSIDES-DOCUSATE SODIUM 8.6-50 MG PO TABS: 1.0000 | ORAL_TABLET | Freq: Two times a day (BID) | ORAL | Status: DC

## 2014-03-17 MED ORDER — GENTAMICIN SULFATE 40 MG/ML IJ SOLN
5.0000 mg/kg | INTRAVENOUS | Status: AC
  Administered 2014-03-17: 310 mg via INTRAVENOUS
  Filled 2014-03-17: qty 7.75

## 2014-03-17 MED ORDER — LIDOCAINE HCL (CARDIAC) 20 MG/ML IV SOLN
INTRAVENOUS | Status: DC | PRN
  Administered 2014-03-17: 100 mg via INTRAVENOUS

## 2014-03-17 MED ORDER — TRAMADOL HCL 50 MG PO TABS: 50.0000 mg | ORAL_TABLET | Freq: Four times a day (QID) | ORAL | Status: DC | PRN

## 2014-03-17 SURGICAL SUPPLY — 27 items
BAG URO CATCHER STRL LF (DRAPE) ×3 IMPLANT
BASKET ZERO TIP NITINOL 2.4FR (BASKET) IMPLANT
CANISTER SUCT LVC 12 LTR MEDI- (MISCELLANEOUS) ×3 IMPLANT
CATH INTERMIT  6FR 70CM (CATHETERS) IMPLANT
CATH ROBINSON RED A/P 14FR (CATHETERS) IMPLANT
CLOTH BEACON ORANGE TIMEOUT ST (SAFETY) ×3 IMPLANT
DRAPE CAMERA CLOSED 9X96 (DRAPES) ×3 IMPLANT
ELECT REM PT RETURN 9FT ADLT (ELECTROSURGICAL) ×3
ELECTRODE REM PT RTRN 9FT ADLT (ELECTROSURGICAL) ×2 IMPLANT
GLOVE BIO SURGEON STRL SZ7.5 (GLOVE) ×3 IMPLANT
GLOVE BIOGEL PI IND STRL 7.5 (GLOVE) ×6 IMPLANT
GLOVE BIOGEL PI INDICATOR 7.5 (GLOVE) ×3
GLOVE SKINSENSE NS SZ6.5 (GLOVE) ×1
GLOVE SKINSENSE STRL SZ6.5 (GLOVE) ×2 IMPLANT
GLOVE SURG SS PI 7.5 STRL IVOR (GLOVE) ×3 IMPLANT
GOWN PREVENTION PLUS LG XLONG (DISPOSABLE) IMPLANT
GOWN PREVENTION PLUS XLARGE (GOWN DISPOSABLE) IMPLANT
GOWN STRL NON-REIN LRG LVL3 (GOWN DISPOSABLE) IMPLANT
GOWN STRL REUS W/TWL LRG LVL3 (GOWN DISPOSABLE) ×3 IMPLANT
GOWN STRL REUS W/TWL XL LVL3 (GOWN DISPOSABLE) ×6 IMPLANT
GUIDEWIRE ANG ZIPWIRE 038X150 (WIRE) IMPLANT
GUIDEWIRE STR DUAL SENSOR (WIRE) ×3 IMPLANT
IV NS IRRIG 3000ML ARTHROMATIC (IV SOLUTION) IMPLANT
NEEDLE HYPO 18GX1.5 BLUNT FILL (NEEDLE) IMPLANT
PACK CYSTO (CUSTOM PROCEDURE TRAY) ×3 IMPLANT
SYR 20CC LL (SYRINGE) IMPLANT
WATER STERILE IRR 3000ML UROMA (IV SOLUTION) ×6 IMPLANT

## 2014-03-17 NOTE — Anesthesia Preprocedure Evaluation (Signed)
Anesthesia Evaluation  Patient identified by MRN, date of birth, ID band Patient awake    Reviewed: Allergy & Precautions, NPO status , Patient's Chart, lab work & pertinent test results  Airway Mallampati: II  TM Distance: >3 FB Neck ROM: Full    Dental no notable dental hx.    Pulmonary neg pulmonary ROS, Current Smoker,  breath sounds clear to auscultation  Pulmonary exam normal       Cardiovascular negative cardio ROS  Rhythm:Regular Rate:Normal     Neuro/Psych PSYCHIATRIC DISORDERS Anxiety negative neurological ROS     GI/Hepatic negative GI ROS, Neg liver ROS,   Endo/Other  negative endocrine ROS  Renal/GU Renal disease     Musculoskeletal negative musculoskeletal ROS (+)   Abdominal   Peds  Hematology negative hematology ROS (+)   Anesthesia Other Findings   Reproductive/Obstetrics negative OB ROS                             Anesthesia Physical Anesthesia Plan  ASA: II  Anesthesia Plan: General   Post-op Pain Management:    Induction: Intravenous  Airway Management Planned: LMA  Additional Equipment:   Intra-op Plan:   Post-operative Plan: Extubation in OR  Informed Consent: I have reviewed the patients History and Physical, chart, labs and discussed the procedure including the risks, benefits and alternatives for the proposed anesthesia with the patient or authorized representative who has indicated his/her understanding and acceptance.   Dental advisory given  Plan Discussed with: CRNA  Anesthesia Plan Comments:         Anesthesia Quick Evaluation

## 2014-03-17 NOTE — Discharge Instructions (Signed)
1 - You may have urinary urgency (bladder spasms) and bloody urine on / off x few days. This is normal. ° °2 - Call MD or go to ER for fever >102, severe pain / nausea / vomiting not relieved by medications, or acute change in medical status ° °CYSTOSCOPY HOME CARE INSTRUCTIONS ° °Activity: °Rest for the remainder of the day.  Do not drive or operate equipment today.  You may resume normal activities in one to two days as instructed by your physician.  ° °Meals: °Drink plenty of liquids and eat light foods such as gelatin or soup this evening.  You may return to a normal meal plan tomorrow. ° °Return to Work: °You may return to work in one to two days or as instructed by your physician. ° °Special Instructions / Symptoms: °Call your physician if any of these symptoms occur: ° ° -persistent or heavy bleeding ° -bleeding which continues after first few urination ° -large blood clots that are difficult to pass ° -urine stream diminishes or stops completely ° -fever equal to or higher than 101 degrees Farenheit. ° -cloudy urine with a strong, foul odor ° -severe pain ° °Females should always wipe from front to back after elimination.  You may feel some burning pain when you urinate.  This should disappear with time.  Applying moist heat to the lower abdomen or a hot tub bath may help relieve the pain. \ ° °Follow-Up / Date of Return Visit to Your Physician:  °Call for an appointment to arrange follow-up. ° °Patient Signature:  ________________________________________________________ ° °Nurse's Signature:  ________________________________________________________ ° °Post Anesthesia Home Care Instructions ° °Activity: °Get plenty of rest for the remainder of the day. A responsible adult should stay with you for 24 hours following the procedure.  °For the next 24 hours, DO NOT: °-Drive a car °-Operate machinery °-Drink alcoholic beverages °-Take any medication unless instructed by your physician °-Make any legal decisions  or sign important papers. ° °Meals: °Start with liquid foods such as gelatin or soup. Progress to regular foods as tolerated. Avoid greasy, spicy, heavy foods. If nausea and/or vomiting occur, drink only clear liquids until the nausea and/or vomiting subsides. Call your physician if vomiting continues. ° °Special Instructions/Symptoms: °Your throat may feel dry or sore from the anesthesia or the breathing tube placed in your throat during surgery. If this causes discomfort, gargle with warm salt water. The discomfort should disappear within 24 hours. ° °

## 2014-03-17 NOTE — Transfer of Care (Signed)
Immediate Anesthesia Transfer of Care Note  Patient: Erin Fox  Procedure(s) Performed: Procedure(s): CYSTOSCOPY WITH BIOPSY AND FULGERATION (N/A) CYSTOSCOPY WITH RETROGRADE PYELOGRAM (Bilateral)  Patient Location: PACU  Anesthesia Type:General  Level of Consciousness: awake, alert , oriented and patient cooperative  Airway & Oxygen Therapy: Patient Spontanous Breathing and Patient connected to nasal cannula oxygen  Post-op Assessment: Report given to RN and Post -op Vital signs reviewed and stable  Post vital signs: Reviewed and stable  Last Vitals:  Filed Vitals:   03/17/14 0819  BP: 112/80  Pulse: 60  Temp: 36.9 C  Resp: 16    Complications: No apparent anesthesia complications

## 2014-03-17 NOTE — Anesthesia Procedure Notes (Signed)
Procedure Name: LMA Insertion Date/Time: 03/17/2014 10:00 AM Performed by: Bethena Roys T Pre-anesthesia Checklist: Patient identified, Emergency Drugs available, Suction available and Patient being monitored Patient Re-evaluated:Patient Re-evaluated prior to inductionOxygen Delivery Method: Circle System Utilized Preoxygenation: Pre-oxygenation with 100% oxygen Intubation Type: IV induction Ventilation: Mask ventilation without difficulty LMA: LMA inserted LMA Size: 4.0 Number of attempts: 1 Airway Equipment and Method: bite block Placement Confirmation: positive ETCO2 Dental Injury: Teeth and Oropharynx as per pre-operative assessment

## 2014-03-17 NOTE — Anesthesia Postprocedure Evaluation (Signed)
Anesthesia Post Note  Patient: Erin Fox  Procedure(s) Performed: Procedure(s) (LRB): CYSTOSCOPY WITH BIOPSY AND FULGERATION (N/A) CYSTOSCOPY WITH RETROGRADE PYELOGRAM (Bilateral)  Anesthesia type: General  Patient location: PACU  Post pain: Pain level controlled  Post assessment: Post-op Vital signs reviewed  Last Vitals: BP 117/79 mmHg  Pulse 55  Temp(Src) 36.5 C (Oral)  Resp 16  Ht 5' 7.5" (1.715 m)  Wt 147 lb 8 oz (66.906 kg)  BMI 22.75 kg/m2  SpO2 100%  Post vital signs: Reviewed  Level of consciousness: sedated  Complications: No apparent anesthesia complications

## 2014-03-17 NOTE — Brief Op Note (Signed)
03/17/2014  10:27 AM  PATIENT:  Ulyses Amor  54 y.o. female  PRE-OPERATIVE DIAGNOSIS:  HEMATURIA, BLADDER ERYTHEMA  POST-OPERATIVE DIAGNOSIS:  HEMATURIA, BLADDER ERYTHEMA  PROCEDURE:  Procedure(s): CYSTOSCOPY WITH BIOPSY AND FULGERATION (N/A) CYSTOSCOPY WITH RETROGRADE PYELOGRAM (Bilateral)  SURGEON:  Surgeon(s) and Role:    * Alexis Frock, MD - Primary  PHYSICIAN ASSISTANT:   ASSISTANTS: none   ANESTHESIA:   general  EBL:  Total I/O In: 850 [I.V.:850] Out: -   BLOOD ADMINISTERED:none  DRAINS: none   LOCAL MEDICATIONS USED:  NONE  SPECIMEN:  Source of Specimen:  bladder biopsy  DISPOSITION OF SPECIMEN:  PATHOLOGY  COUNTS:  YES  TOURNIQUET:  * No tourniquets in log *  DICTATION: .Other Dictation: Dictation Number [id number]  PLAN OF CARE: Discharge to home after PACU  PATIENT DISPOSITION:  PACU - hemodynamically stable.   Delay start of Pharmacological VTE agent (>24hrs) due to surgical blood loss or risk of bleeding: yes

## 2014-03-17 NOTE — H&P (Signed)
Erin Fox is an 54 y.o. female.    Chief Complaint: Pre-op Cysto, retrogrades, bladder biopsy  HPI:  1 - Hematuria - on off microscopic and gross hematuria since pre 2011. Smoker, still smokes. Had eval 2011 by [facility] with imaging, labs, cysto, FISH that was unremarkable. Repeat eval 02/2014 with CT and cysto (by [facility]) with some bladder dome erythema worrisome for possible carcioma in situ and impressive burden of pelvic phleboltihs, some appearing intramural in bladder (esp left lateral and dome).  2 - Phlebolithiasis - pt with very large volume pelvic phleboliths by CT 2006 and 2016 including some that appear possibly intramural in bladder.  PMH sig for anxiety, hyst (still has one ovary), foot surgery. Her PCP is Kathlene November MD  Today Laverta is seen to proceed with cyso and bladder biopsy. NO interval fevers.   Past Medical History  Diagnosis Date  . Hyperlipidemia   . Insomnia   . OAB (overactive bladder)   . HSV (herpes simplex virus) infection   . Anxiety   . Hematuria   . Erythematous bladder mucosa   . History of colon polyps   . Diverticulosis of colon   . Nephrolithiasis     NON-OBSTRUCTIVE  . Wears glasses   . Frequency of urination     Past Surgical History  Procedure Laterality Date  . 5th toes resected bilateral  1990's    bone's removed  . Cesarean section    . Abdominoplasty  1990  . Breast biopsy Right 01-02-2004  . Removal bartholin cyst/ gland and revision left labia  06-10-2004  . Cysto/  retrograde pyelogram/  left ureteroscopy/  bladder biopsy  08-27-2004  . Colonoscopy w/ polypectomy  last one 02-14-2014  . Abdominal hysterectomy  1984    W/ UNILATERAL SALPINGOOPHORECTOMY    Family History  Problem Relation Age of Onset  . Hyperlipidemia Mother   . Hypertension Mother     M and sister   . Diabetes Father   . Cancer Paternal Aunt     lung  . Colon cancer      uncle , dx at age 79s  . Breast cancer Neg Hx   . Colon polyps Sister  53    8 polyps removed  . High blood pressure Sister   . Colon cancer Paternal Uncle    Social History:  reports that she has been smoking Cigarettes.  She has a 15 pack-year smoking history. She has never used smokeless tobacco. She reports that she drinks alcohol. She reports that she uses illicit drugs (Marijuana).  Allergies:  Allergies  Allergen Reactions  . Latex Rash    No prescriptions prior to admission    No results found for this or any previous visit (from the past 48 hour(s)). No results found.  Review of Systems  Constitutional: Negative.   HENT: Negative.   Respiratory: Negative.   Cardiovascular: Negative.  Negative for chest pain.  Gastrointestinal: Negative.  Negative for vomiting.  Genitourinary: Negative.  Negative for flank pain.  Musculoskeletal: Negative.   Skin: Negative.   Neurological: Negative.   Endo/Heme/Allergies: Negative.   Psychiatric/Behavioral: Negative.     Height 5' 7.5" (1.715 m), weight 70.308 kg (155 lb). Physical Exam  Constitutional: She appears well-developed.  HENT:  Head: Normocephalic.  Eyes: Pupils are equal, round, and reactive to light.  Cardiovascular: Normal rate.   Respiratory: Effort normal.  GI: Soft.  Genitourinary:  No CVAT  Musculoskeletal: Normal range of motion.  Neurological: She is alert.  Skin: Skin is warm.  Psychiatric: She has a normal mood and affect. Her behavior is normal. Judgment and thought content normal.     Assessment/Plan  1 - Hematuria - I have reviewed the patients labs and imaging and agree operative cysto, bladder biopsies clearly warranted to rule out carcinoma, especially in setting of recurrent hematuria in smoker.   We rediscussed operative biopsy / transurethral resection as the best next step for diagnostic and therapeutic purposes with goals being to remove all visible cancer and obtain tissue for pathologic exam. We rediscussed that for some low-grade tumors, this may be all  the treatment required, but that for many other tumors such as high-grade lesions, further therapy including surgery and or chemotherapy may be warranted. We also reoutlined the fact that any bladder cancer diagnosis will require close follow-up with periodic upper and lower tract evaluation. We discussed risks including bleeding, infection, damage to kidney / ureter / bladder including bladder perforation which can typically managed with prolonged foley catheterization. We rementioned anesthetic and other rare risks including DVT, PE, MI, and mortality. I also mentioned that adjunctive procedures such as ureteral stenting, retrograde pyelography, and ureteroscopy may be necessary to fully evaluate the urinary tract depending on intra-operative findings.   After answering all questions to the patient's satisfaction, they wish to proceed today as planned.  2 - Phlebolithiasis - non specific finding and approximately stable by imaging, operative cysto / biopsy as per above to rule out intramural bladder stones as possible cause.   Onnie Alatorre 03/17/2014, 6:19 AM

## 2014-03-20 NOTE — Op Note (Signed)
NAMEMICAYLAH, BERTUCCI NO.:  [id number]  MEDICAL RECORD NO.:  [id number]  LOCATION:                                 FACILITY:  PHYSICIAN:  Alexis Frock, MD     DATE OF BIRTH:  [birth year]  DATE OF PROCEDURE:  03/17/2014 DATE OF DISCHARGE:  03/17/2014                              OPERATIVE REPORT   PREOPERATIVE DIAGNOSES:  Hematuria, smoking history, bladder erythema.  PROCEDURES: 1. Cystoscopy with bilateral retrograde pyelogram interpretation. 2. Bladder biopsy and fulguration.  ESTIMATED BLOOD LOSS:  Nil.  COMPLICATIONS:  None.  SPECIMENS:  Areas of bladder erythema, random, cold cup biopsies for permanent pathology.  FINDINGS: 1. Unremarkable bilateral retrograde pyelogram. 2. Diffuse erythema with questionable very subtle papillary changes     diffusely in the bladder, most predominantly at the posterior wall     and dome, but also extending to the right wall, left wall, and     trigone area.  Representative areas were cold cup biopsied. 3. No evidence of bladder perforation.  There was excellent hemostasis     following biopsy and fulguration.  INDICATION:  Ms. Bellmore is a very pleasant 54 year old lady with history of recurrent hematuria.  She underwent negative evaluation years ago. She subsequently presented with repeat hematuria.  She does have a significant smoking history.  She underwent office evaluation by my colleague, Dr. McDiarmid, which include an axial upper tract imaging, which was unremarkable; however, office cystoscopy did reveal some multifocal patchy erythema, which is concerning for possible carcinoma in situ or other etiology.  She also has a significant amount of pelvic phlebolithiasis.  In order to better evaluate recurrent hematuria and most importantly to rule out carcinoma in situ, it was felt that operative biopsy with fulguration was clearly warranted.  Informed consent was obtained and placed in medical  record.  PROCEDURE IN DETAIL:  The patient being [place] was verified. Procedure being cysto, retrogrades, and bladder biopsies confirmed. Procedure was carried out.  Time-out was performed.  Intravenous antibiotics were administered.  General LMA anesthesia was induced.  The patient was placed into a low lithotomy position.  Sterile field was created by prepping and draping the patient's vagina, introitus, and proximal thighs using iodine x3.  Next, cystourethroscopy was performed using a 22-French rigid cystoscope with 30-degree offset lens. Inspection of the urinary bladder revealed multifocal erythema with some very, very subtle possible papillary changes.  This was mostly at the dome and posterior wall; however, there was some involvement of the bilateral lateral wall as well as inter-trigone area, especially medial to the left ureteral orifice.  There were no frank papillary masses or nodules.  There were no intramural phleboliths.  Attention was directed to retrograde pyelogram.  First, the left ureteral orifice was cannulated with a 6-French end-hole catheter and left retrograde pyelogram was obtained.  Left retrograde pyelogram demonstrated a single left ureter, single system left kidney.  No filling defects or narrowing noted.  Similarly, a right retrograde pyelogram was obtained.  Right retrograde pyelogram demonstrated single right ureter, single system right kidney.  No filling defects or narrowing noted.  Next, a cold cup biopsy forceps were used to  obtain representative seromuscular biopsies of the area of erythema, 1 bite inter-trigone and another one posterior wall more towards the left and another posterior wall more towards the right, and these were all set aside for permanent pathology together, labeled on bladder biopsy.  Bugbee electrode was used to apply point coagulation current to the biopsy sites, resulted in excellent hemostasis, and there was no  evidence of bladder perforation and hemostasis was excellent.  There was visualized efflux of urine from bilateral ureteral orifices following biopsy and fulguration.  Bladder was emptied per cystoscope.  Procedure was then terminated.  The patient tolerated the procedure well.  There were no immediate periprocedural complications.  The patient was taken to postanesthesia care unit in stable condition.          ______________________________ Alexis Frock, MD     TM/MEDQ  D:  03/17/2014  T:  03/17/2014  Job:  [id number]

## 2014-03-21 ENCOUNTER — Encounter (HOSPITAL_BASED_OUTPATIENT_CLINIC_OR_DEPARTMENT_OTHER): Payer: Self-pay | Admitting: Urology

## 2014-04-10 ENCOUNTER — Other Ambulatory Visit: Payer: Self-pay

## 2014-04-11 ENCOUNTER — Encounter: Payer: Self-pay | Admitting: Internal Medicine

## 2014-04-11 ENCOUNTER — Ambulatory Visit (INDEPENDENT_AMBULATORY_CARE_PROVIDER_SITE_OTHER): Payer: BLUE CROSS/BLUE SHIELD | Admitting: Internal Medicine

## 2014-04-11 VITALS — BP 127/82 | HR 62 | Temp 98.2°F | Ht 68.0 in | Wt 152.5 lb

## 2014-04-11 DIAGNOSIS — E559 Vitamin D deficiency, unspecified: Secondary | ICD-10-CM | POA: Diagnosis not present

## 2014-04-11 DIAGNOSIS — Z0000 Encounter for general adult medical examination without abnormal findings: Secondary | ICD-10-CM | POA: Diagnosis not present

## 2014-04-11 DIAGNOSIS — G4700 Insomnia, unspecified: Secondary | ICD-10-CM

## 2014-04-11 DIAGNOSIS — E785 Hyperlipidemia, unspecified: Secondary | ICD-10-CM | POA: Diagnosis not present

## 2014-04-11 DIAGNOSIS — M549 Dorsalgia, unspecified: Secondary | ICD-10-CM | POA: Insufficient documentation

## 2014-04-11 DIAGNOSIS — R319 Hematuria, unspecified: Secondary | ICD-10-CM

## 2014-04-11 DIAGNOSIS — Z23 Encounter for immunization: Secondary | ICD-10-CM

## 2014-04-11 DIAGNOSIS — M545 Low back pain, unspecified: Secondary | ICD-10-CM

## 2014-04-11 DIAGNOSIS — F411 Generalized anxiety disorder: Secondary | ICD-10-CM

## 2014-04-11 LAB — CBC WITH DIFFERENTIAL/PLATELET
Basophils Absolute: 0 10*3/uL (ref 0.0–0.1)
Basophils Relative: 0.6 % (ref 0.0–3.0)
EOS PCT: 2.7 % (ref 0.0–5.0)
Eosinophils Absolute: 0.1 10*3/uL (ref 0.0–0.7)
HCT: 37.1 % (ref 36.0–46.0)
Hemoglobin: 12.6 g/dL (ref 12.0–15.0)
LYMPHS PCT: 33.5 % (ref 12.0–46.0)
Lymphs Abs: 1.3 10*3/uL (ref 0.7–4.0)
MCHC: 34 g/dL (ref 30.0–36.0)
MCV: 93.3 fl (ref 78.0–100.0)
MONO ABS: 0.5 10*3/uL (ref 0.1–1.0)
Monocytes Relative: 11.4 % (ref 3.0–12.0)
NEUTROS PCT: 51.8 % (ref 43.0–77.0)
Neutro Abs: 2.1 10*3/uL (ref 1.4–7.7)
PLATELETS: 212 10*3/uL (ref 150.0–400.0)
RBC: 3.98 Mil/uL (ref 3.87–5.11)
RDW: 14.6 % (ref 11.5–15.5)
WBC: 4 10*3/uL (ref 4.0–10.5)

## 2014-04-11 LAB — LIPID PANEL
CHOL/HDL RATIO: 3
Cholesterol: 197 mg/dL (ref 0–200)
HDL: 72.5 mg/dL (ref >39.00)
LDL CALC: 109 mg/dL — AB (ref 0–99)
NONHDL: 124.5
TRIGLYCERIDES: 80 mg/dL (ref 0.0–149.0)
VLDL: 16 mg/dL (ref 0.0–40.0)

## 2014-04-11 LAB — BASIC METABOLIC PANEL
BUN: 14 mg/dL (ref 6–23)
CHLORIDE: 107 meq/L (ref 96–112)
CO2: 31 meq/L (ref 19–32)
CREATININE: 0.7 mg/dL (ref 0.40–1.20)
Calcium: 9.2 mg/dL (ref 8.4–10.5)
GFR: 112.95 mL/min (ref >60.00)
Glucose, Bld: 89 mg/dL (ref 70–99)
Potassium: 3.4 mEq/L — ABNORMAL LOW (ref 3.5–5.1)
Sodium: 141 mEq/L (ref 135–145)

## 2014-04-11 LAB — ALT: ALT: 12 U/L (ref 0–35)

## 2014-04-11 LAB — VITAMIN D 25 HYDROXY (VIT D DEFICIENCY, FRACTURES): VITD: 18.25 ng/mL — ABNORMAL LOW (ref 30.00–100.00)

## 2014-04-11 LAB — AST: AST: 16 U/L (ref 0–37)

## 2014-04-11 MED ORDER — TOLTERODINE TARTRATE 2 MG PO TABS: 2.0000 mg | ORAL_TABLET | Freq: Every evening | ORAL | Status: DC | PRN

## 2014-04-11 NOTE — Assessment & Plan Note (Signed)
Due for labs

## 2014-04-11 NOTE — Assessment & Plan Note (Signed)
Ongoing, right-sided low back pain, refer to sports medicine

## 2014-04-11 NOTE — Assessment & Plan Note (Signed)
Insomnia, improved with Valeria 2 qhs. Self decreased the Xanax to 1 tablet at nighttime , likes  to continue decreasing the dose, okay to try 1/2 tab qhs

## 2014-04-11 NOTE — Assessment & Plan Note (Addendum)
Td 06   zostavax discussed before   PAP done 2012, never had an abnormal one; h/o hysterectomy for benign reasons, not sexually active. Per guidelines no need for further paps but will consider a pelvic exam in 2017   Mammogram 01-2014: negative Breast exam 1- 2015 neg  Colonoscopy November 2010: Polyps, diverticula, hemorrhoids. cscope 1-16, no polyps, next 7 years per report Dr Deatra Ina.   discussed quitting tobacco, diet and exercise Labs

## 2014-04-11 NOTE — Patient Instructions (Signed)
Get your blood work before you leave   Continue decreasing Xanax, okay to go down to half tablet at bedtime Decreased citalopram to half tablet daily, after 2 months discontinue it.   Please go to the front and schedule a visit  In 4 months for  a checkup no fasting

## 2014-04-11 NOTE — Assessment & Plan Note (Signed)
Self Decreased Citalopram from 30 Mg to 20 Mg Back in December, Likes to Continue Decreasing. Plan: Celexa 10 Mg for 2 Months Then Discontinue

## 2014-04-11 NOTE — Assessment & Plan Note (Addendum)
Status post extensive workup, eventually had a biopsy 03-2014, see report below. Biopsy showed no cancer. Also, occasional nocturia, likes to try again Detrol, prescription sent ---- Biopsy 03-2014: INDICATION: Ms. Erin Fox is a very pleasant 54 year old lady with history of recurrent hematuria. She underwent negative evaluation years ago. She subsequently presented with repeat hematuria. She does have a significant smoking history. She underwent office evaluation by my colleague, Dr. McDiarmid, which include an axial upper tract imaging, which was unremarkable; however, office cystoscopy did reveal some multifocal patchy erythema, which is concerning for possible carcinoma in situ or other etiology. She also has a significant amount of pelvic phlebolithiasis. In order to better evaluate recurrent hematuria and most importantly to rule out carcinoma in situ, it was felt that operative biopsy with fulguration was clearly warranted. Informed consent was obtained and placed in medical record.

## 2014-04-11 NOTE — Progress Notes (Signed)
Pre visit review using our clinic review tool, if applicable. No additional management support is needed unless otherwise documented below in the visit note. 

## 2014-04-11 NOTE — Progress Notes (Signed)
Subjective:    Patient ID: Erin Fox, female    DOB: January 31, 1961, 54 y.o.   MRN: [id number]  DOS:  04/11/2014 Type of visit - description : cpx Interval history, we discussed a number of other issues: Hematuria, workup was negative. Insomnia improved, she decreased Xanax dose and likes to continue decreasing it, on Valeria OTC. Anxiety improve, self decreased citalopram dose. Back pain, right side, persistent. Continue with nocturia, restart Detrol?    Review of Systems Constitutional: No fever, chills. No unexplained wt changes. No unusual sweats HEENT: No dental problems, ear discharge, facial swelling, voice changes. No eye discharge, redness or intolerance to light Respiratory: No wheezing or difficulty breathing. No cough , mucus production Cardiovascular: No CP, nor palpitations. History of varicose veins, continue with "edema". GI: no nausea, vomiting, diarrhea or abdominal pain.  No blood in the stools. No dysphagia   Endocrine: No polyphagia, polyuria or polydipsia GU: No dysuria, difficulty urinating.   Musculoskeletal: No joint swellings or unusual aches or pains (except for back pain) Skin: No change in the color of the skin, palor or rash Allergic, immunologic: No environmental allergies or food allergies Neurological: No dizziness or syncope. No headaches. No diplopia, slurred speech, motor deficits, facial numbness Hematological: No enlarged lymph nodes, easy bruising or bleeding Psychiatry: No suicidal ideas, hallucinations, behavior problems or confusion.   Past Medical History  Diagnosis Date  . Hyperlipidemia   . Insomnia   . OAB (overactive bladder)   . HSV (herpes simplex virus) infection   . Anxiety   . Hematuria     w/u neg ~ 01-2014, 03-2014  . Erythematous bladder mucosa   . History of colon polyps   . Diverticulosis of colon   . Nephrolithiasis     NON-OBSTRUCTIVE  . Wears glasses   . Frequency of urination     Past Surgical History    Procedure Laterality Date  . 5th toes resected bilateral  1990's    bone's removed  . Cesarean section    . Abdominoplasty  1990  . Breast biopsy Right 01-02-2004  . Removal bartholin cyst/ gland and revision left labia  06-10-2004  . Cysto/  retrograde pyelogram/  left ureteroscopy/  bladder biopsy  08-27-2004  . Colonoscopy w/ polypectomy  last one 02-14-2014  . Abdominal hysterectomy  1984    W/ UNILATERAL SALPINGOOPHORECTOMY  . Cystoscopy with biopsy N/A 03/17/2014    Procedure: CYSTOSCOPY WITH BIOPSY AND FULGERATION;  Surgeon: Alexis Frock, MD;  Location: [facility];  Service: Urology;  Laterality: N/A;  . Cystoscopy w/ retrogrades Bilateral 03/17/2014    Procedure: CYSTOSCOPY WITH RETROGRADE PYELOGRAM;  Surgeon: Alexis Frock, MD;  Location: [facility];  Service: Urology;  Laterality: Bilateral;    History   Social History  . Marital Status: Divorced    Spouse Name: N/A  . Number of Children: 1  . Years of Education: N/A   Occupational History  . quality technician    Social History Main Topics  . Smoking status: Current Every Day Smoker -- 0.50 packs/day for 30 years    Types: Cigarettes  . Smokeless tobacco: Never Used  . Alcohol Use: Yes     Comment: OCCASIONAL  . Drug Use: Yes    Special: Marijuana     Comment: "every other weekend" per pt.  . Sexual Activity: Not on file   Other Topics Concern  . Not on file   Social History Narrative   Lives  W/ her  sister      Family History  Problem Relation Age of Onset  . Hyperlipidemia Mother   . Hypertension Mother     M and sister   . Diabetes Father   . Cancer Paternal Aunt     lung  . Colon cancer Other     uncle , dx at age 51s  . Breast cancer Neg Hx   . Colon polyps Sister 98    8 polyps removed  . High blood pressure Sister   . Colon cancer Paternal Uncle        Medication List       This list is accurate as of: 04/11/14  5:32 PM.  Always use your most recent  med list.               ALPRAZolam 0.5 MG tablet  Commonly known as:  XANAX  Take 0.5 mg by mouth at bedtime as needed for anxiety or sleep.     citalopram 20 MG tablet  Commonly known as:  CELEXA  Take 10 mg by mouth daily.     fluticasone 50 MCG/ACT nasal spray  Commonly known as:  FLONASE  PLACE TWO SPRAY INTO THE NOSE EVERY DAY     senna-docusate 8.6-50 MG per tablet  Commonly known as:  Senokot-S  Take 1 tablet by mouth 2 (two) times daily. While taking pain meds to prevent constipation     simvastatin 20 MG tablet  Commonly known as:  ZOCOR  Take 1 tablet (20 mg total) by mouth at bedtime.     tolterodine 2 MG tablet  Commonly known as:  DETROL  Take 1 tablet (2 mg total) by mouth at bedtime as needed.     valACYclovir 500 MG tablet  Commonly known as:  VALTREX  Take 1 tablet (500 mg total) by mouth 2 (two) times daily.     VALERIAN ROOT PO  Take 1 tablet by mouth at bedtime.           Objective:   Physical Exam BP 127/82 mmHg  Pulse 62  Temp(Src) 98.2 F (36.8 C) (Oral)  Ht 5\' 8"  (1.727 m)  Wt 152 lb 8 oz (69.174 kg)  BMI 23.19 kg/m2  SpO2 97% General:   Well developed, well nourished . NAD.  Neck:  Full range of motion. Supple. No  Thyromegaly  HEENT:  Normocephalic . Face symmetric, atraumatic Lungs:  CTA B Normal respiratory effort, no intercostal retractions, no accessory muscle use. Heart: RRR,  no murmur.  Abdomen:  Not distended, soft, non-tender. No rebound or rigidity. No mass,organomegaly Muscle skeletal: no pretibial pitting edema bilaterally  Skin: Exposed areas without rash. Not pale. Not jaundice Neurologic:  alert & oriented X3.  Speech normal, gait appropriate for age and unassisted Strength symmetric and appropriate for age.  Psych: Cognition and judgment appear intact.  Cooperative with normal attention span and concentration.  Behavior appropriate. No anxious or depressed appearing.       Assessment & Plan:    In  addition to her physical exam, today I spent more than  15  min with the patient in unrelated issues: Insomnia, anxiety, reviewing chart for hematuria workup, back pain

## 2014-04-12 LAB — HIV ANTIBODY (ROUTINE TESTING W REFLEX): HIV 1&2 Ab, 4th Generation: NONREACTIVE

## 2014-04-13 MED ORDER — VITAMIN D (ERGOCALCIFEROL) 1.25 MG (50000 UNIT) PO CAPS: 50000.0000 [IU] | ORAL_CAPSULE | ORAL | Status: DC

## 2014-04-13 NOTE — Addendum Note (Signed)
Addended by: Wilfrid Lund on: 04/13/2014 03:55 PM   Modules accepted: Orders

## 2014-04-14 ENCOUNTER — Ambulatory Visit (INDEPENDENT_AMBULATORY_CARE_PROVIDER_SITE_OTHER): Payer: BLUE CROSS/BLUE SHIELD | Admitting: Family Medicine

## 2014-04-14 ENCOUNTER — Encounter: Payer: Self-pay | Admitting: Family Medicine

## 2014-04-14 VITALS — BP 124/84 | Ht 67.0 in | Wt 153.0 lb

## 2014-04-14 DIAGNOSIS — M545 Low back pain, unspecified: Secondary | ICD-10-CM

## 2014-04-14 NOTE — Patient Instructions (Signed)
You have strain/spasms of lumbar paraspinal muscles. Take tylenol for baseline pain relief (1-2 extra strength tabs 3x/day) Aleve 2 tabs twice a day with food for pain and inflammation. Consider pain medication, muscle relaxants. Stay as active as possible. Do home exercises and stretches as directed - hold each for 20-30 seconds and do each one three times. Start physical therapy and transition to home exercises. Strengthening of low back muscles, abdominal musculature are key for long term pain relief. If not improving, will consider further imaging (MRI). Follow up with me in 1 month to 6 weeks for reevaluation.

## 2014-04-18 ENCOUNTER — Encounter: Payer: Self-pay | Admitting: Family Medicine

## 2014-04-18 NOTE — Assessment & Plan Note (Signed)
2/2 strain of lumbar paraspinal muscles on right side.  Tylenol, nsaids.  Start physical therapy and home exercise program.  F/u in 1 month to 6 weeks.  If not improving as expected would consider MRI.

## 2014-04-18 NOTE — Progress Notes (Signed)
PCP and referred by: Kathlene November, MD  Subjective:   HPI: Patient is a 54 y.o. female here for low back pain.  Patient states she has had low back pain more on right side for about 6 months. Unsure if related to the bed she has been sleeping in - suspects this is the case. No numbness or tingling. No known injury. Sometimes goes into the right thigh and groin. Radiographs at [facility] negative but ? Constipation only. Had hematuria but completed full workup including CT with contrast - has f/u with urology in a year to recheck. No bowel/bladder dysfunction.  Past Medical History  Diagnosis Date  . Hyperlipidemia   . Insomnia   . OAB (overactive bladder)   . HSV (herpes simplex virus) infection   . Anxiety   . Hematuria     w/u neg ~ 01-2014, 03-2014  . Erythematous bladder mucosa   . History of colon polyps   . Diverticulosis of colon   . Nephrolithiasis     NON-OBSTRUCTIVE  . Wears glasses     Current Outpatient Prescriptions on File Prior to Visit  Medication Sig Dispense Refill  . ALPRAZolam (XANAX) 0.5 MG tablet Take 0.5 mg by mouth at bedtime as needed for anxiety or sleep.    . citalopram (CELEXA) 20 MG tablet Take 10 mg by mouth daily.    . simvastatin (ZOCOR) 20 MG tablet Take 1 tablet (20 mg total) by mouth at bedtime. 30 tablet 2  . VALERIAN ROOT PO Take 1 tablet by mouth at bedtime.    . fluticasone (FLONASE) 50 MCG/ACT nasal spray PLACE TWO SPRAY INTO THE NOSE EVERY DAY (Patient taking differently: Place 2 sprays into both nostrils every morning. PLACE TWO SPRAY INTO THE NOSE EVERY DAY) 16 g 3  . senna-docusate (SENOKOT-S) 8.6-50 MG per tablet Take 1 tablet by mouth 2 (two) times daily. While taking pain meds to prevent constipation (Patient not taking: Reported on 04/11/2014) 30 tablet 0  . tolterodine (DETROL) 2 MG tablet Take 1 tablet (2 mg total) by mouth at bedtime as needed. 30 tablet 3  . valACYclovir (VALTREX) 500 MG tablet Take 1 tablet (500 mg total) by mouth 2  (two) times daily. (Patient taking differently: Take 500 mg by mouth 2 (two) times daily as needed. ) 180 tablet 1  . Vitamin D, Ergocalciferol, (DRISDOL) 50000 UNITS CAPS capsule Take 1 capsule (50,000 Units total) by mouth every 7 (seven) days. 12 capsule 0   No current facility-administered medications on file prior to visit.    Past Surgical History  Procedure Laterality Date  . 5th toes resected bilateral  1990's    bone's removed  . Cesarean section    . Abdominoplasty  1990  . Breast biopsy Right 01-02-2004  . Removal bartholin cyst/ gland and revision left labia  06-10-2004  . Cysto/  retrograde pyelogram/  left ureteroscopy/  bladder biopsy  08-27-2004  . Colonoscopy w/ polypectomy  last one 02-14-2014  . Abdominal hysterectomy  1984    W/ UNILATERAL SALPINGOOPHORECTOMY  . Cystoscopy with biopsy N/A 03/17/2014    Procedure: CYSTOSCOPY WITH BIOPSY AND FULGERATION;  Surgeon: Alexis Frock, MD;  Location: [facility];  Service: Urology;  Laterality: N/A;  . Cystoscopy w/ retrogrades Bilateral 03/17/2014    Procedure: CYSTOSCOPY WITH RETROGRADE PYELOGRAM;  Surgeon: Alexis Frock, MD;  Location: [facility];  Service: Urology;  Laterality: Bilateral;    Allergies  Allergen Reactions  . Latex Rash  History   Social History  . Marital Status: Divorced    Spouse Name: N/A  . Number of Children: 1  . Years of Education: N/A   Occupational History  . quality technician    Social History Main Topics  . Smoking status: Current Every Day Smoker -- 0.50 packs/day for 30 years    Types: Cigarettes  . Smokeless tobacco: Never Used  . Alcohol Use: Yes     Comment: OCCASIONAL  . Drug Use: Yes    Special: Marijuana     Comment: "every other weekend" per pt.  . Sexual Activity: Not on file   Other Topics Concern  . Not on file   Social History Narrative   Lives  W/ her sister     Family History  Problem Relation Age of Onset  .  Hyperlipidemia Mother   . Hypertension Mother     M and sister   . Diabetes Father   . Cancer Paternal Aunt     lung  . Colon cancer Other     uncle , dx at age 21s  . Breast cancer Neg Hx   . Colon polyps Sister 58    8 polyps removed  . High blood pressure Sister   . Colon cancer Paternal Uncle     BP 124/84 mmHg  Ht 5\' 7"  (1.702 m)  Wt 153 lb (69.4 kg)  BMI 23.96 kg/m2  Review of Systems: See HPI above.    Objective:  Physical Exam:  Gen: NAD  Back: No gross deformity, scoliosis. TTP mildly right lumbar paraspinal region.  No midline or bony TTP. FROM with pain on trunk rotation, less flexion. Strength LEs 5/5 all muscle groups.   2+ MSRs in patellar and achilles tendons, equal bilaterally. Negative SLRs. Sensation intact to light touch bilaterally. Negative logroll bilateral hips Negative fabers and piriformis stretches.    Assessment & Plan:  1. Low back pain - 2/2 strain of lumbar paraspinal muscles on right side.  Tylenol, nsaids.  Start physical therapy and home exercise program.  F/u in 1 month to 6 weeks.  If not improving as expected would consider MRI.

## 2014-04-25 ENCOUNTER — Encounter: Payer: Self-pay | Admitting: Physical Therapy

## 2014-04-25 ENCOUNTER — Ambulatory Visit: Payer: BLUE CROSS/BLUE SHIELD | Attending: Family Medicine | Admitting: Physical Therapy

## 2014-04-25 DIAGNOSIS — R29898 Other symptoms and signs involving the musculoskeletal system: Secondary | ICD-10-CM

## 2014-04-25 DIAGNOSIS — M6289 Other specified disorders of muscle: Secondary | ICD-10-CM | POA: Insufficient documentation

## 2014-04-25 DIAGNOSIS — M545 Low back pain, unspecified: Secondary | ICD-10-CM

## 2014-04-25 NOTE — Therapy (Addendum)
[facility] [address]  [place], [place], [address] Phone: [phone]   Fax:  [phone]  Physical Therapy Evaluation  Patient Details  Name: Erin Fox MRN: [id number] Date of Birth: [birth year] Referring Provider:  Dene Gentry, MD  Encounter Date: 04/25/2014      PT End of Session - 04/25/14 1147    Visit Number 1   Number of Visits 8   Date for PT Re-Evaluation 05/23/14   PT Start Time 1025   PT Stop Time 1103   PT Time Calculation (min) 38 min      Past Medical History  Diagnosis Date  . Hyperlipidemia   . Insomnia   . OAB (overactive bladder)   . HSV (herpes simplex virus) infection   . Anxiety   . Hematuria     w/u neg ~ 01-2014, 03-2014  . Erythematous bladder mucosa   . History of colon polyps   . Diverticulosis of colon   . Nephrolithiasis     NON-OBSTRUCTIVE  . Wears glasses     Past Surgical History  Procedure Laterality Date  . 5th toes resected bilateral  1990's    bone's removed  . Cesarean section    . Abdominoplasty  1990  . Breast biopsy Right 01-02-2004  . Removal bartholin cyst/ gland and revision left labia  06-10-2004  . Cysto/  retrograde pyelogram/  left ureteroscopy/  bladder biopsy  08-27-2004  . Colonoscopy w/ polypectomy  last one 02-14-2014  . Abdominal hysterectomy  1984    W/ UNILATERAL SALPINGOOPHORECTOMY  . Cystoscopy with biopsy N/A 03/17/2014    Procedure: CYSTOSCOPY WITH BIOPSY AND FULGERATION;  Surgeon: Alexis Frock, MD;  Location: [facility];  Service: Urology;  Laterality: N/A;  . Cystoscopy w/ retrogrades Bilateral 03/17/2014    Procedure: CYSTOSCOPY WITH RETROGRADE PYELOGRAM;  Surgeon: Alexis Frock, MD;  Location: [facility];  Service: Urology;  Laterality: Bilateral;    There were no vitals filed for this visit.  Visit Diagnosis:  Right-sided low back pain without sciatica - Plan: PT plan of care  cert/re-cert  Weakness of both hips - Plan: PT plan of care cert/re-cert      Subjective Assessment - 04/25/14 1029    Symptoms pt late for PT eval, reduced time available for assessment/treatment today.  States noted onset of LBP 2 months and believes is due to mattress she is using at her sister's house for past 2 years.  Otherwise no known MOI.   Patient Stated Goals be painfree   Currently in Pain? No/denies  states pain is intermittent, not currently in pain   Pain Location Back   Pain Onset More than a month ago   Pain Frequency Several days a week  states notes LBP 3-4 days/week   Aggravating Factors  being in bed   Pain Relieving Factors heat, aspirin, aleve   Effect of Pain on Daily Activities no sig limitations, no dificulty sleeping   Multiple Pain Sites No            [facility] PT Assessment - 04/25/14 0001    Assessment   Medical Diagnosis R LBP   Onset Date 01/10/14   Balance Screen   Has the patient fallen in the past 6 months No   Has the patient had a decrease in activity level because of a fear of falling?  No   Is the patient reluctant to leave their home because of a  fear of falling?  No   Prior Function   Level of Independence Independent with homemaking with ambulation   Vocation Full time employment   Vocation Requirements Job requires frequent lifting 15-20#  from knee to chin height, denies pain with work   Observation/Other Assessments   Focus on Therapeutic Outcomes (FOTO)  pt arrived too late to complete prior to eval, will perform at first f/u   Functional Tests   Functional tests Squat   Squat   Comments pt with forward wt shift lifting heels off floor.  Slow with returning to standing from squat due to LE weakness, no c/o pain.   Posture/Postural Control   Posture Comments R pelvic innominant slightly superior/posterior vs L.   ROM / Strength   AROM / PROM / Strength AROM;Strength   AROM   Overall AROM Comments B LE and Lumbar WNL   Strength    Overall Strength Comments B Hip ER 4-/5, Flexion 4/5 , ABD 4/5, Ext 4/5, IR 5/5.  No pain with MMT.   Flexibility   Soft Tissue Assessment /Muscle Length yes  B LE WNL   Palpation   Palpation slight TTP R IC and into R SI.  When asked where  pt typically notes pain (painfree today) she points to superior aspect of R IC.   Special Tests    Special Tests Lumbar;Sacrolliac Tests   Sacroiliac Tests  --   FABER test   findings Negative   Slump test   Findings Negative   Prone Knee Bend Test   Findings Negative   Straight Leg Raise   Findings Negative             TODAY'S TREATMENT: TherEx - HEP instruct and perform - Bridge 10x, side-lying clam Green TB 10x each               PT Education - 04/25/14 1147    Education provided Yes   Education Details initial HEP   Person(s) Educated Patient   Methods Explanation;Demonstration;Handout   Comprehension Returned demonstration;Verbalized understanding          PT Short Term Goals - 04/25/14 1152    PT SHORT TERM GOAL #1   Title pt independent with initial hep by 05/03/14   Status New           PT Long Term Goals - 04/25/14 1152    PT LONG TERM GOAL #1   Title pt independent with advanced HEP as necessary by 05/26/14   Status New   PT LONG TERM GOAL #2   Title B Hip MMT improves to 4+/5 or better grossly by 05/26/14   Status New   PT LONG TERM GOAL #3   Title pt displays good squat mechanics by 05/26/14   Status New   PT LONG TERM GOAL #4   Title pt states pain no greater than 2/10 and noted no more than 1 day/week by 05/26/14.   Status New               Plan - 04/25/14 1148    Clinical Impression Statement pt with c/o intermittent LBP however no pain present today.  Lumbar and B LE AROM and Flexibility normal but sig weakness noted in B hips which may be contributing to pain especially given active nature of pt's job and her denying any mode of exercise.  Only significant fincings today are  pelvic innominant assymetry and poor squat mechanics.  Given intermittent nature of pain not confident that  pelvic assymetry plays a role.   Pt will benefit from skilled therapeutic intervention in order to improve on the following deficits Pain;Decreased strength;Postural dysfunction   Rehab Potential Excellent   PT Frequency 2x / week  1-2x/wk   PT Duration 4 weeks   PT Treatment/Interventions Therapeutic exercise;Patient/family education;Therapeutic activities;Electrical Stimulation;Moist Heat;Traction;Manual techniques;Dry needling;Ultrasound   PT Next Visit Plan progress HEP and stability training as able   Consulted and Agree with Plan of Care Patient         Problem List Patient Active Problem List   Diagnosis Date Noted  . Hematuria 04/11/2014  . Back pain 04/11/2014  . Vitamin D deficiency 07/22/2013  . Annual physical exam 11/05/2010  . HIP PAIN, LEFT 01/01/2010  . Anxiety state 08/03/2009  . PERSISTENT DISORDER INITIATING/MAINTAINING SLEEP 10/13/2008  . Insomnia 11/11/2006  . COLONOSCOPY, HX OF 11/11/2006  . HSV 05/22/2006  . HYPERLIPIDEMIA 05/22/2006  . VENOUS INSUFFICIENCY 05/22/2006    Josian Lanese PT, OCS 04/25/2014, 11:57 AM  [facility] [address]  [place], [place], [address] Phone: [phone]   Fax:  [phone]   PHYSICAL THERAPY DISCHARGE SUMMARY  Visits from Start of Care: 1  Current functional level related to goals / functional outcomes: Unknown   Remaining deficits: unknown   Education / Equipment: Initial HEP  Plan: Patient agrees to discharge.  Patient goals were partially met. Patient is being discharged due to being pleased with the current functional level.  ?????        Erin Fox attended her initial evaluation on 04/25/14 then called on 05/01/14 and cancelled all remaining appointments stating HEP instructed at initial evaluation were helping.  She was advised that  we would hold her chart for 30 days and if her pain returned or if she had any questions to contact us in that timeframe.  We have not heard back from here and are therefore discharging her from our care at this time.  Leonette Most PT, OCS 06/13/2014 2:11 PM

## 2014-05-02 ENCOUNTER — Ambulatory Visit: Payer: BLUE CROSS/BLUE SHIELD | Admitting: Rehabilitation

## 2014-05-04 ENCOUNTER — Encounter: Payer: BLUE CROSS/BLUE SHIELD | Admitting: Rehabilitation

## 2014-05-07 ENCOUNTER — Other Ambulatory Visit: Payer: Self-pay | Admitting: Internal Medicine

## 2014-05-09 ENCOUNTER — Encounter: Payer: BLUE CROSS/BLUE SHIELD | Admitting: Physical Therapy

## 2014-05-11 ENCOUNTER — Encounter: Payer: BLUE CROSS/BLUE SHIELD | Admitting: Rehabilitation

## 2014-05-13 ENCOUNTER — Other Ambulatory Visit: Payer: Self-pay | Admitting: Internal Medicine

## 2014-05-15 ENCOUNTER — Other Ambulatory Visit: Payer: Self-pay

## 2014-05-15 MED ORDER — CITALOPRAM HYDROBROMIDE 20 MG PO TABS: 10.0000 mg | ORAL_TABLET | Freq: Every day | ORAL | Status: DC

## 2014-05-16 ENCOUNTER — Encounter: Payer: BLUE CROSS/BLUE SHIELD | Admitting: Physical Therapy

## 2014-05-18 ENCOUNTER — Encounter: Payer: BLUE CROSS/BLUE SHIELD | Admitting: Rehabilitation

## 2014-05-19 ENCOUNTER — Ambulatory Visit: Payer: BLUE CROSS/BLUE SHIELD | Admitting: Family Medicine

## 2014-06-06 DIAGNOSIS — L720 Epidermal cyst: Secondary | ICD-10-CM | POA: Insufficient documentation

## 2014-08-10 ENCOUNTER — Other Ambulatory Visit: Payer: Self-pay

## 2014-08-11 ENCOUNTER — Ambulatory Visit (INDEPENDENT_AMBULATORY_CARE_PROVIDER_SITE_OTHER): Payer: BLUE CROSS/BLUE SHIELD | Admitting: Internal Medicine

## 2014-08-11 ENCOUNTER — Encounter: Payer: Self-pay | Admitting: Internal Medicine

## 2014-08-11 VITALS — BP 122/78 | HR 55 | Temp 98.1°F | Ht 67.0 in | Wt 149.0 lb

## 2014-08-11 DIAGNOSIS — G4700 Insomnia, unspecified: Secondary | ICD-10-CM

## 2014-08-11 DIAGNOSIS — E785 Hyperlipidemia, unspecified: Secondary | ICD-10-CM

## 2014-08-11 DIAGNOSIS — E559 Vitamin D deficiency, unspecified: Secondary | ICD-10-CM

## 2014-08-11 DIAGNOSIS — F411 Generalized anxiety disorder: Secondary | ICD-10-CM

## 2014-08-11 LAB — VITAMIN D 25 HYDROXY (VIT D DEFICIENCY, FRACTURES): VITD: 33.27 ng/mL (ref 30.00–100.00)

## 2014-08-11 MED ORDER — VALACYCLOVIR HCL 500 MG PO TABS: 500.0000 mg | ORAL_TABLET | Freq: Two times a day (BID) | ORAL | Status: DC

## 2014-08-11 MED ORDER — SIMVASTATIN 20 MG PO TABS: 20.0000 mg | ORAL_TABLET | Freq: Every day | ORAL | Status: DC

## 2014-08-11 MED ORDER — FLUTICASONE PROPIONATE 50 MCG/ACT NA SUSP: 2.0000 | Freq: Every day | NASAL | Status: DC

## 2014-08-11 NOTE — Patient Instructions (Signed)
Go to the lab.

## 2014-08-11 NOTE — Progress Notes (Signed)
Pre visit review using our clinic review tool, if applicable. No additional management support is needed unless otherwise documented below in the visit note. 

## 2014-08-11 NOTE — Assessment & Plan Note (Signed)
Status post ergocalciferol, currently taking vitamin D OTC 2000 units most days Labs

## 2014-08-11 NOTE — Assessment & Plan Note (Signed)
Patient taper off citalopram, currently doing well, doing great on her job.

## 2014-08-11 NOTE — Assessment & Plan Note (Signed)
Doing great without Xanax or valeria

## 2014-08-11 NOTE — Assessment & Plan Note (Addendum)
Good compliance with simvastatin,RF medications

## 2014-08-11 NOTE — Progress Notes (Signed)
Subjective:    Patient ID: Erin Fox, female    DOB: 09/08/1960, 54 y.o.   MRN: [id number]  DOS:  08/11/2014 Type of visit - description : Follow-up Interval history: Anxiety, insomnia: Doing great, not taking citalopram. Low vitamin D, status post ergocalciferol, now on OTCs. Due for labs High cholesterol, good compliance with simvastatin   Review of Systems  Not exercising much, diet about the same. Denies chest pain, difficulty breathing. No nausea, vomiting, diarrhea  Past Medical History  Diagnosis Date  . Hyperlipidemia   . Insomnia   . OAB (overactive bladder)   . HSV (herpes simplex virus) infection   . Anxiety   . Hematuria     w/u neg ~ 01-2014, 03-2014  . Erythematous bladder mucosa   . History of colon polyps   . Diverticulosis of colon   . Nephrolithiasis     NON-OBSTRUCTIVE  . Wears glasses     Past Surgical History  Procedure Laterality Date  . 5th toes resected bilateral  1990's    bone's removed  . Cesarean section    . Abdominoplasty  1990  . Breast biopsy Right 01-02-2004  . Removal bartholin cyst/ gland and revision left labia  06-10-2004  . Cysto/  retrograde pyelogram/  left ureteroscopy/  bladder biopsy  08-27-2004  . Colonoscopy w/ polypectomy  last one 02-14-2014  . Abdominal hysterectomy  1984    W/ UNILATERAL SALPINGOOPHORECTOMY  . Cystoscopy with biopsy N/A 03/17/2014    Procedure: CYSTOSCOPY WITH BIOPSY AND FULGERATION;  Surgeon: Alexis Frock, MD;  Location: [facility];  Service: Urology;  Laterality: N/A;  . Cystoscopy w/ retrogrades Bilateral 03/17/2014    Procedure: CYSTOSCOPY WITH RETROGRADE PYELOGRAM;  Surgeon: Alexis Frock, MD;  Location: [facility];  Service: Urology;  Laterality: Bilateral;  . Other surgical history Left 2016    Cyst Removal on Left ear    History   Social History  . Marital Status: Divorced    Spouse Name: N/A  . Number of Children: 1  . Years of Education: N/A    Occupational History  . quality technician    Social History Main Topics  . Smoking status: Current Every Day Smoker -- 0.50 packs/day for 30 years    Types: Cigarettes  . Smokeless tobacco: Never Used  . Alcohol Use: Yes     Comment: OCCASIONAL  . Drug Use: Yes    Special: Marijuana     Comment: "every other weekend" per pt.  . Sexual Activity: Not on file   Other Topics Concern  . Not on file   Social History Narrative   Lives  W/ her sister         Medication List       This list is accurate as of: 08/11/14 11:59 PM.  Always use your most recent med list.               fluticasone 50 MCG/ACT nasal spray  Commonly known as:  FLONASE  Place 2 sprays into both nostrils daily.     simvastatin 20 MG tablet  Commonly known as:  ZOCOR  Take 1 tablet (20 mg total) by mouth at bedtime.     valACYclovir 500 MG tablet  Commonly known as:  VALTREX  Take 1 tablet (500 mg total) by mouth 2 (two) times daily.           Objective:   Physical Exam BP 122/78 mmHg  Pulse 55  Temp(Src) 98.1  F (36.7 C) (Oral)  Ht 5\' 7"  (1.702 m)  Wt 149 lb (67.586 kg)  BMI 23.33 kg/m2  SpO2 97% General:   Well developed, well nourished . NAD.  HEENT:  Normocephalic . Face symmetric, atraumatic Lungs:  CTA B Normal respiratory effort, no intercostal retractions, no accessory muscle use. Heart: RRR,  no murmur.  No pretibial edema bilaterally  Skin: Not pale. Not jaundice Neurologic:  alert & oriented X3.  Speech normal, gait appropriate for age and unassisted Psych--  Cognition and judgment appear intact.  Cooperative with normal attention span and concentration.  Behavior appropriate. No anxious or depressed appearing.    Assessment & Plan:

## 2014-09-15 ENCOUNTER — Encounter: Payer: Self-pay | Admitting: Gastroenterology

## 2015-03-06 ENCOUNTER — Other Ambulatory Visit: Payer: Self-pay

## 2015-03-06 DIAGNOSIS — Z1231 Encounter for screening mammogram for malignant neoplasm of breast: Secondary | ICD-10-CM

## 2015-03-21 ENCOUNTER — Ambulatory Visit
Admission: RE | Admit: 2015-03-21 | Discharge: 2015-03-21 | Disposition: A | Discharge status: Home / Self Care | Payer: BLUE CROSS/BLUE SHIELD | Source: Ambulatory Visit

## 2015-03-21 DIAGNOSIS — Z1231 Encounter for screening mammogram for malignant neoplasm of breast: Secondary | ICD-10-CM

## 2015-05-05 ENCOUNTER — Emergency Department (HOSPITAL_BASED_OUTPATIENT_CLINIC_OR_DEPARTMENT_OTHER)
Admission: EM | Admit: 2015-05-05 | Discharge: 2015-05-05 | Disposition: A | Discharge status: Home / Self Care | Payer: BLUE CROSS/BLUE SHIELD | Attending: Emergency Medicine | Admitting: Emergency Medicine

## 2015-05-05 ENCOUNTER — Encounter (HOSPITAL_BASED_OUTPATIENT_CLINIC_OR_DEPARTMENT_OTHER): Payer: Self-pay | Admitting: Emergency Medicine

## 2015-05-05 DIAGNOSIS — L42 Pityriasis rosea: Secondary | ICD-10-CM | POA: Insufficient documentation

## 2015-05-05 DIAGNOSIS — Z87448 Personal history of other diseases of urinary system: Secondary | ICD-10-CM | POA: Insufficient documentation

## 2015-05-05 DIAGNOSIS — Z7951 Long term (current) use of inhaled steroids: Secondary | ICD-10-CM | POA: Insufficient documentation

## 2015-05-05 DIAGNOSIS — Z8669 Personal history of other diseases of the nervous system and sense organs: Secondary | ICD-10-CM | POA: Diagnosis not present

## 2015-05-05 DIAGNOSIS — Z8619 Personal history of other infectious and parasitic diseases: Secondary | ICD-10-CM | POA: Insufficient documentation

## 2015-05-05 DIAGNOSIS — R21 Rash and other nonspecific skin eruption: Secondary | ICD-10-CM

## 2015-05-05 DIAGNOSIS — F17210 Nicotine dependence, cigarettes, uncomplicated: Secondary | ICD-10-CM | POA: Diagnosis not present

## 2015-05-05 DIAGNOSIS — Z8719 Personal history of other diseases of the digestive system: Secondary | ICD-10-CM | POA: Diagnosis not present

## 2015-05-05 DIAGNOSIS — E785 Hyperlipidemia, unspecified: Secondary | ICD-10-CM | POA: Diagnosis not present

## 2015-05-05 DIAGNOSIS — Z86010 Personal history of colonic polyps: Secondary | ICD-10-CM | POA: Insufficient documentation

## 2015-05-05 DIAGNOSIS — Z973 Presence of spectacles and contact lenses: Secondary | ICD-10-CM | POA: Diagnosis not present

## 2015-05-05 DIAGNOSIS — Z87442 Personal history of urinary calculi: Secondary | ICD-10-CM | POA: Insufficient documentation

## 2015-05-05 DIAGNOSIS — Z8659 Personal history of other mental and behavioral disorders: Secondary | ICD-10-CM | POA: Diagnosis not present

## 2015-05-05 DIAGNOSIS — Z91040 Latex allergy status: Secondary | ICD-10-CM | POA: Insufficient documentation

## 2015-05-05 DIAGNOSIS — Z79899 Other long term (current) drug therapy: Secondary | ICD-10-CM | POA: Insufficient documentation

## 2015-05-05 MED ORDER — TRIAMCINOLONE ACETONIDE 0.1 % EX CREA: 1.0000 "application " | TOPICAL_CREAM | Freq: Two times a day (BID) | CUTANEOUS | Status: DC

## 2015-05-05 MED ORDER — ACYCLOVIR 200 MG PO CAPS: 1000.0000 mg | ORAL_CAPSULE | Freq: Every day | ORAL | Status: DC

## 2015-05-05 MED ORDER — VALACYCLOVIR HCL 500 MG PO TABS: 500.0000 mg | ORAL_TABLET | Freq: Two times a day (BID) | ORAL | Status: DC

## 2015-05-05 NOTE — ED Provider Notes (Signed)
CSN: [id number]     Arrival date & time 05/05/15  1302 History   First MD Initiated Contact with Patient 05/05/15 1411     Chief Complaint  Patient presents with  . Rash     (Consider location/radiation/quality/duration/timing/severity/associated sxs/prior Treatment) Patient is a 55 y.o. female presenting with rash. The history is provided by the patient.  Rash Location:  Shoulder/arm Shoulder/arm rash location:  R shoulder Quality: dryness, itchiness, redness and scaling   Severity:  Moderate Onset quality:  Gradual Duration:  1 day (1 week ago larger area on right upper arm appeared following viral URI) Timing:  Constant Progression:  Worsening Chronicity:  New Context: not food, not medications and not sun exposure   Relieved by:  Nothing Worsened by:  Nothing tried Ineffective treatments:  None tried Associated symptoms: no abdominal pain, no fever, no shortness of breath, not vomiting and not wheezing     Past Medical History  Diagnosis Date  . Hyperlipidemia   . Insomnia   . OAB (overactive bladder)   . HSV (herpes simplex virus) infection   . Anxiety   . Hematuria     w/u neg ~ 01-2014, 03-2014  . Erythematous bladder mucosa   . History of colon polyps   . Diverticulosis of colon   . Nephrolithiasis     NON-OBSTRUCTIVE  . Wears glasses   . Cyst on ear     Left ear   Past Surgical History  Procedure Laterality Date  . 5th toes resected bilateral  1990's    bone's removed  . Cesarean section    . Abdominoplasty  1990  . Breast biopsy Right 01-02-2004  . Removal bartholin cyst/ gland and revision left labia  06-10-2004  . Cysto/  retrograde pyelogram/  left ureteroscopy/  bladder biopsy  08-27-2004  . Colonoscopy w/ polypectomy  last one 02-14-2014  . Abdominal hysterectomy  1984    W/ UNILATERAL SALPINGOOPHORECTOMY  . Cystoscopy with biopsy N/A 03/17/2014    Procedure: CYSTOSCOPY WITH BIOPSY AND FULGERATION;  Surgeon: Alexis Frock, MD;  Location: [facility];  Service: Urology;  Laterality: N/A;  . Cystoscopy w/ retrogrades Bilateral 03/17/2014    Procedure: CYSTOSCOPY WITH RETROGRADE PYELOGRAM;  Surgeon: Alexis Frock, MD;  Location: [facility];  Service: Urology;  Laterality: Bilateral;  . Other surgical history Left 2016    Cyst Removal on Left ear   Family History  Problem Relation Age of Onset  . Hyperlipidemia Mother   . Hypertension Mother     M and sister   . Diabetes Father   . Cancer Paternal Aunt     lung  . Colon cancer Other     uncle , dx at age 22s  . Breast cancer Neg Hx   . Colon polyps Sister 17    8 polyps removed  . High blood pressure Sister   . Colon cancer Paternal Uncle    Social History  Substance Use Topics  . Smoking status: Current Every Day Smoker -- 0.50 packs/day for 30 years    Types: Cigarettes  . Smokeless tobacco: Never Used  . Alcohol Use: Yes     Comment: OCCASIONAL   OB History    No data available     Review of Systems  Constitutional: Negative for fever.  Respiratory: Negative for shortness of breath and wheezing.   Gastrointestinal: Negative for vomiting and abdominal pain.  Skin: Positive for rash.  All other systems reviewed and are negative.  Allergies  Latex  Home Medications   Prior to Admission medications   Medication Sig Start Date End Date Taking? Authorizing Provider  fluticasone (FLONASE) 50 MCG/ACT nasal spray Place 2 sprays into both nostrils daily. 08/11/14   Colon Branch, MD  simvastatin (ZOCOR) 20 MG tablet Take 1 tablet (20 mg total) by mouth at bedtime. 08/11/14   Colon Branch, MD  valACYclovir (VALTREX) 500 MG tablet Take 1 tablet (500 mg total) by mouth 2 (two) times daily. 08/11/14   Colon Branch, MD   BP 144/96 mmHg  Pulse 91  Temp(Src) 98.9 F (37.2 C) (Oral)  Resp 18  Ht 5' 7.5" (1.715 m)  Wt 153 lb (69.4 kg)  BMI 23.60 kg/m2  SpO2 99% Physical Exam  Constitutional: She is oriented to person, place, and time. She  appears well-developed and well-nourished. No distress.  HENT:  Head: Normocephalic.  Eyes: Conjunctivae are normal.  Neck: Neck supple. No tracheal deviation present.  Cardiovascular: Normal rate, regular rhythm and normal heart sounds.   Pulmonary/Chest: Effort normal and breath sounds normal. No respiratory distress.  Abdominal: Soft. She exhibits no distension.  Neurological: She is alert and oriented to person, place, and time.  Skin: Skin is warm and dry.  2 cm scaling raised patch over right upper shoulder. Spreading diffuse macular lesions of similar character noted over her torso and extremities, back of neck beneath hair  Psychiatric: She has a normal mood and affect.  Vitals reviewed.   ED Course  Procedures (including critical care time) Labs Review Labs Reviewed - No data to display  Imaging Review No results found. I have personally reviewed and evaluated these images and lab results as part of my medical decision-making.   EKG Interpretation None      MDM   Final diagnoses:  Rash of entire body  Pityriasis rosea  Herald patch    55 y.o. female presents with rash that started as a herald patch of right upper arm and has since spread diffusely over her body in small psoriatic macules. Had recent upper respiratory infection which had resolved. S/s c/w pityriasis rosea, high dose acyclovir has been demonstrated to potentially shorten duration so was prescribed. Topical steroids for itching symptoms, not to be applied to face. Recommended direct sun exposure if able. Plan to follow up with PCP as needed and return precautions discussed for worsening or new concerning symptoms.     Leo Grosser, MD 05/06/15 [phone]

## 2015-05-05 NOTE — Discharge Instructions (Signed)
Pityriasis Rosea  Pityriasis rosea is a rash that usually appears on the trunk of the body. It may also appear on the upper arms and upper legs. It usually begins as a single patch, and then more patches begin to develop. The rash may cause mild itching, but it normally does not cause other problems. It usually goes away without treatment. However, it may take weeks or months for the rash to go away completely.  CAUSES  The cause of this condition is not known. The condition does not spread from person to person (is noncontagious).  RISK FACTORS  This condition is more likely to develop in young adults and children. It is most common in the spring and fall.  SYMPTOMS  The main symptom of this condition is a rash.  · The rash usually begins with a single oval patch that is larger than the ones that follow. This is called a herald patch. It generally appears a week or more before the rest of the rash appears.  · When more patches start to develop, they spread quickly on the trunk, back, and arms. These patches are smaller than the first one.  · The patches that make up the rash are usually oval-shaped and pink or red in color. They are usually flat, but they may sometimes be raised so that they can be felt with a finger. They may also be finely crinkled and have a scaly ring around the edge.  · The rash does not typically appear on areas of the skin that are exposed to the sun.  Most people who have this condition do not have other symptoms, but some have mild itching. In a few cases, a mild headache or body aches may occur before the rash appears and then go away.  DIAGNOSIS  Your health care provider may diagnose this condition by doing a physical exam and taking your medical history. To rule out other possible causes for the rash, the health care provider may order blood tests or take a skin sample from the rash to be looked at under a microscope.  TREATMENT  Usually, treatment is not needed for this condition. The  rash will probably go away on its own in 4-8 weeks. In some cases, a health care provider may recommend or prescribe medicine to reduce itching.  HOME CARE INSTRUCTIONS  · Take medicines only as directed by your health care provider.  · Avoid scratching the affected areas of skin.  · Do not take hot baths or use a sauna. Use only warm water when bathing or showering. Heat can increase itching.  SEEK MEDICAL CARE IF:  · Your rash does not go away in 8 weeks.  · Your rash gets much worse.  · You have a fever.  · You have swelling or pain in the rash area.  · You have fluid, blood, or pus coming from the rash area.     This information is not intended to replace advice given to you by your health care provider. Make sure you discuss any questions you have with your health care provider.     Document Released: 03/05/2001 Document Revised: 06/13/2014 Document Reviewed: 01/04/2014  Elsevier Interactive Patient Education ©2016 Elsevier Inc.

## 2015-05-05 NOTE — ED Notes (Signed)
Patient states that she went to her MD on wed and was told she has ring worm. The patient reports that it is getting worse to other areas to her body

## 2015-05-08 ENCOUNTER — Encounter: Payer: Self-pay | Admitting: Internal Medicine

## 2015-05-08 ENCOUNTER — Ambulatory Visit (INDEPENDENT_AMBULATORY_CARE_PROVIDER_SITE_OTHER): Payer: BLUE CROSS/BLUE SHIELD | Admitting: Internal Medicine

## 2015-05-08 VITALS — BP 112/74 | HR 58 | Temp 98.3°F | Ht 67.0 in | Wt 150.1 lb

## 2015-05-08 DIAGNOSIS — L42 Pityriasis rosea: Secondary | ICD-10-CM | POA: Diagnosis not present

## 2015-05-08 DIAGNOSIS — Z09 Encounter for follow-up examination after completed treatment for conditions other than malignant neoplasm: Secondary | ICD-10-CM | POA: Insufficient documentation

## 2015-05-08 NOTE — Assessment & Plan Note (Signed)
Rash, agree w/ dx of pityriasis rosea . Explained is a benign condition, recommend ---> time and OTC hydrocortisone. She is also taking a high dose of acyclovir, I'm not sure if that is going to help her significantly, okay to discontinue Follow-up  05-2015 as schedule for a  physical

## 2015-05-08 NOTE — Patient Instructions (Signed)
   Pityriasis Rosea Pityriasis rosea is a rash that usually appears on the trunk of the body. It may also appear on the upper arms and upper legs. It usually begins as a single patch, and then more patches begin to develop. The rash may cause mild itching, but it normally does not cause other problems. It usually goes away without treatment. However, it may take weeks or months for the rash to go away completely. CAUSES The cause of this condition is not known. The condition does not spread from person to person (is noncontagious). RISK FACTORS This condition is more likely to develop in young adults and children. It is most common in the spring and fall. SYMPTOMS The main symptom of this condition is a rash.  The rash usually begins with a single oval patch that is larger than the ones that follow. This is called a herald patch. It generally appears a week or more before the rest of the rash appears.  When more patches start to develop, they spread quickly on the trunk, back, and arms. These patches are smaller than the first one.  The patches that make up the rash are usually oval-shaped and pink or red in color. They are usually flat, but they may sometimes be raised so that they can be felt with a finger. They may also be finely crinkled and have a scaly ring around the edge.  The rash does not typically appear on areas of the skin that are exposed to the sun. Most people who have this condition do not have other symptoms, but some have mild itching. In a few cases, a mild headache or body aches may occur before the rash appears and then go away. DIAGNOSIS Your health care provider may diagnose this condition by doing a physical exam and taking your medical history. To rule out other possible causes for the rash, the health care provider may order blood tests or take a skin sample from the rash to be looked at under a microscope. TREATMENT Usually, treatment is not needed for this condition.  The rash will probably go away on its own in 4-8 weeks. In some cases, a health care provider may recommend or prescribe medicine to reduce itching. HOME CARE INSTRUCTIONS  Take medicines only as directed by your health care provider.  Avoid scratching the affected areas of skin.  Do not take hot baths or use a sauna. Use only warm water when bathing or showering. Heat can increase itching. SEEK MEDICAL CARE IF:  Your rash does not go away in 8 weeks.  Your rash gets much worse.  You have a fever.  You have swelling or pain in the rash area.  You have fluid, blood, or pus coming from the rash area.   This information is not intended to replace advice given to you by your health care provider. Make sure you discuss any questions you have with your health care provider.   Document Released: 03/05/2001 Document Revised: 06/13/2014 Document Reviewed: 01/04/2014 Elsevier Interactive Patient Education [employer].

## 2015-05-08 NOTE — Progress Notes (Signed)
Pre visit review using our clinic review tool, if applicable. No additional management support is needed unless otherwise documented below in the visit note. 

## 2015-05-08 NOTE — Progress Notes (Signed)
Subjective:    Patient ID: Erin Fox, female    DOB: Mar 27, 1960, 55 y.o.   MRN: [id number]  DOS:  05/08/2015 Type of visit - description : Acute visit Interval history: Developed a skin lesion according to the patient 2 months ago at the right shoulder, it started spreading approximately a week ago. Went to the ER 05/05/2015, DX pityriasis rosea Denies any itching, pain or blisters Was treated for bronchitis with a Z-Pak 2 weeks ago. Better  Review of Systems No fever chills or arthralgias  Past Medical History  Diagnosis Date  . Hyperlipidemia   . Insomnia   . OAB (overactive bladder)   . HSV (herpes simplex virus) infection   . Anxiety   . Hematuria     w/u neg ~ 01-2014, 03-2014  . Erythematous bladder mucosa   . History of colon polyps   . Diverticulosis of colon   . Nephrolithiasis     NON-OBSTRUCTIVE  . Wears glasses   . Cyst on ear     Left ear    Past Surgical History  Procedure Laterality Date  . 5th toes resected bilateral  1990's    bone's removed  . Cesarean section    . Abdominoplasty  1990  . Breast biopsy Right 01-02-2004  . Removal bartholin cyst/ gland and revision left labia  06-10-2004  . Cysto/  retrograde pyelogram/  left ureteroscopy/  bladder biopsy  08-27-2004  . Colonoscopy w/ polypectomy  last one 02-14-2014  . Abdominal hysterectomy  1984    W/ UNILATERAL SALPINGOOPHORECTOMY  . Cystoscopy with biopsy N/A 03/17/2014    Procedure: CYSTOSCOPY WITH BIOPSY AND FULGERATION;  Surgeon: Alexis Frock, MD;  Location: [facility];  Service: Urology;  Laterality: N/A;  . Cystoscopy w/ retrogrades Bilateral 03/17/2014    Procedure: CYSTOSCOPY WITH RETROGRADE PYELOGRAM;  Surgeon: Alexis Frock, MD;  Location: [facility];  Service: Urology;  Laterality: Bilateral;  . Other surgical history Left 2016    Cyst Removal on Left ear    Social History   Social History  . Marital Status: Divorced    Spouse Name: N/A  .  Number of Children: 1  . Years of Education: N/A   Occupational History  . quality technician    Social History Main Topics  . Smoking status: Current Every Day Smoker -- 0.50 packs/day for 30 years    Types: Cigarettes  . Smokeless tobacco: Never Used  . Alcohol Use: Yes     Comment: OCCASIONAL  . Drug Use: Yes    Special: Marijuana     Comment: "every other weekend" per pt.  . Sexual Activity: Not on file   Other Topics Concern  . Not on file   Social History Narrative   Lives  W/ her sister         Medication List       This list is accurate as of: 05/08/15  5:56 PM.  Always use your most recent med list.               acyclovir 200 MG capsule  Commonly known as:  ZOVIRAX  Take 5 capsules (1,000 mg total) by mouth 5 (five) times daily.     fluticasone 50 MCG/ACT nasal spray  Commonly known as:  FLONASE  Place 2 sprays into both nostrils daily.     simvastatin 20 MG tablet  Commonly known as:  ZOCOR  Take 1 tablet (20 mg total) by mouth at bedtime.  triamcinolone cream 0.1 %  Commonly known as:  KENALOG  Apply 1 application topically 2 (two) times daily.           Objective:   Physical Exam BP 112/74 mmHg  Pulse 58  Temp(Src) 98.3 F (36.8 C) (Oral)  Ht 5\' 7"  (1.702 m)  Wt 150 lb 2 oz (68.096 kg)  BMI 23.51 kg/m2  SpO2 97% General:   Well developed, well nourished . NAD.  HEENT:  Normocephalic . Face symmetric, atraumatic  Skin: Not pale. Not jaundice. 2 cm, oval, pink rash at the right shoulder. Has multiple  r skin lesions at the back=chest=abd>arms>legs: less than 1 cm, pink, slt scaly, many in a Xmas tree distribution  Neurologic:  alert & oriented X3.  Speech normal, gait appropriate for age and unassisted Psych--  Cognition and judgment appear intact.  Cooperative with normal attention span and concentration.  Behavior appropriate. No anxious or depressed appearing.      Assessment & Plan:   Assessment Anxiety,  insomnia Hyperlipidemia HSV L leg edema, chronic (venous insuff) Over active bladder, s/p cysto 03-2014 Non-obstructive nephrolithiasis  Plan: Rash, agree w/ dx of pityriasis rosea . Explained is a benign condition, recommend ---> time and OTC hydrocortisone. She is also taking a high dose of acyclovir, I'm not sure if that is going to help her significantly, okay to discontinue Follow-up  05-2015 as schedule for a  physical

## 2015-05-15 ENCOUNTER — Telehealth: Payer: Self-pay

## 2015-05-15 NOTE — Telephone Encounter (Signed)
Pre visit call made to patient. Left message for return call to update chart.

## 2015-05-16 ENCOUNTER — Encounter: Payer: Self-pay | Admitting: Internal Medicine

## 2015-05-16 ENCOUNTER — Ambulatory Visit (INDEPENDENT_AMBULATORY_CARE_PROVIDER_SITE_OTHER): Payer: BLUE CROSS/BLUE SHIELD | Admitting: Internal Medicine

## 2015-05-16 VITALS — BP 122/80 | HR 56 | Temp 97.8°F | Ht 67.0 in | Wt 151.1 lb

## 2015-05-16 DIAGNOSIS — Z0000 Encounter for general adult medical examination without abnormal findings: Secondary | ICD-10-CM

## 2015-05-16 DIAGNOSIS — Z09 Encounter for follow-up examination after completed treatment for conditions other than malignant neoplasm: Secondary | ICD-10-CM

## 2015-05-16 LAB — LIPID PANEL
CHOL/HDL RATIO: 2
Cholesterol: 193 mg/dL (ref 0–200)
HDL: 79 mg/dL (ref >39.00)
LDL Cholesterol: 102 mg/dL — ABNORMAL HIGH (ref 0–99)
NonHDL: 114.33
Triglycerides: 63 mg/dL (ref 0.0–149.0)
VLDL: 12.6 mg/dL (ref 0.0–40.0)

## 2015-05-16 LAB — BASIC METABOLIC PANEL
BUN: 13 mg/dL (ref 6–23)
CHLORIDE: 106 meq/L (ref 96–112)
CO2: 31 mEq/L (ref 19–32)
Calcium: 9.4 mg/dL (ref 8.4–10.5)
Creatinine, Ser: 0.69 mg/dL (ref 0.40–1.20)
GFR: 114.36 mL/min (ref >60.00)
GLUCOSE: 96 mg/dL (ref 70–99)
Potassium: 3.5 mEq/L (ref 3.5–5.1)
Sodium: 142 mEq/L (ref 135–145)

## 2015-05-16 LAB — TSH: TSH: 5.25 u[IU]/mL — ABNORMAL HIGH (ref 0.35–4.50)

## 2015-05-16 LAB — AST: AST: 18 U/L (ref 0–37)

## 2015-05-16 LAB — ALT: ALT: 15 U/L (ref 0–35)

## 2015-05-16 MED ORDER — FLUTICASONE PROPIONATE 50 MCG/ACT NA SUSP: 2.0000 | Freq: Every day | NASAL | Status: DC

## 2015-05-16 NOTE — Patient Instructions (Signed)
GO TO THE LAB :      Get the blood work     GO TO THE FRONT DESK Schedule your next appointment for a  Physical in 1 year, fasting  Calcium  0.5 to 1 gr a day Vit D 600 to 1200 units a day       Steps to Quit Smoking  Smoking tobacco can be harmful to your health and can affect almost every organ in your body. Smoking puts you, and those around you, at risk for developing many serious chronic diseases. Quitting smoking is difficult, but it is one of the best things that you can do for your health. It is never too late to quit. WHAT ARE THE BENEFITS OF QUITTING SMOKING? When you quit smoking, you lower your risk of developing serious diseases and conditions, such as:  Lung cancer or lung disease, such as COPD.  Heart disease.  Stroke.  Heart attack.  Infertility.  Osteoporosis and bone fractures. Additionally, symptoms such as coughing, wheezing, and shortness of breath may get better when you quit. You may also find that you get sick less often because your body is stronger at fighting off colds and infections. If you are pregnant, quitting smoking can help to reduce your chances of having a baby of low birth weight. HOW DO I GET READY TO QUIT? When you decide to quit smoking, create a plan to make sure that you are successful. Before you quit:  Pick a date to quit. Set a date within the next two weeks to give you time to prepare.  Write down the reasons why you are quitting. Keep this list in places where you will see it often, such as on your bathroom mirror or in your car or wallet.  Identify the people, places, things, and activities that make you want to smoke (triggers) and avoid them. Make sure to take these actions:  Throw away all cigarettes at home, at work, and in your car.  Throw away smoking accessories, such as Scientist, research (medical).  Clean your car and make sure to empty the ashtray.  Clean your home, including curtains and carpets.  Tell your family,  friends, and coworkers that you are quitting. Support from your loved ones can make quitting easier.  Talk with your health care provider about your options for quitting smoking.  Find out what treatment options are covered by your health insurance. WHAT STRATEGIES CAN I USE TO QUIT SMOKING?  Talk with your healthcare provider about different strategies to quit smoking. Some strategies include:  Quitting smoking altogether instead of gradually lessening how much you smoke over a period of time. Research shows that quitting "cold [place]" is more successful than gradually quitting.  Attending in-person counseling to help you build problem-solving skills. You are more likely to have success in quitting if you attend several counseling sessions. Even short sessions of 10 minutes can be effective.  Finding resources and support systems that can help you to quit smoking and remain smoke-free after you quit. These resources are most helpful when you use them often. They can include:  Online chats with a Social worker.  Telephone quitlines.  Printed Furniture conservator/restorer.  Support groups or group counseling.  Text messaging programs.  Mobile phone applications.  Taking medicines to help you quit smoking. (If you are pregnant or breastfeeding, talk with your health care provider first.) Some medicines contain nicotine and some do not. Both types of medicines help with cravings, but the medicines that  include nicotine help to relieve withdrawal symptoms. Your health care provider may recommend:  Nicotine patches, gum, or lozenges.  Nicotine inhalers or sprays.  Non-nicotine medicine that is taken by mouth. Talk with your health care provider about combining strategies, such as taking medicines while you are also receiving in-person counseling. Using these two strategies together makes you more likely to succeed in quitting than if you used either strategy on its own. If you are pregnant or  breastfeeding, talk with your health care provider about finding counseling or other support strategies to quit smoking. Do not take medicine to help you quit smoking unless told to do so by your health care provider. WHAT THINGS CAN I DO TO MAKE IT EASIER TO QUIT? Quitting smoking might feel overwhelming at first, but there is a lot that you can do to make it easier. Take these important actions:  Reach out to your family and friends and ask that they support and encourage you during this time. Call telephone quitlines, reach out to support groups, or work with a counselor for support.  Ask people who smoke to avoid smoking around you.  Avoid places that trigger you to smoke, such as bars, parties, or smoke-break areas at work.  Spend time around people who do not smoke.  Lessen stress in your life, because stress can be a smoking trigger for some people. To lessen stress, try:  Exercising regularly.  Deep-breathing exercises.  Yoga.  Meditating.  Performing a body scan. This involves closing your eyes, scanning your body from head to toe, and noticing which parts of your body are particularly tense. Purposefully relax the muscles in those areas.  Download or purchase mobile phone or tablet apps (applications) that can help you stick to your quit plan by providing reminders, tips, and encouragement. There are many free apps, such as QuitGuide from the [employer] Office manager for Disease Control and Prevention). You can find other support for quitting smoking (smoking cessation) through smokefree.gov and other websites. HOW WILL I FEEL WHEN I QUIT SMOKING? Within the first 24 hours of quitting smoking, you may start to feel some withdrawal symptoms. These symptoms are usually most noticeable 2-3 days after quitting, but they usually do not last beyond 2-3 weeks. Changes or symptoms that you might experience include:  Mood swings.  Restlessness, anxiety, or irritation.  Difficulty  concentrating.  Dizziness.  Strong cravings for sugary foods in addition to nicotine.  Mild weight gain.  Constipation.  Nausea.  Coughing or a sore throat.  Changes in how your medicines work in your body.  A depressed mood.  Difficulty sleeping (insomnia). After the first 2-3 weeks of quitting, you may start to notice more positive results, such as:  Improved sense of smell and taste.  Decreased coughing and sore throat.  Slower heart rate.  Lower blood pressure.  Clearer skin.  The ability to breathe more easily.  Fewer sick days. Quitting smoking is very challenging for most people. Do not get discouraged if you are not successful the first time. Some people need to make many attempts to quit before they achieve long-term success. Do your best to stick to your quit plan, and talk with your health care provider if you have any questions or concerns.   This information is not intended to replace advice given to you by your health care provider. Make sure you discuss any questions you have with your health care provider.   Document Released: 01/21/2001 Document Revised: 06/13/2014 Document Reviewed: 06/13/2014  Chartered certified accountant Patient Education [employer].

## 2015-05-16 NOTE — Progress Notes (Signed)
Subjective:    Patient ID: Erin Fox, female    DOB: [birth year], 55 y.o.   MRN: [id number]  DOS:  05/16/2015 Type of visit - description : CPX Interval history: No major concerns    Review of Systems  Constitutional: No fever. No chills. No unexplained wt changes. No unusual sweats  HEENT: No dental problems, no ear discharge, no facial swelling, no voice changes. No eye discharge, no eye  redness , no  intolerance to light   Respiratory: No wheezing , no  difficulty breathing. No cough , no mucus production  Cardiovascular: No CP, no leg swelling , no  Palpitations  GI: no nausea, no vomiting, no diarrhea , no  abdominal pain.  No blood in the stools. No dysphagia, no odynophagia    Endocrine: No polyphagia, no polyuria , no polydipsia  GU: No dysuria, gross hematuria, difficulty urinating. No urinary urgency, no frequency.  Musculoskeletal: No joint swellings or unusual aches or pains  Skin: No change in the color of the skin, palor. P. Rosacea rash is getting better  Allergic, immunologic: No environmental allergies , no  food allergies  Neurological: No dizziness no  syncope. No headaches. No diplopia, no slurred, no slurred speech, no motor deficits, no facial  Numbness  Hematological: No enlarged lymph nodes, no easy bruising , no unusual bleedings  Psychiatry: No suicidal ideas, no hallucinations, no beavior problems, no confusion.  No unusual/severe anxiety, no depression  Past Medical History  Diagnosis Date  . Hyperlipidemia   . Insomnia   . OAB (overactive bladder)   . HSV (herpes simplex virus) infection   . Anxiety   . Hematuria     w/u neg ~ 01-2014, 03-2014  . Erythematous bladder mucosa   . History of colon polyps   . Diverticulosis of colon   . Nephrolithiasis     NON-OBSTRUCTIVE  . Wears glasses   . Cyst on ear     Left ear    Past Surgical History  Procedure Laterality Date  . 5th toes resected bilateral  1990's    bone's removed    . Cesarean section    . Abdominoplasty  1990  . Breast biopsy Right 01-02-2004  . Removal bartholin cyst/ gland and revision left labia  06-10-2004  . Cysto/  retrograde pyelogram/  left ureteroscopy/  bladder biopsy  08-27-2004  . Colonoscopy w/ polypectomy  last one 02-14-2014  . Abdominal hysterectomy  1984    W/ UNILATERAL SALPINGOOPHORECTOMY  . Cystoscopy with biopsy N/A 03/17/2014    Procedure: CYSTOSCOPY WITH BIOPSY AND FULGERATION;  Surgeon: Alexis Frock, MD;  Location: [facility];  Service: Urology;  Laterality: N/A;  . Cystoscopy w/ retrogrades Bilateral 03/17/2014    Procedure: CYSTOSCOPY WITH RETROGRADE PYELOGRAM;  Surgeon: Alexis Frock, MD;  Location: [facility];  Service: Urology;  Laterality: Bilateral;  . Other surgical history Left 2016    Cyst Removal on Left ear    Social History   Social History  . Marital Status: Divorced    Spouse Name: N/A  . Number of Children: 1  . Years of Education: N/A   Occupational History  . quality technician    Social History Main Topics  . Smoking status: Current Every Day Smoker -- 0.50 packs/day for 30 years    Types: Cigarettes  . Smokeless tobacco: Never Used     Comment: 1/3 ppd  . Alcohol Use: 0.0 oz/week    0 Standard drinks or equivalent  per week     Comment: OCCASIONAL  . Drug Use: Yes    Special: Marijuana     Comment: "every other weekend" per pt.  . Sexual Activity: Not on file   Other Topics Concern  . Not on file   Social History Narrative   Lives by herself      Family History  Problem Relation Age of Onset  . Hyperlipidemia Mother   . Hypertension Mother     M and sister   . Diabetes Father   . Cancer Paternal Aunt     lung  . Colon cancer Other     uncle , dx at age 28s  . Breast cancer Neg Hx   . Colon polyps Sister 32    8 polyps removed  . High blood pressure Sister   . Colon cancer Paternal Uncle        Medication List       This list is  accurate as of: 05/16/15 12:05 PM.  Always use your most recent med list.               acyclovir 200 MG capsule  Commonly known as:  ZOVIRAX  Take 5 capsules (1,000 mg total) by mouth 5 (five) times daily.     fluticasone 50 MCG/ACT nasal spray  Commonly known as:  FLONASE  Place 2 sprays into both nostrils daily.     simvastatin 20 MG tablet  Commonly known as:  ZOCOR  Take 1 tablet (20 mg total) by mouth at bedtime.           Objective:   Physical Exam BP 122/80 mmHg  Pulse 56  Temp(Src) 97.8 F (36.6 C) (Oral)  Ht 5\' 7"  (1.702 m)  Wt 151 lb 2 oz (68.55 kg)  BMI 23.66 kg/m2  SpO2 98%  General:   Well developed, well nourished . NAD.  Neck: No  thyromegaly  HEENT:  Normocephalic . Face symmetric, atraumatic Lungs:  CTA B Normal respiratory effort, no intercostal retractions, no accessory muscle use. Heart: RRR,  no murmur.  Abdomen:  Not distended, soft, non-tender. No rebound or rigidity.   Skin: Exposed areas without rash. Not pale. Not jaundice Neurologic:  alert & oriented X3.  Speech normal, gait appropriate for age and unassisted Strength symmetric and appropriate for age.  Psych: Cognition and judgment appear intact.  Cooperative with normal attention span and concentration.  Behavior appropriate. No anxious or depressed appearing.    Assessment & Plan:   Assessment Anxiety, insomnia Hyperlipidemia HSV L leg edema, chronic (venous insuff) Over active bladder, s/p cysto 03-2014 Non-obstructive nephrolithiasis  Plan: P. rosacea: Improving gradually. High cholesterol: Labs, refill medications RTC one year

## 2015-05-16 NOTE — Assessment & Plan Note (Signed)
Td 2016  zostavax discussed before   PAP done 2012, never had an abnormal one; h/o hysterectomy for benign reasons, not sexually active. Per guidelines no need for further PAPs, she is asx    Mammogram 03-2015: negative Breast exam-- declined today  Colonoscopy November 2010: Polyps, diverticula, hemorrhoids. cscope 1-16, no polyps, next 7 years per report Dr Deatra Ina.   discussed quitting tobacco, diet and exercise Labs

## 2015-05-16 NOTE — Assessment & Plan Note (Signed)
P. rosacea: Improving gradually. High cholesterol: Labs, refill medications RTC one year

## 2015-05-16 NOTE — Progress Notes (Signed)
Pre visit review using our clinic review tool, if applicable. No additional management support is needed unless otherwise documented below in the visit note. 

## 2015-05-20 LAB — VITAMIN D 1,25 DIHYDROXY
VITAMIN D3 1, 25 (OH): 33 pg/mL
Vitamin D 1, 25 (OH)2 Total: 61 pg/mL (ref 18–72)
Vitamin D2 1, 25 (OH)2: 28 pg/mL

## 2015-05-21 ENCOUNTER — Telehealth: Payer: Self-pay | Admitting: Internal Medicine

## 2015-05-21 MED ORDER — SIMVASTATIN 20 MG PO TABS: 20.0000 mg | ORAL_TABLET | Freq: Every day | ORAL | Status: DC

## 2015-05-21 NOTE — Telephone Encounter (Signed)
Caller name: Self  Can be reached: [phone]  Pharmacy:  [facility] [place], [place]. [phone] (Phone) [phone] (Fax)         Reason for call: simvastatin (ZOCOR) 20 MG tablet [id number] and Acyclovir needs to be updated on her chart so that she gets 500 mgs tablets the next time instead of 200mg 

## 2015-05-21 NOTE — Telephone Encounter (Signed)
Med history reviewed, Pt previously on Acyclovir 400 mg 1 tablet BID PRN. Pt was seen at ED on 05/05/2015, MD lowered dosage to 200 mg 5 capsules 5 times daily. Med list updated back to correct dosage. Simvastatin Rx sent to [facility].

## 2015-08-26 ENCOUNTER — Telehealth: Payer: Self-pay | Admitting: Internal Medicine

## 2015-08-26 DIAGNOSIS — R7989 Other specified abnormal findings of blood chemistry: Secondary | ICD-10-CM

## 2015-08-26 NOTE — Telephone Encounter (Signed)
Please call the patient and set up labs: TSH, free T3, free T4 ---- DX abnormal TSH

## 2015-08-27 NOTE — Telephone Encounter (Signed)
LMOM informing Pt to call and schedule lab appt for TSH check.

## 2015-08-31 ENCOUNTER — Other Ambulatory Visit (INDEPENDENT_AMBULATORY_CARE_PROVIDER_SITE_OTHER): Payer: BLUE CROSS/BLUE SHIELD

## 2015-08-31 DIAGNOSIS — R7989 Other specified abnormal findings of blood chemistry: Secondary | ICD-10-CM | POA: Diagnosis not present

## 2015-08-31 LAB — TSH: TSH: 3.27 u[IU]/mL (ref 0.35–4.50)

## 2015-08-31 LAB — T3, FREE: T3, Free: 2.5 pg/mL (ref 2.3–4.2)

## 2015-08-31 LAB — T4, FREE: Free T4: 0.82 ng/dL (ref 0.60–1.60)

## 2016-02-19 ENCOUNTER — Other Ambulatory Visit: Payer: Self-pay | Admitting: Internal Medicine

## 2016-02-19 DIAGNOSIS — Z1231 Encounter for screening mammogram for malignant neoplasm of breast: Secondary | ICD-10-CM

## 2016-02-26 DIAGNOSIS — R600 Localized edema: Secondary | ICD-10-CM | POA: Diagnosis not present

## 2016-02-26 DIAGNOSIS — I8312 Varicose veins of left lower extremity with inflammation: Secondary | ICD-10-CM | POA: Diagnosis not present

## 2016-02-29 ENCOUNTER — Telehealth: Payer: Self-pay | Admitting: Behavioral Health

## 2016-02-29 ENCOUNTER — Ambulatory Visit (INDEPENDENT_AMBULATORY_CARE_PROVIDER_SITE_OTHER): Payer: BLUE CROSS/BLUE SHIELD | Admitting: Family Medicine

## 2016-02-29 ENCOUNTER — Encounter: Payer: Self-pay | Admitting: Family Medicine

## 2016-02-29 VITALS — BP 122/82 | HR 56 | Temp 98.2°F | Resp 16 | Ht 67.0 in | Wt 151.4 lb

## 2016-02-29 DIAGNOSIS — R030 Elevated blood-pressure reading, without diagnosis of hypertension: Secondary | ICD-10-CM | POA: Diagnosis not present

## 2016-02-29 NOTE — Progress Notes (Signed)
Pre visit review using our clinic review tool, if applicable. No additional management support is needed unless otherwise documented below in the visit note. 

## 2016-02-29 NOTE — Progress Notes (Signed)
Patient ID: Erin Fox, female    DOB: 1960-09-16  Age: 56 y.o. MRN: [id number]    Subjective:  Subjective  HPI Erin Fox presents for f/u bp.  She was told by vein specialist she had high bp and in pharmacy it was high as well.  Pt with no cp, sob, palpitations or diaphoresis.     Review of Systems  Constitutional: Negative for activity change, appetite change, fatigue and unexpected weight change.  Respiratory: Negative for cough and shortness of breath.   Cardiovascular: Negative for chest pain and palpitations.  Psychiatric/Behavioral: Negative for behavioral problems and dysphoric mood. The patient is not nervous/anxious.     History Past Medical History:  Diagnosis Date  . Anxiety   . Cyst on ear    Left ear  . Diverticulosis of colon   . Erythematous bladder mucosa   . Hematuria    w/u neg ~ 01-2014, 03-2014  . History of colon polyps   . HSV (herpes simplex virus) infection   . Hyperlipidemia   . Insomnia   . Nephrolithiasis    NON-OBSTRUCTIVE  . OAB (overactive bladder)   . Wears glasses     She has a past surgical history that includes 5th toes resected bilateral (1990's); Cesarean section; Abdominoplasty (1990); Breast biopsy (Right, 01-02-2004); REMOVAL BARTHOLIN CYST/ GLAND AND REVISION LEFT LABIA (06-10-2004); CYSTO/  RETROGRADE PYELOGRAM/  LEFT URETEROSCOPY/  BLADDER BIOPSY (08-27-2004); Colonoscopy w/ polypectomy (last one 02-14-2014); Abdominal hysterectomy (1984); Cystoscopy with biopsy (N/A, 03/17/2014); Cystoscopy w/ retrogrades (Bilateral, 03/17/2014); and Other surgical history (Left, 2016).   Her family history includes Cancer in her paternal aunt; Colon cancer in her other and paternal uncle; Colon polyps (age of onset: 47) in her sister; Diabetes in her father; High blood pressure in her sister; Hyperlipidemia in her mother; Hypertension in her mother.She reports that she has been smoking Cigarettes.  She has a 15.00 pack-year smoking history. She  has never used smokeless tobacco. She reports that she drinks alcohol. She reports that she uses drugs, including Marijuana.  Current Outpatient Prescriptions on File Prior to Visit  Medication Sig Dispense Refill  . acyclovir (ZOVIRAX) 400 MG tablet Take 400 mg by mouth 2 (two) times daily as needed.    . fluticasone (FLONASE) 50 MCG/ACT nasal spray Place 2 sprays into both nostrils daily. 16 g 12  . simvastatin (ZOCOR) 20 MG tablet Take 1 tablet (20 mg total) by mouth at bedtime. 90 tablet 3   No current facility-administered medications on file prior to visit.      Objective:  Objective  Physical Exam  Constitutional: She is oriented to person, place, and time. She appears well-developed and well-nourished.  HENT:  Head: Normocephalic and atraumatic.  Eyes: Conjunctivae and EOM are normal.  Neck: Normal range of motion. Neck supple. No JVD present. Carotid bruit is not present. No thyromegaly present.  Cardiovascular: Normal rate, regular rhythm and normal heart sounds.   No murmur heard. Pulmonary/Chest: Effort normal and breath sounds normal. No respiratory distress. She has no wheezes. She has no rales. She exhibits no tenderness.  Musculoskeletal: She exhibits no edema.  Neurological: She is alert and oriented to person, place, and time.  Psychiatric: She has a normal mood and affect. Her behavior is normal. Judgment and thought content normal.  Nursing note and vitals reviewed.  BP 122/82 (BP Location: Left Arm, Patient Position: Sitting, Cuff Size: Normal)   Pulse (!) 56   Temp 98.2 F (36.8 C) (Oral)  Resp 16   Ht 5\' 7"  (1.702 m)   Wt 151 lb 6.4 oz (68.7 kg)   SpO2 98%   BMI 23.71 kg/m  Wt Readings from Last 3 Encounters:  02/29/16 151 lb 6.4 oz (68.7 kg)  05/16/15 151 lb 2 oz (68.5 kg)  05/08/15 150 lb 2 oz (68.1 kg)     Lab Results  Component Value Date   WBC 4.0 04/11/2014   HGB 12.6 04/11/2014   HCT 37.1 04/11/2014   PLT 212.0 04/11/2014   GLUCOSE 96  05/16/2015   CHOL 193 05/16/2015   TRIG 63.0 05/16/2015   HDL 79.00 05/16/2015   LDLDIRECT 121.0 11/05/2010   LDLCALC 102 (H) 05/16/2015   ALT 15 05/16/2015   AST 18 05/16/2015   NA 142 05/16/2015   K 3.5 05/16/2015   CL 106 05/16/2015   CREATININE 0.69 05/16/2015   BUN 13 05/16/2015   CO2 31 05/16/2015   TSH 3.27 08/31/2015    No results found.   Assessment & Plan:  Plan  I am having Erin Fox maintain her fluticasone, acyclovir, and simvastatin.  No orders of the defined types were placed in this encounter.   Problem List Items Addressed This Visit    None    Visit Diagnoses    Elevated BP without diagnosis of hypertension    -  Primary                Follow dash diet           rto in 2-3 weeks for bp check  Follow-up: Return in about 3 weeks (around 03/21/2016) for hypertension.  Ann Held, DO

## 2016-02-29 NOTE — Telephone Encounter (Signed)
Patient presented as walk-in to the clinic today. RN brought patient back to the treatment room for further evaluation. She reported that her blood pressure has been elevated daily since this past Tuesday. Patient voiced that she was first made aware of this medical concern when seen with her Vein specialist; the reading on that day was BP 148/99, 02/28/16 BP 148/99 P 79 ([facility]) & 02/29/16 BP 147/99 ([facility]). Currently, at this time the patient denies chest pain, pounding/increased heart rate, dizziness/light headedness, headaches, numbness or tingling in limbs. Vitals were as follow: BP 143/83 P 61 02 100% & BP 135/89 P 60 O2 99%. The patient was scheduled to be seen with Dr. Carollee Herter today at 11:30 AM. Verbal report given to [facility] B., LPN. Message routed to Dr. Carollee Herter for review.

## 2016-02-29 NOTE — Telephone Encounter (Signed)
Pt seen today-- to f/u in 2-3 weeks to recheck bp and will get bp cuff

## 2016-02-29 NOTE — Patient Instructions (Signed)
DASH Eating Plan DASH stands for "Dietary Approaches to Stop Hypertension." The DASH eating plan is a healthy eating plan that has been shown to reduce high blood pressure (hypertension). Additional health benefits may include reducing the risk of type 2 diabetes mellitus, heart disease, and stroke. The DASH eating plan may also help with weight loss. What do I need to know about the DASH eating plan? For the DASH eating plan, you will follow these general guidelines:  Choose foods with less than 150 milligrams of sodium per serving (as listed on the food label).  Use salt-free seasonings or herbs instead of table salt or sea salt.  Check with your health care provider or pharmacist before using salt substitutes.  Eat lower-sodium products. These are often labeled as "low-sodium" or "no salt added."  Eat fresh foods. Avoid eating a lot of canned foods.  Eat more vegetables, fruits, and low-fat dairy products.  Choose whole grains. Look for the word "whole" as the first word in the ingredient list.  Choose fish and skinless chicken or turkey more often than red meat. Limit fish, poultry, and meat to 6 oz (170 g) each day.  Limit sweets, desserts, sugars, and sugary drinks.  Choose heart-healthy fats.  Eat more home-cooked food and less restaurant, buffet, and fast food.  Limit fried foods.  Do not fry foods. Cook foods using methods such as baking, boiling, grilling, and broiling instead.  When eating at a restaurant, ask that your food be prepared with less salt, or no salt if possible. What foods can I eat? Seek help from a dietitian for individual calorie needs. Grains  Whole grain or whole wheat bread. Brown rice. Whole grain or whole wheat pasta. Quinoa, bulgur, and whole grain cereals. Low-sodium cereals. Corn or whole wheat flour tortillas. Whole grain cornbread. Whole grain crackers. Low-sodium crackers. Vegetables  Fresh or frozen vegetables (raw, steamed, roasted, or  grilled). Low-sodium or reduced-sodium tomato and vegetable juices. Low-sodium or reduced-sodium tomato sauce and paste. Low-sodium or reduced-sodium canned vegetables. Fruits  All fresh, canned (in natural juice), or frozen fruits. Meat and Other Protein Products  Ground beef (85% or leaner), grass-fed beef, or beef trimmed of fat. Skinless chicken or turkey. Ground chicken or turkey. Pork trimmed of fat. All fish and seafood. Eggs. Dried beans, peas, or lentils. Unsalted nuts and seeds. Unsalted canned beans. Dairy  Low-fat dairy products, such as skim or 1% milk, 2% or reduced-fat cheeses, low-fat ricotta or cottage cheese, or plain low-fat yogurt. Low-sodium or reduced-sodium cheeses. Fats and Oils  Tub margarines without trans fats. Light or reduced-fat mayonnaise and salad dressings (reduced sodium). Avocado. Safflower, olive, or canola oils. Natural peanut or almond butter. Other  Unsalted popcorn and pretzels. The items listed above may not be a complete list of recommended foods or beverages. Contact your dietitian for more options.  What foods are not recommended? Grains  White bread. White pasta. White rice. Refined cornbread. Bagels and croissants. Crackers that contain trans fat. Vegetables  Creamed or fried vegetables. Vegetables in a cheese sauce. Regular canned vegetables. Regular canned tomato sauce and paste. Regular tomato and vegetable juices. Fruits  Canned fruit in light or heavy syrup. Fruit juice. Meat and Other Protein Products  Fatty cuts of meat. Ribs, chicken wings, bacon, sausage, bologna, salami, chitterlings, fatback, hot dogs, bratwurst, and packaged luncheon meats. Salted nuts and seeds. Canned beans with salt. Dairy  Whole or 2% milk, cream, half-and-half, and cream cheese. Whole-fat or sweetened yogurt. Full-fat cheeses   or blue cheese. Nondairy creamers and whipped toppings. Processed cheese, cheese spreads, or cheese curds. Condiments  Onion and garlic  salt, seasoned salt, table salt, and sea salt. Canned and packaged gravies. Worcestershire sauce. Tartar sauce. Barbecue sauce. Teriyaki sauce. Soy sauce, including reduced sodium. Steak sauce. Fish sauce. Oyster sauce. Cocktail sauce. Horseradish. Ketchup and mustard. Meat flavorings and tenderizers. Bouillon cubes. Hot sauce. Tabasco sauce. Marinades. Taco seasonings. Relishes. Fats and Oils  Butter, stick margarine, lard, shortening, ghee, and bacon fat. Coconut, palm kernel, or palm oils. Regular salad dressings. Other  Pickles and olives. Salted popcorn and pretzels. The items listed above may not be a complete list of foods and beverages to avoid. Contact your dietitian for more information.  Where can I find more information? National Heart, Lung, and Blood Institute: www.nhlbi.nih.gov/health/health-topics/topics/dash/ This information is not intended to replace advice given to you by your health care provider. Make sure you discuss any questions you have with your health care provider. Document Released: 01/16/2011 Document Revised: 07/05/2015 Document Reviewed: 12/01/2012 Elsevier Interactive Patient Education  2017 Elsevier Inc.  

## 2016-03-03 DIAGNOSIS — I8312 Varicose veins of left lower extremity with inflammation: Secondary | ICD-10-CM | POA: Diagnosis not present

## 2016-03-18 DIAGNOSIS — I8312 Varicose veins of left lower extremity with inflammation: Secondary | ICD-10-CM | POA: Diagnosis not present

## 2016-03-18 DIAGNOSIS — R600 Localized edema: Secondary | ICD-10-CM | POA: Diagnosis not present

## 2016-03-19 ENCOUNTER — Ambulatory Visit (INDEPENDENT_AMBULATORY_CARE_PROVIDER_SITE_OTHER): Payer: BLUE CROSS/BLUE SHIELD | Admitting: Internal Medicine

## 2016-03-19 ENCOUNTER — Encounter: Payer: Self-pay | Admitting: Internal Medicine

## 2016-03-19 VITALS — BP 126/74 | HR 59 | Temp 98.1°F | Resp 12 | Ht 67.0 in | Wt 150.4 lb

## 2016-03-19 DIAGNOSIS — R030 Elevated blood-pressure reading, without diagnosis of hypertension: Secondary | ICD-10-CM

## 2016-03-19 NOTE — Patient Instructions (Signed)
See you in April    Check the  blood pressure   weekly   Be sure your blood pressure is between 110/65 and  140/85. If it is consistently higher or lower, let me know

## 2016-03-19 NOTE — Progress Notes (Signed)
Subjective:    Patient ID: Erin Fox, female    DOB: 02-02-1961, 56 y.o.   MRN: [id number]  DOS:  03/19/2016 Type of visit - description : Follow-up Interval history:  Patient was told by the vein specialist and the pharmacist that her blood pressure was elevated. (140/100, 130/90s) She had no symptoms She saw Dr. Etter Sjogren 02/29/2016, at the time BP was 122/82 with a pulse of 56. She was not taking any BP meds.   Review of Systems Since the last time she was at this office she is feeling great.  On looking back, she probably ate more salt prior to her blood pressure being elevated but denies the use of NSAIDs. Again denies chest pain, difficulty breathing or unusual edema. No headaches.   Past Medical History:  Diagnosis Date  . Anxiety   . Cyst on ear    Left ear  . Diverticulosis of colon   . Erythematous bladder mucosa   . Hematuria    w/u neg ~ 01-2014, 03-2014  . History of colon polyps   . HSV (herpes simplex virus) infection   . Hyperlipidemia   . Insomnia   . Nephrolithiasis    NON-OBSTRUCTIVE  . OAB (overactive bladder)   . Wears glasses     Past Surgical History:  Procedure Laterality Date  . 5th toes resected bilateral  1990's   bone's removed  . ABDOMINAL HYSTERECTOMY  1984   W/ UNILATERAL SALPINGOOPHORECTOMY  . ABDOMINOPLASTY  1990  . BREAST BIOPSY Right 01-02-2004  . CESAREAN SECTION    . COLONOSCOPY W/ POLYPECTOMY  last one 02-14-2014  . CYSTO/  RETROGRADE PYELOGRAM/  LEFT URETEROSCOPY/  BLADDER BIOPSY  08-27-2004  . CYSTOSCOPY W/ RETROGRADES Bilateral 03/17/2014   Procedure: CYSTOSCOPY WITH RETROGRADE PYELOGRAM;  Surgeon: Alexis Frock, MD;  [facility];  Service: Urology;  Laterality: Bilateral;  . CYSTOSCOPY WITH BIOPSY N/A 03/17/2014   Procedure: CYSTOSCOPY WITH BIOPSY AND FULGERATION;  Surgeon: Alexis Frock, MD;  Location: [facility];  Service: Urology;  Laterality: N/A;  . OTHER SURGICAL HISTORY Left  2016   Cyst Removal on Left ear  . REMOVAL BARTHOLIN CYST/ GLAND AND REVISION LEFT LABIA  06-10-2004    Social History   Social History  . Marital status: Divorced    Spouse name: N/A  . Number of children: 1  . 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 Smoking status: Current Every Day Smoker    Packs/day: 0.50    Years: 30.00    Types: Cigarettes  . Smokeless tobacco: Never Used     Comment: 1/3 ppd  . Alcohol use 0.0 oz/week     Comment: OCCASIONAL  . Drug use: Yes    Types: Marijuana     Comment: "every other weekend" per pt.  . Sexual activity: Not on file   Other Topics Concern  . Not on file   Social History Narrative   Lives by herself       Allergies as of 03/19/2016      Reactions   Latex Rash      Medication List       Accurate as of 03/19/16 11:59 PM. Always use your most recent med list.          acyclovir 400 MG tablet Commonly known as:  ZOVIRAX Take 400 mg by mouth 2 (two) times daily as needed.  fluticasone 50 MCG/ACT nasal spray Commonly known as:  FLONASE Place 2 sprays into both nostrils daily.   simvastatin 20 MG tablet Commonly known as:  ZOCOR Take 1 tablet (20 mg total) by mouth at bedtime.          Objective:   Physical Exam BP 126/74 (BP Location: Left Arm, Patient Position: Sitting, Cuff Size: Normal)   Pulse (!) 59   Temp 98.1 F (36.7 C) (Oral)   Resp 12   Ht 5\' 7"  (1.702 m)   Wt 150 lb 6 oz (68.2 kg)   SpO2 98%   BMI 23.55 kg/m  General:   Well developed, well nourished . NAD.  HEENT:  Normocephalic . Face symmetric, atraumatic Lungs:  CTA B Normal respiratory effort, no intercostal retractions, no accessory muscle use. Heart: RRR,  no murmur.  Left leg slightly larger than right, chronic issue, no actual pitting edema. Skin: Not pale. Not jaundice Neurologic:  alert & oriented X3.  Speech normal, gait appropriate for age  and unassisted Psych--  Cognition and judgment appear intact.  Cooperative with normal attention span and concentration.  Behavior appropriate. No anxious or depressed appearing.      Assessment & Plan:    Assessment Anxiety, insomnia Hyperlipidemia HSV L leg edema, chronic (venous insuff) Over active bladder, s/p cysto 03-2014 Non-obstructive nephrolithiasis  Plan: Elevated BP: BP was elevated twice, since then is normal, has no symptoms. When she has checked her BPs before they were normal as well. Rec to continue monitor BPs at home, a healthy diet, low salt and stay active. Of note, she had a cold for the last few days as has been taking Mucinex DM and sporadic decongestant OTC. BP remains very good RTC 05-2016, CPX

## 2016-03-19 NOTE — Progress Notes (Signed)
Pre visit review using our clinic review tool, if applicable. No additional management support is needed unless otherwise documented below in the visit note. 

## 2016-03-20 NOTE — Assessment & Plan Note (Signed)
Elevated BP: BP was elevated twice, since then is normal, has no symptoms. When she has checked her BPs before they were normal as well. Rec to continue monitor BPs at home, a healthy diet, low salt and stay active. Of note, she had a cold for the last few days as has been taking Mucinex DM and sporadic decongestant OTC. BP remains very good RTC 05-2016, CPX

## 2016-03-21 ENCOUNTER — Ambulatory Visit
Admission: RE | Admit: 2016-03-21 | Discharge: 2016-03-21 | Disposition: A | Discharge status: Home / Self Care | Payer: BLUE CROSS/BLUE SHIELD | Source: Ambulatory Visit | Attending: Internal Medicine | Admitting: Internal Medicine

## 2016-03-21 DIAGNOSIS — Z1231 Encounter for screening mammogram for malignant neoplasm of breast: Secondary | ICD-10-CM | POA: Diagnosis not present

## 2016-04-07 DIAGNOSIS — D303 Benign neoplasm of bladder: Secondary | ICD-10-CM | POA: Diagnosis not present

## 2016-04-07 DIAGNOSIS — R310 Gross hematuria: Secondary | ICD-10-CM | POA: Diagnosis not present

## 2016-05-19 ENCOUNTER — Encounter: Payer: Self-pay | Admitting: Internal Medicine

## 2016-05-19 ENCOUNTER — Telehealth: Payer: Self-pay | Admitting: Internal Medicine

## 2016-05-19 ENCOUNTER — Ambulatory Visit (INDEPENDENT_AMBULATORY_CARE_PROVIDER_SITE_OTHER): Payer: BLUE CROSS/BLUE SHIELD | Admitting: Internal Medicine

## 2016-05-19 VITALS — BP 122/60 | HR 64 | Temp 98.0°F | Resp 12 | Ht 67.0 in | Wt 151.2 lb

## 2016-05-19 DIAGNOSIS — Z0000 Encounter for general adult medical examination without abnormal findings: Secondary | ICD-10-CM

## 2016-05-19 DIAGNOSIS — Z1159 Encounter for screening for other viral diseases: Secondary | ICD-10-CM | POA: Diagnosis not present

## 2016-05-19 LAB — COMPREHENSIVE METABOLIC PANEL
ALT: 14 U/L (ref 0–35)
AST: 17 U/L (ref 0–37)
Albumin: 4.2 g/dL (ref 3.5–5.2)
Alkaline Phosphatase: 70 U/L (ref 39–117)
BUN: 9 mg/dL (ref 6–23)
CO2: 30 mEq/L (ref 19–32)
Calcium: 9.3 mg/dL (ref 8.4–10.5)
Chloride: 106 mEq/L (ref 96–112)
Creatinine, Ser: 0.67 mg/dL (ref 0.40–1.20)
GFR: 117.86 mL/min (ref >60.00)
GLUCOSE: 95 mg/dL (ref 70–99)
Potassium: 3.7 mEq/L (ref 3.5–5.1)
SODIUM: 140 meq/L (ref 135–145)
TOTAL PROTEIN: 7 g/dL (ref 6.0–8.3)
Total Bilirubin: 0.8 mg/dL (ref 0.2–1.2)

## 2016-05-19 LAB — CBC WITH DIFFERENTIAL/PLATELET
BASOS ABS: 0 10*3/uL (ref 0.0–0.1)
Basophils Relative: 1 % (ref 0.0–3.0)
EOS PCT: 1.5 % (ref 0.0–5.0)
Eosinophils Absolute: 0.1 10*3/uL (ref 0.0–0.7)
HCT: 41.4 % (ref 36.0–46.0)
Hemoglobin: 13.9 g/dL (ref 12.0–15.0)
LYMPHS ABS: 0.6 10*3/uL — AB (ref 0.7–4.0)
Lymphocytes Relative: 11.6 % — ABNORMAL LOW (ref 12.0–46.0)
MCHC: 33.7 g/dL (ref 30.0–36.0)
MCV: 97.8 fl (ref 78.0–100.0)
MONO ABS: 0.6 10*3/uL (ref 0.1–1.0)
MONOS PCT: 13.4 % — AB (ref 3.0–12.0)
NEUTROS ABS: 3.4 10*3/uL (ref 1.4–7.7)
NEUTROS PCT: 72.5 % (ref 43.0–77.0)
PLATELETS: 224 10*3/uL (ref 150.0–400.0)
RBC: 4.23 Mil/uL (ref 3.87–5.11)
RDW: 13.9 % (ref 11.5–15.5)
WBC: 4.7 10*3/uL (ref 4.0–10.5)

## 2016-05-19 LAB — LIPID PANEL
CHOL/HDL RATIO: 2
Cholesterol: 199 mg/dL (ref 0–200)
HDL: 94 mg/dL (ref >39.00)
LDL Cholesterol: 84 mg/dL (ref 0–99)
NONHDL: 104.86
TRIGLYCERIDES: 105 mg/dL (ref 0.0–149.0)
VLDL: 21 mg/dL (ref 0.0–40.0)

## 2016-05-19 LAB — HEPATITIS C ANTIBODY: HCV AB: NEGATIVE

## 2016-05-19 LAB — TSH: TSH: 0.99 u[IU]/mL (ref 0.35–4.50)

## 2016-05-19 NOTE — Patient Instructions (Signed)
GO TO THE LAB : Get the blood work     GO TO THE FRONT DESK Schedule your next appointment for a   physical exam in one year   Check the  blood pressure 2 or 3 times a month  Be sure your blood pressure is between 110/65 and  145/85.  if it is consistently higher or lower, let me know

## 2016-05-19 NOTE — Progress Notes (Signed)
Subjective:    Patient ID: Erin Fox, female    DOB: Apr 10, 1960, 56 y.o.   MRN: [id number]  DOS:  05/19/2016 Type of visit - description : cpx Interval history: No major concerns since last year.  Review of Systems Has chronic lower extremity edema since the 80s. Sees the vein specialist. Occasional back pain, much improved with stretching.   Other than above, a 14 point review of systems is negative     Past Medical History:  Diagnosis Date  . Anxiety   . Cyst on ear    Left ear  . Diverticulosis of colon   . Erythematous bladder mucosa   . Hematuria    w/u neg ~ 01-2014, 03-2014  . History of colon polyps   . HSV (herpes simplex virus) infection   . Hyperlipidemia   . Insomnia   . Nephrolithiasis    NON-OBSTRUCTIVE  . OAB (overactive bladder)   . Wears glasses     Past Surgical History:  Procedure Laterality Date  . 5th toes resected bilateral  1990's   bone's removed  . ABDOMINAL HYSTERECTOMY  1984   W/ UNILATERAL SALPINGOOPHORECTOMY  . ABDOMINOPLASTY  1990  . BREAST BIOPSY Right 01-02-2004  . CESAREAN SECTION    . COLONOSCOPY W/ POLYPECTOMY  last one 02-14-2014  . CYSTO/  RETROGRADE PYELOGRAM/  LEFT URETEROSCOPY/  BLADDER BIOPSY  08-27-2004  . CYSTOSCOPY W/ RETROGRADES Bilateral 03/17/2014   Procedure: CYSTOSCOPY WITH RETROGRADE PYELOGRAM;  Surgeon: Alexis Frock, MD;  Location: [facility];  Service: Urology;  Laterality: Bilateral;  . CYSTOSCOPY WITH BIOPSY N/A 03/17/2014   Procedure: CYSTOSCOPY WITH BIOPSY AND FULGERATION;  Surgeon: Alexis Frock, MD;  Location: [facility];  Service: Urology;  Laterality: N/A;  . OTHER SURGICAL HISTORY Left 2016   Cyst Removal on Left ear  . REMOVAL BARTHOLIN CYST/ GLAND AND REVISION LEFT LABIA  06-10-2004   Family History  Problem Relation Age of Onset  . Hyperlipidemia Mother   . Hypertension Mother     M and sister   . Diabetes Father   . Colon polyps Sister 40    8 polyps  removed  . High blood pressure Sister   . Cancer Paternal Aunt     lung  . Colon cancer Other     uncle , dx at age 71s  . Colon cancer Paternal Uncle   . Breast cancer Neg Hx     Social History   Social History  . Marital status: Divorced    Spouse name: N/A  . Number of children: 1  . 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 Smoking status: Current Every Day Smoker    Packs/day: 0.50    Years: 30.00    Types: Cigarettes  . Smokeless tobacco: Never Used     Comment: 3/4  ppd  . Alcohol use 0.0 oz/week     Comment: OCCASIONAL  . Drug use: Yes    Types: Marijuana     Comment: "every other weekend" per pt.  . Sexual activity: Not on file   Other Topics Concern  . Not on file   Social History Narrative   Lives by herself       Allergies as of 05/19/2016      Reactions   Latex Rash      Medication List       Accurate as of 05/19/16 11:59  PM. Always use your most recent med list.          acyclovir 400 MG tablet Commonly known as:  ZOVIRAX Take 400 mg by mouth 2 (two) times daily as needed.   fluticasone 50 MCG/ACT nasal spray Commonly known as:  FLONASE Place 2 sprays into both nostrils daily.   simvastatin 20 MG tablet Commonly known as:  ZOCOR Take 1 tablet (20 mg total) by mouth at bedtime.          Objective:   Physical Exam BP 122/60 (BP Location: Left Arm, Patient Position: Sitting, Cuff Size: Small)   Pulse 64   Temp 98 F (36.7 C) (Oral)   Resp 12   Ht 5\' 7"  (1.702 m)   Wt 151 lb 4 oz (68.6 kg)   SpO2 97%   BMI 23.69 kg/m   General:   Well developed, well nourished . NAD.  Neck: No  thyromegaly  HEENT:  Normocephalic . Face symmetric, atraumatic Breast: no dominant mass, skin and nipples normal to inspection on palpation, axillary areas without mass or lymphadenopathy Lungs:  CTA B Normal respiratory effort, no intercostal retractions, no  accessory muscle use. Heart: RRR,  no murmur.  L leg larger in diameter, at baseline Abdomen:  Not distended, soft, non-tender. No rebound or rigidity.   Skin: Exposed areas without rash. Not pale. Not jaundice. Multiple skin lesions. Neurologic:  alert & oriented X3.  Speech normal, gait appropriate for age and unassisted Strength symmetric and appropriate for age.  Psych: Cognition and judgment appear intact.  Cooperative with normal attention span and concentration.  Behavior appropriate. No anxious or depressed appearing.    Assessment & Plan:   Assessment Anxiety, insomnia Hyperlipidemia HSV L leg edema,Venous insufficiency, since the 80s after she had her child. Over active bladder, s/p cysto   >1, 2011, 2016, etc, see urology notes  Non-obstructive nephrolithiasis  Plan: Hyperlipidemia: Continue simvastatin, check labs HSV: On acyclovir prn Left leg edema: Chronic, + itching, swelling and discomfort,  Seen by a specialist. Patient requests to send a note to the  vein specialists. We'll do Elevated BP?: BP today is normal, recommend observation. RTC one year

## 2016-05-19 NOTE — Assessment & Plan Note (Signed)
--  Td 2016 ; zostavax discussed before  --PAP done 2012, never had an abnormal one; h/o hysterectomy for benign reasons, not sexually active. Per guidelines no need for further PAPs, she is asx  --MMG (-) 2-90-18, (-) breast exam  today --CCS: Colonoscopy November 2010: Polyps, diverticula, hemorrhoids. Cscope 1-16, no polyps, next 7 years per report Dr Deatra Ina.  --Tobacco abuse: Smoking 3/4 ppd, not ready to quit, advised to call me when/if ready. --Diet and exercise discussed --Labs : Hep C, CMP, FLP, CBC, TSH

## 2016-05-19 NOTE — Progress Notes (Signed)
Pre visit review using our clinic review tool, if applicable. No additional management support is needed unless otherwise documented below in the visit note. 

## 2016-05-19 NOTE — Telephone Encounter (Signed)
Pt dropped off signed MR release from [facility] requesting records from Dr. Larose Kells to be sent to them regarding leg symptoms & venous insufficiency. Scanned release and put in bin for MR dept personnel.

## 2016-05-20 NOTE — Telephone Encounter (Signed)
OV note from 05/19/2016 w/ PCP faxed to Dr. Elza Rafter at [facility] and [facility], [place] at [phone].

## 2016-05-20 NOTE — Assessment & Plan Note (Signed)
Hyperlipidemia: Continue simvastatin, check labs HSV: On acyclovir prn Left leg edema: Chronic, + itching, swelling and discomfort,  Seen by a specialist. Patient requests to send a note to the  vein specialists. We'll do Elevated BP?: BP today is normal, recommend observation. RTC one year

## 2016-05-21 DIAGNOSIS — I8312 Varicose veins of left lower extremity with inflammation: Secondary | ICD-10-CM | POA: Diagnosis not present

## 2016-06-11 ENCOUNTER — Other Ambulatory Visit: Payer: Self-pay | Admitting: Internal Medicine

## 2016-06-11 NOTE — Telephone Encounter (Signed)
Self.    Pt is requesting a refill simvastatin  Pharmacy: [place], [place].

## 2016-06-11 NOTE — Telephone Encounter (Signed)
Rx has already been approved and sent to pharmacy.

## 2016-07-22 ENCOUNTER — Telehealth: Payer: Self-pay

## 2016-07-22 NOTE — Telephone Encounter (Signed)
Patient walked in today with complaints of BP being elevated. States she has had headache with small pulsation behind eyes. Patient denies SOB,Chest pain or pain in neck or arms.  Vital signs to day are as follows: 143/93,63,98.4,99%.  Patient states she has recorded the following BP's 07/09/16- 130/86,141/90  07/21/16- 150/94,149/92,150/96  Per Dr. Larose Kells patient can be seen today with another provider or by him on tomorrow. He has no appointments available for today.   Patient prefers to be seen by Dr. Larose Kells. States she will wait until tomorrow.Will be seen tomorrow by Dr. Larose Kells. Advised paitent if BP becomes more elevated and she develops chest pain or shortness of breath to to go directly to UC or ED. Patient agreed.

## 2016-07-23 ENCOUNTER — Encounter: Payer: Self-pay | Admitting: Internal Medicine

## 2016-07-23 ENCOUNTER — Ambulatory Visit (INDEPENDENT_AMBULATORY_CARE_PROVIDER_SITE_OTHER): Payer: BLUE CROSS/BLUE SHIELD | Admitting: Internal Medicine

## 2016-07-23 VITALS — BP 132/86 | HR 76 | Temp 98.0°F | Resp 14 | Ht 67.0 in | Wt 150.2 lb

## 2016-07-23 DIAGNOSIS — R030 Elevated blood-pressure reading, without diagnosis of hypertension: Secondary | ICD-10-CM

## 2016-07-23 MED ORDER — CARVEDILOL 3.125 MG PO TABS: 3.1250 mg | ORAL_TABLET | Freq: Two times a day (BID) | ORAL | 3 refills | Status: DC

## 2016-07-23 NOTE — Progress Notes (Signed)
Pre visit review using our clinic review tool, if applicable. No additional management support is needed unless otherwise documented below in the visit note. 

## 2016-07-23 NOTE — Progress Notes (Signed)
Subjective:    Patient ID: Erin Fox, female    DOB: 1960-07-06, 56 y.o.   MRN: [id number]  DOS:  07/23/2016 Type of visit - description : acute Interval history: Has a mild headache last week, took Sudafed for 2 days, few days later the headache resurface and she started to check her blood pressure:  141/90,  07/21/16- 150/94,149/92,150/96 She is very concerned and anxious about it. Has not taken any excessive salt lately.  BP Readings from Last 3 Encounters:  07/23/16 132/86  05/19/16 122/60  03/19/16 126/74     Review of Systems  denies chest pain, difficulty breathing, no lower extremity edema No excessively stress in her life.  no face numbness, double vision, motor deficits  Past Medical History:  Diagnosis Date  . Anxiety   . Cyst on ear    Left ear  . Diverticulosis of colon   . Erythematous bladder mucosa   . Hematuria    w/u neg ~ 01-2014, 03-2014  . History of colon polyps   . HSV (herpes simplex virus) infection   . Hyperlipidemia   . Insomnia   . Nephrolithiasis    NON-OBSTRUCTIVE  . OAB (overactive bladder)   . Wears glasses     Past Surgical History:  Procedure Laterality Date  . 5th toes resected bilateral  1990's   bone's removed  . ABDOMINAL HYSTERECTOMY  1984   W/ UNILATERAL SALPINGOOPHORECTOMY  . ABDOMINOPLASTY  1990  . BREAST BIOPSY Right 01-02-2004  . CESAREAN SECTION    . COLONOSCOPY W/ POLYPECTOMY  last one 02-14-2014  . CYSTO/  RETROGRADE PYELOGRAM/  LEFT URETEROSCOPY/  BLADDER BIOPSY  08-27-2004  . CYSTOSCOPY W/ RETROGRADES Bilateral 03/17/2014   Procedure: CYSTOSCOPY WITH RETROGRADE PYELOGRAM;  Surgeon: Alexis Frock, MD;  Location: [facility];  Service: Urology;  Laterality: Bilateral;  . CYSTOSCOPY WITH BIOPSY N/A 03/17/2014   Procedure: CYSTOSCOPY WITH BIOPSY AND FULGERATION;  Surgeon: Alexis Frock, MD;  Location: [facility];  Service: Urology;  Laterality: N/A;  . OTHER SURGICAL HISTORY Left  2016   Cyst Removal on Left ear  . REMOVAL BARTHOLIN CYST/ GLAND AND REVISION LEFT LABIA  06-10-2004    Social History   Social History  . Marital status: Divorced    Spouse name: N/A  . Number of children: 1  . 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 Smoking status: Current Every Day Smoker    Packs/day: 0.50    Years: 30.00    Types: Cigarettes  . Smokeless tobacco: Never Used     Comment: 3/4  ppd  . Alcohol use 0.0 oz/week     Comment: OCCASIONAL  . Drug use: Yes    Types: Marijuana     Comment: "every other weekend" per pt.  . Sexual activity: Not on file   Other Topics Concern  . Not on file   Social History Narrative   Lives by herself       Allergies as of 07/23/2016      Reactions   Latex Rash      Medication List       Accurate as of 07/23/16 11:59 PM. Always use your most recent med list.          acyclovir 400 MG tablet Commonly known as:  ZOVIRAX Take 400 mg by mouth 2 (two) times daily as needed.   carvedilol 3.125 MG tablet  Commonly known as:  COREG Take 1 tablet (3.125 mg total) by mouth 2 (two) times daily with a meal.   fluticasone 50 MCG/ACT nasal spray Commonly known as:  FLONASE Place 2 sprays into both nostrils daily.   simvastatin 20 MG tablet Commonly known as:  ZOCOR Take 1 tablet (20 mg total) by mouth at bedtime.          Objective:   Physical Exam BP 132/86 (BP Location: Left Arm, Patient Position: Sitting, Cuff Size: Small)   Pulse 76   Temp 98 F (36.7 C) (Oral)   Resp 14   Ht 5\' 7"  (1.702 m)   Wt 150 lb 4 oz (68.2 kg)   SpO2 96%   BMI 23.53 kg/m  General:   Well developed, well nourished . NAD.  HEENT:  Normocephalic . Face symmetric, atraumatic Lungs:  CTA B Normal respiratory effort, no intercostal retractions, no accessory muscle use. Heart: RRR,  no murmur.  No pitting edema, left leg is larger, and baseline.    Skin: Not pale. Not jaundice Neurologic:  alert & oriented X3.  Speech normal, gait appropriate for age and unassisted Psych--  Cognition and judgment appear intact.  Cooperative with normal attention span and concentration.  Behavior appropriate.      Assessment & Plan:   Assessment Anxiety, insomnia Hyperlipidemia HSV L leg edema,Venous insufficiency, since the 80s after she had her child. Over active bladder, s/p cysto   >1, 2011, 2016, etc, see urology notes  Non-obstructive nephrolithiasis  Plan: Elevated BP: Patient very stressed about BP being mildly elevated lately, BP today looks good. Because she is so anxious I think a low-dose of beta blockers will help to keep her blood pressure in the lower side. Rx carvedilol, she become very emotional when I told her I rec a "BP medications". Counseled, we are simply trying to be proactive prevent her from having occ elevated BPs. Recommend to continue low-salt diet, avoid decongestants, monitor BPs RTC 4 months.

## 2016-07-23 NOTE — Patient Instructions (Signed)
  GO TO THE FRONT DESK Schedule your next appointment for a  checkup in 4 months  Start carvedilol, one tablet twice a day.  Check the  blood pressure 2 or 3 times a  Week   Be sure your blood pressure is between 110/65 and  145/85. If it is consistently higher or lower, let me know   If you have severe symptom:  headaches, chest pain, difficulty breathing: Call or go to the ER

## 2016-07-24 NOTE — Assessment & Plan Note (Signed)
Elevated BP: Patient very stressed about BP being mildly elevated lately, BP today looks good. Because she is so anxious I think a low-dose of beta blockers will help to keep her blood pressure in the lower side. Rx carvedilol, she become very emotional when I told her I rec a "BP medications". Counseled, we are simply trying to be proactive prevent her from having occ elevated BPs. Recommend to continue low-salt diet, avoid decongestants, monitor BPs RTC 4 months.

## 2016-07-31 ENCOUNTER — Ambulatory Visit: Payer: BLUE CROSS/BLUE SHIELD | Admitting: Family Medicine

## 2016-07-31 VITALS — BP 131/89 | HR 66

## 2016-07-31 DIAGNOSIS — R030 Elevated blood-pressure reading, without diagnosis of hypertension: Secondary | ICD-10-CM

## 2016-07-31 DIAGNOSIS — I10 Essential (primary) hypertension: Secondary | ICD-10-CM

## 2016-07-31 MED ORDER — CARVEDILOL 6.25 MG PO TABS: 6.2500 mg | ORAL_TABLET | Freq: Two times a day (BID) | ORAL | 3 refills | Status: DC

## 2016-07-31 NOTE — Patient Instructions (Signed)
Took BP medication prior to coming to office  Today. BP= 131/89 P=66. [place] patient to increase  Carvedilol to 6.25 mg twice daily and return for nurse visit in 2 weeks. Appointment scheduled for August 14 2016 @ 10:00 am. Patient to scheduled with Dr. Larose Kells in 1 month.

## 2016-07-31 NOTE — Progress Notes (Signed)
Nurseblood pressure check note reviewed. Agree with documention and plan. 

## 2016-07-31 NOTE — Progress Notes (Signed)
Pre visit review using our clinic tool,if applicable. No additional management support is needed unless otherwise documented below in the visit note.   Patient walk in again for fluctuating BP. States she has had dizziness however she has no dizziness at this time.   Patient states she was at work yesterday and was told her BP was too high but she did have  Dizziness.  Took BP medication prior to coming to office  Today. BP= 131/89 P=66. [place] patient to increase  Carvedilol to 6.25 mg twice daily and return for nurse visit in 2 weeks. Appointment scheduled for August 14 2016 @ 10:00 am. Patient to scheduled with Dr. Larose Kells in 1 month.  Nurse blood pressure check note reviewed. Agree with documention and plan.

## 2016-08-14 ENCOUNTER — Ambulatory Visit (INDEPENDENT_AMBULATORY_CARE_PROVIDER_SITE_OTHER): Payer: BLUE CROSS/BLUE SHIELD | Admitting: Internal Medicine

## 2016-08-14 VITALS — BP 132/87 | HR 61

## 2016-08-14 DIAGNOSIS — I10 Essential (primary) hypertension: Secondary | ICD-10-CM | POA: Diagnosis not present

## 2016-08-14 MED ORDER — CARVEDILOL 12.5 MG PO TABS: 12.5000 mg | ORAL_TABLET | Freq: Two times a day (BID) | ORAL | 3 refills | Status: DC

## 2016-08-14 NOTE — Progress Notes (Addendum)
Pre visit review using our clinic tool,if applicable. No additional management support is needed unless otherwise documented below in the visit note.   Patient in for BP check per order from Dr. Charlett Blake who was on call on patients last visit.  Patient states she has had slight headache. No complaints at this time. .Patient took last dose of BP medication last pm states she will take 2nd dose at around 11 am today. States BP has been running in the 140's+  Blood Pressure today =131/89 P =61  Per Dr. Larose Kells increase Carvedilol to 12.5mg  twice daily. Cal office with BP readings in 3 weeks. Patient advised.   Kathlene November, MD

## 2016-09-03 ENCOUNTER — Ambulatory Visit (INDEPENDENT_AMBULATORY_CARE_PROVIDER_SITE_OTHER): Payer: BLUE CROSS/BLUE SHIELD | Admitting: Internal Medicine

## 2016-09-03 ENCOUNTER — Encounter: Payer: Self-pay | Admitting: Internal Medicine

## 2016-09-03 VITALS — BP 146/100 | HR 54 | Temp 98.2°F | Ht 67.0 in | Wt 149.5 lb

## 2016-09-03 DIAGNOSIS — R0989 Other specified symptoms and signs involving the circulatory and respiratory systems: Secondary | ICD-10-CM

## 2016-09-03 DIAGNOSIS — I10 Essential (primary) hypertension: Secondary | ICD-10-CM | POA: Diagnosis not present

## 2016-09-03 MED ORDER — AMLODIPINE BESYLATE 5 MG PO TABS: 5.0000 mg | ORAL_TABLET | Freq: Every day | ORAL | 6 refills | Status: DC

## 2016-09-03 NOTE — Progress Notes (Signed)
Subjective:    Patient ID: Erin Fox, female    DOB: [birth year], 56 y.o.   MRN: [id number]  DOS:  09/03/2016 Type of visit - description : Follow-up Interval history: Since the last visit, is taking carvedilol correctly, no apparent side effects. Ambulatory BPs continue to be elevated. She realizes that many family members are treated for HTN, less apprehensive than before   Review of Systems Eating healthier. Does not take frequent NSAIDs. + Low-salt diet. Denies chest pain, difficulty breathing, lower extremity edema. No nausea or vomiting.  Past Medical History:  Diagnosis Date  . Anxiety   . Cyst on ear    Left ear  . Diverticulosis of colon   . Erythematous bladder mucosa   . Hematuria    w/u neg ~ 01-2014, 03-2014  . History of colon polyps   . HSV (herpes simplex virus) infection   . Hyperlipidemia   . Insomnia   . Nephrolithiasis    NON-OBSTRUCTIVE  . OAB (overactive bladder)   . Wears glasses     Past Surgical History:  Procedure Laterality Date  . 5th toes resected bilateral  1990's   bone's removed  . ABDOMINAL HYSTERECTOMY  1984   W/ UNILATERAL SALPINGOOPHORECTOMY  . ABDOMINOPLASTY  1990  . BREAST BIOPSY Right 01-02-2004  . CESAREAN SECTION    . COLONOSCOPY W/ POLYPECTOMY  last one 02-14-2014  . CYSTO/  RETROGRADE PYELOGRAM/  LEFT URETEROSCOPY/  BLADDER BIOPSY  08-27-2004  . CYSTOSCOPY W/ RETROGRADES Bilateral 03/17/2014   Procedure: CYSTOSCOPY WITH RETROGRADE PYELOGRAM;  Surgeon: Alexis Frock, MD;  Location: [facility];  Service: Urology;  Laterality: Bilateral;  . CYSTOSCOPY WITH BIOPSY N/A 03/17/2014   Procedure: CYSTOSCOPY WITH BIOPSY AND FULGERATION;  Surgeon: Alexis Frock, MD;  Location: [facility];  Service: Urology;  Laterality: N/A;  . OTHER SURGICAL HISTORY Left 2016   Cyst Removal on Left ear  . REMOVAL BARTHOLIN CYST/ GLAND AND REVISION LEFT LABIA  06-10-2004    Social History   Social History  .  Marital status: Divorced    Spouse name: N/A  . Number of children: 1  . 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 Smoking status: Current Every Day Smoker    Packs/day: 0.50    Years: 30.00    Types: Cigarettes  . Smokeless tobacco: Never Used     Comment: 3/4  ppd  . Alcohol use 0.0 oz/week     Comment: OCCASIONAL  . Drug use: Yes    Types: Marijuana     Comment: "every other weekend" per pt.  . Sexual activity: Not on file   Other Topics Concern  . Not on file   Social History Narrative   Lives by herself       Allergies as of 09/03/2016      Reactions   Latex Rash      Medication List       Accurate as of 09/03/16 10:52 AM. Always use your most recent med list.          acyclovir 400 MG tablet Commonly known as:  ZOVIRAX Take 400 mg by mouth 2 (two) times daily as needed.   carvedilol 12.5 MG tablet Commonly known as:  COREG Take 1 tablet (12.5 mg total) by mouth 2 (two) times daily with a meal.   fluticasone 50 MCG/ACT nasal spray Commonly known as:  [place]  2 sprays into both nostrils daily.   simvastatin 20 MG tablet Commonly known as:  ZOCOR Take 1 tablet (20 mg total) by mouth at bedtime.          Objective:   Physical Exam BP (!) 146/100 (BP Location: Right Arm, Patient Position: Sitting, Cuff Size: Small)   Pulse (!) 54   Temp 98.2 F (36.8 C) (Oral)   Ht 5\' 7"  (1.702 m)   Wt 149 lb 8 oz (67.8 kg)   SpO2 97%   BMI 23.42 kg/m  General:   Well developed, well nourished . NAD.  HEENT:  Normocephalic . Face symmetric, atraumatic Lungs:  CTA B Normal respiratory effort, no intercostal retractions, no accessory muscle use. Heart: RRR,  no murmur.  no pretibial edema bilaterally  Abdomen:  Not distended, soft, non-tender. Palpable aorta, left from the umbilicus, no bruit, nontender Skin: Not pale. Not jaundice Neurologic:  alert & oriented X3.    Speech normal, gait appropriate for age and unassisted Psych--  Cognition and judgment appear intact.  Cooperative with normal attention span and concentration.  Behavior appropriate. No anxious or depressed appearing.    Assessment & Plan:   Assessment HTN: Onset 07-2016 Anxiety, insomnia Hyperlipidemia HSV L leg edema,Venous insufficiency, since the 80s after she had her child. Over active bladder, s/p cysto   >1, 2011, 2016, etc, see urology notes  Non-obstructive nephrolithiasis  Plan: HTN: BP started to be elevated approximately 2 months ago, rx carvedilol, dose gradually increased  to 12.5 twice a day. Ambulatory BPs still elevated  147, 975 w/ a diastolic in the 30Y. Heart rate 53. She has improved her diet, recent TSH and CBC are normal. EKG today sinus bradycardia. Plan: Add amlodipine, check ultrasound of the aorta (see exam), recheck a BMP. RTC 3 months.

## 2016-09-03 NOTE — Patient Instructions (Signed)
GO TO THE LAB : Get the blood work     GO TO THE FRONT DESK Schedule your next appointment for a  checkup in 3 months  Add amlodipine 1 tablet daily  Continue with your low salt diet  Blood pressure goal:  between 110/65 and  140/85. If it is consistently higher or lower, let me know

## 2016-09-03 NOTE — Progress Notes (Signed)
Pre visit review using our clinic review tool, if applicable. No additional management support is needed unless otherwise documented below in the visit note. 

## 2016-09-04 NOTE — Assessment & Plan Note (Signed)
HTN: BP started to be elevated approximately 2 months ago, rx carvedilol, dose gradually increased  to 12.5 twice a day. Ambulatory BPs still elevated  147, 882 w/ a diastolic in the 80K. Heart rate 53. She has improved her diet, recent TSH and CBC are normal. EKG today sinus bradycardia. Plan: Add amlodipine, check ultrasound of the aorta (see exam), recheck a BMP. RTC 3 months.

## 2016-11-15 ENCOUNTER — Other Ambulatory Visit: Payer: Self-pay | Admitting: Internal Medicine

## 2016-11-20 ENCOUNTER — Ambulatory Visit (INDEPENDENT_AMBULATORY_CARE_PROVIDER_SITE_OTHER): Payer: BLUE CROSS/BLUE SHIELD | Admitting: Internal Medicine

## 2016-11-20 ENCOUNTER — Encounter: Payer: Self-pay | Admitting: Internal Medicine

## 2016-11-20 VITALS — BP 122/78 | HR 59 | Temp 97.8°F | Resp 14 | Ht 67.0 in | Wt 148.2 lb

## 2016-11-20 DIAGNOSIS — I10 Essential (primary) hypertension: Secondary | ICD-10-CM | POA: Diagnosis not present

## 2016-11-20 DIAGNOSIS — R0989 Other specified symptoms and signs involving the circulatory and respiratory systems: Secondary | ICD-10-CM | POA: Diagnosis not present

## 2016-11-20 DIAGNOSIS — Z23 Encounter for immunization: Secondary | ICD-10-CM | POA: Diagnosis not present

## 2016-11-20 DIAGNOSIS — F411 Generalized anxiety disorder: Secondary | ICD-10-CM | POA: Diagnosis not present

## 2016-11-20 MED ORDER — ACYCLOVIR 400 MG PO TABS: 400.0000 mg | ORAL_TABLET | Freq: Two times a day (BID) | ORAL | 1 refills | Status: DC | PRN

## 2016-11-20 NOTE — Patient Instructions (Signed)
GO TO THE FRONT DESK Schedule your next appointment for a  physical exam by April 2019   Check the  blood pressure   monthly   Be sure your blood pressure is between 110/65 and  135/85. If it is consistently higher or lower, let me know

## 2016-11-20 NOTE — Progress Notes (Signed)
Pre visit review using our clinic review tool, if applicable. No additional management support is needed unless otherwise documented below in the visit note. 

## 2016-11-20 NOTE — Progress Notes (Signed)
Subjective:    Patient ID: Erin Fox, female    DOB: 01/18/1961, 56 y.o.   MRN: [id number]  DOS:  11/20/2016 Type of visit - description : rov Interval history: HTN: Amlodipine was added, she is doing great, BP when check is ~ 115/81 Anxiety: Her elderly mother moved in with her, she has some health issues, + stress, she seems to be handling things very good Needs a refill on Valtrex   Review of Systems No chest pain or difficulty breathing. Lower extremity swelling at baseline  Past Medical History:  Diagnosis Date  . Anxiety   . Cyst on ear    Left ear  . Diverticulosis of colon   . Erythematous bladder mucosa   . Hematuria    w/u neg ~ 01-2014, 03-2014  . History of colon polyps   . HSV (herpes simplex virus) infection   . Hyperlipidemia   . Insomnia   . Nephrolithiasis    NON-OBSTRUCTIVE  . OAB (overactive bladder)   . Wears glasses     Past Surgical History:  Procedure Laterality Date  . 5th toes resected bilateral  1990's   bone's removed  . ABDOMINAL HYSTERECTOMY  1984   W/ UNILATERAL SALPINGOOPHORECTOMY  . ABDOMINOPLASTY  1990  . BREAST BIOPSY Right 01-02-2004  . CESAREAN SECTION    . COLONOSCOPY W/ POLYPECTOMY  last one 02-14-2014  . CYSTO/  RETROGRADE PYELOGRAM/  LEFT URETEROSCOPY/  BLADDER BIOPSY  08-27-2004  . CYSTOSCOPY W/ RETROGRADES Bilateral 03/17/2014   Procedure: CYSTOSCOPY WITH RETROGRADE PYELOGRAM;  Surgeon: Alexis Frock, MD;  Location: [facility];  Service: Urology;  Laterality: Bilateral;  . CYSTOSCOPY WITH BIOPSY N/A 03/17/2014   Procedure: CYSTOSCOPY WITH BIOPSY AND FULGERATION;  Surgeon: Alexis Frock, MD;  Location: [facility];  Service: Urology;  Laterality: N/A;  . OTHER SURGICAL HISTORY Left 2016   Cyst Removal on Left ear  . REMOVAL BARTHOLIN CYST/ GLAND AND REVISION LEFT LABIA  06-10-2004    Social History   Social History  . Marital status: Divorced    Spouse name: N/A  . Number of  children: 1  . 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 Smoking status: Current Every Day Smoker    Packs/day: 0.50    Years: 30.00    Types: Cigarettes  . Smokeless tobacco: Never Used     Comment: 3/4  ppd  . Alcohol use 0.0 oz/week     Comment: OCCASIONAL  . Drug use: Yes    Types: Marijuana     Comment: "every other weekend" per pt.  . Sexual activity: Not on file   Other Topics Concern  . Not on file   Social History Narrative   Her mother lives w/ her          Allergies as of 11/20/2016      Reactions   Latex Rash      Medication List       Accurate as of 11/20/16 11:59 PM. Always use your most recent med list.          acyclovir 400 MG tablet Commonly known as:  ZOVIRAX Take 1 tablet (400 mg total) by mouth 2 (two) times daily as needed.   amLODipine 5 MG tablet Commonly known as:  NORVASC Take 1 tablet (5 mg total) by mouth daily.   carvedilol 12.5 MG tablet Commonly known as:  COREG Take  1 tablet (12.5 mg total) by mouth 2 (two) times daily with a meal.   fluticasone 50 MCG/ACT nasal spray Commonly known as:  FLONASE Place 2 sprays into both nostrils daily.   simvastatin 20 MG tablet Commonly known as:  ZOCOR Take 1 tablet (20 mg total) by mouth at bedtime.          Objective:   Physical Exam BP 122/78 (BP Location: Right Arm, Patient Position: Sitting, Cuff Size: Small)   Pulse (!) 59   Temp 97.8 F (36.6 C) (Oral)   Resp 14   Ht 5\' 7"  (1.702 m)   Wt 148 lb 4 oz (67.2 kg)   SpO2 97%   BMI 23.22 kg/m  General:   Well developed, well nourished . NAD.  HEENT:  Normocephalic . Face symmetric, atraumatic Lungs:  CTA B Normal respiratory effort, no intercostal retractions, no accessory muscle use. Heart: RRR,  no murmur.  no pretibial edema bilaterally  Abdomen:  Not distended, soft. Palpable nontender aorta left from the umbilicus. No  bruit. Skin: Not pale. Not jaundice Neurologic:  alert & oriented X3.  Speech normal, gait appropriate for age and unassisted Psych--  Cognition and judgment appear intact.  Cooperative with normal attention span and concentration.  Behavior appropriate. No anxious or depressed appearing.     Assessment & Plan:   Assessment HTN: Onset 07-2016 Anxiety, insomnia Hyperlipidemia HSV L leg edema,Venous insufficiency, since the 80s after she had her child. Over active bladder, s/p cysto   >1, 2011, 2016, etc, see urology notes  Non-obstructive nephrolithiasis  Plan: HTN: On Coreg, amlodipine was added 3 months ago, good compliance, no s/e, BP is very good. No change Anxiety insomnia: Her mother moved in with her, + stress, she seems to be doing very well however. Counseled, observation. HSV: Refill Acyclovir Palpable aorta: [place] ordered but not done, reorder. RTC 6 months CPX

## 2016-11-21 NOTE — Assessment & Plan Note (Signed)
HTN: On Coreg, amlodipine was added 3 months ago, good compliance, no s/e, BP is very good. No change Anxiety insomnia: Her mother moved in with her, + stress, she seems to be doing very well however. Counseled, observation. HSV: Refill Acyclovir Palpable aorta: [place] ordered but not done, reorder. RTC 6 months CPX

## 2016-11-28 ENCOUNTER — Other Ambulatory Visit: Payer: Self-pay | Admitting: Internal Medicine

## 2016-11-28 DIAGNOSIS — R0989 Other specified symptoms and signs involving the circulatory and respiratory systems: Secondary | ICD-10-CM

## 2016-12-15 ENCOUNTER — Ambulatory Visit (HOSPITAL_COMMUNITY)
Admission: RE | Admit: 2016-12-15 | Discharge: 2016-12-15 | Disposition: A | Discharge status: Home / Self Care | Payer: BLUE CROSS/BLUE SHIELD | Source: Ambulatory Visit | Attending: Cardiovascular Disease | Admitting: Cardiovascular Disease

## 2016-12-15 DIAGNOSIS — R0989 Other specified symptoms and signs involving the circulatory and respiratory systems: Secondary | ICD-10-CM | POA: Insufficient documentation

## 2016-12-16 ENCOUNTER — Telehealth: Payer: Self-pay | Admitting: Internal Medicine

## 2016-12-16 MED ORDER — CARVEDILOL 12.5 MG PO TABS: 12.5000 mg | ORAL_TABLET | Freq: Two times a day (BID) | ORAL | 5 refills | Status: DC

## 2016-12-16 NOTE — Telephone Encounter (Signed)
Rx sent 

## 2016-12-16 NOTE — Telephone Encounter (Signed)
°  Relation to LO:VFIE Call back number:[phone] Pharmacy: [place], [place]. [phone] (Phone) [phone] (Fax)     Reason for call:  Patient requesting 60 day supply carvedilol (COREG) 12.5 MG tablet

## 2017-03-12 ENCOUNTER — Other Ambulatory Visit: Payer: Self-pay

## 2017-03-12 DIAGNOSIS — L821 Other seborrheic keratosis: Secondary | ICD-10-CM | POA: Diagnosis not present

## 2017-03-12 DIAGNOSIS — D229 Melanocytic nevi, unspecified: Secondary | ICD-10-CM | POA: Diagnosis not present

## 2017-03-12 DIAGNOSIS — L608 Other nail disorders: Secondary | ICD-10-CM | POA: Diagnosis not present

## 2017-03-12 DIAGNOSIS — L603 Nail dystrophy: Secondary | ICD-10-CM | POA: Diagnosis not present

## 2017-03-18 ENCOUNTER — Other Ambulatory Visit: Payer: Self-pay | Admitting: Internal Medicine

## 2017-03-18 DIAGNOSIS — Z1231 Encounter for screening mammogram for malignant neoplasm of breast: Secondary | ICD-10-CM

## 2017-03-29 ENCOUNTER — Other Ambulatory Visit: Payer: Self-pay | Admitting: Internal Medicine

## 2017-04-09 ENCOUNTER — Ambulatory Visit
Admission: RE | Admit: 2017-04-09 | Discharge: 2017-04-09 | Disposition: A | Discharge status: Home / Self Care | Payer: BLUE CROSS/BLUE SHIELD | Source: Ambulatory Visit | Attending: Internal Medicine | Admitting: Internal Medicine

## 2017-04-09 DIAGNOSIS — Z1231 Encounter for screening mammogram for malignant neoplasm of breast: Secondary | ICD-10-CM | POA: Diagnosis not present

## 2017-05-25 ENCOUNTER — Telehealth: Payer: Self-pay

## 2017-05-25 ENCOUNTER — Ambulatory Visit (HOSPITAL_BASED_OUTPATIENT_CLINIC_OR_DEPARTMENT_OTHER)
Admission: RE | Admit: 2017-05-25 | Discharge: 2017-05-25 | Disposition: A | Discharge status: Home / Self Care | Payer: BLUE CROSS/BLUE SHIELD | Source: Ambulatory Visit | Attending: Internal Medicine | Admitting: Internal Medicine

## 2017-05-25 ENCOUNTER — Ambulatory Visit (INDEPENDENT_AMBULATORY_CARE_PROVIDER_SITE_OTHER): Payer: BLUE CROSS/BLUE SHIELD | Admitting: Internal Medicine

## 2017-05-25 ENCOUNTER — Encounter: Payer: Self-pay | Admitting: Internal Medicine

## 2017-05-25 VITALS — BP 126/74 | HR 64 | Temp 98.2°F | Resp 14 | Ht 67.0 in | Wt 163.5 lb

## 2017-05-25 DIAGNOSIS — Z0000 Encounter for general adult medical examination without abnormal findings: Secondary | ICD-10-CM | POA: Diagnosis not present

## 2017-05-25 DIAGNOSIS — R05 Cough: Secondary | ICD-10-CM | POA: Diagnosis not present

## 2017-05-25 DIAGNOSIS — R059 Cough, unspecified: Secondary | ICD-10-CM

## 2017-05-25 LAB — CBC WITH DIFFERENTIAL/PLATELET
BASOS PCT: 0.8 % (ref 0.0–3.0)
Basophils Absolute: 0 10*3/uL (ref 0.0–0.1)
EOS PCT: 2.2 % (ref 0.0–5.0)
Eosinophils Absolute: 0.1 10*3/uL (ref 0.0–0.7)
HEMATOCRIT: 41.1 % (ref 36.0–46.0)
HEMOGLOBIN: 13.9 g/dL (ref 12.0–15.0)
LYMPHS PCT: 23.3 % (ref 12.0–46.0)
Lymphs Abs: 1 10*3/uL (ref 0.7–4.0)
MCHC: 33.9 g/dL (ref 30.0–36.0)
MCV: 99.2 fl (ref 78.0–100.0)
MONO ABS: 0.4 10*3/uL (ref 0.1–1.0)
MONOS PCT: 9.8 % (ref 3.0–12.0)
Neutro Abs: 2.7 10*3/uL (ref 1.4–7.7)
Neutrophils Relative %: 63.9 % (ref 43.0–77.0)
Platelets: 245 10*3/uL (ref 150.0–400.0)
RBC: 4.14 Mil/uL (ref 3.87–5.11)
RDW: 14.4 % (ref 11.5–15.5)
WBC: 4.2 10*3/uL (ref 4.0–10.5)

## 2017-05-25 LAB — LIPID PANEL
CHOLESTEROL: 201 mg/dL — AB (ref 0–200)
HDL: 110.4 mg/dL (ref >39.00)
LDL Cholesterol: 68 mg/dL (ref 0–99)
NonHDL: 90.81
Total CHOL/HDL Ratio: 2
Triglycerides: 116 mg/dL (ref 0.0–149.0)
VLDL: 23.2 mg/dL (ref 0.0–40.0)

## 2017-05-25 LAB — COMPREHENSIVE METABOLIC PANEL
ALBUMIN: 4.3 g/dL (ref 3.5–5.2)
ALK PHOS: 75 U/L (ref 39–117)
ALT: 19 U/L (ref 0–35)
AST: 22 U/L (ref 0–37)
BILIRUBIN TOTAL: 0.7 mg/dL (ref 0.2–1.2)
BUN: 7 mg/dL (ref 6–23)
CO2: 30 mEq/L (ref 19–32)
Calcium: 9 mg/dL (ref 8.4–10.5)
Chloride: 105 mEq/L (ref 96–112)
Creatinine, Ser: 0.65 mg/dL (ref 0.40–1.20)
GFR: 121.59 mL/min (ref >60.00)
Glucose, Bld: 107 mg/dL — ABNORMAL HIGH (ref 70–99)
POTASSIUM: 3.9 meq/L (ref 3.5–5.1)
SODIUM: 141 meq/L (ref 135–145)
TOTAL PROTEIN: 7.1 g/dL (ref 6.0–8.3)

## 2017-05-25 LAB — TSH: TSH: 1.88 u[IU]/mL (ref 0.35–4.50)

## 2017-05-25 MED ORDER — ALBUTEROL SULFATE HFA 108 (90 BASE) MCG/ACT IN AERS: 2.0000 | INHALATION_SPRAY | Freq: Four times a day (QID) | RESPIRATORY_TRACT | 5 refills | Status: DC | PRN

## 2017-05-25 MED ORDER — BUDESONIDE-FORMOTEROL FUMARATE 160-4.5 MCG/ACT IN AERO: 2.0000 | INHALATION_SPRAY | Freq: Two times a day (BID) | RESPIRATORY_TRACT | 5 refills | Status: DC

## 2017-05-25 NOTE — Assessment & Plan Note (Signed)
--  Td 2016   -- h/o hysterectomy for benign reasons, no h/o abnormal PAPs, had several PAPs after hysterectomy; not sexually active since ~ 2009. Per guidelines no need for further PAPs, she is asx  --MMG (-) 03-2017, declined breast exam  today --CCS: Cscope 11/ 2010: Polyps, diverticula, hemorrhoids. Cscope 1-16, no polyps, next 7 years per report Dr Deatra Ina.  --Tobacco abuse: Smoking 3/4 ppd, not ready to quit, counseled --Diet and exercise discussed --Labs : CMP, FLP, CBC, TSH

## 2017-05-25 NOTE — Telephone Encounter (Signed)
PA initiated via Covermymeds; KEY: [id number]. Automatic response informed that medication is covered by plan w/o PA.   CaseId:[id number];Status:Cancelled;Explanation:PA not Required.Medication is Covered;

## 2017-05-25 NOTE — Progress Notes (Signed)
Subjective:    Patient ID: Erin Fox, female    DOB: [birth year], 57 y.o.   MRN: [id number]  DOS:  05/25/2017 Type of visit - description : cpx Interval history: Here for CPX, has few concerns. Since last year, complained of mild cough and chest congestion including occasional wheezing.  Symptoms of started after episode of bronchitis.  No history of asthma. She still smokes.  Also reports some weight gain, states that "a lot of that is going on my abdomen". Also reports a lump at the right side of the abdomen, she found it about 4 months ago.  Review of Systems  Other than above, a 14 point review of systems is negative    Past Medical History:  Diagnosis Date  . Anxiety   . Cyst on ear    Left ear  . Diverticulosis of colon   . Erythematous bladder mucosa   . Hematuria    w/u neg ~ 01-2014, 03-2014  . History of colon polyps   . HSV (herpes simplex virus) infection   . Hyperlipidemia   . Insomnia   . Nephrolithiasis    NON-OBSTRUCTIVE  . OAB (overactive bladder)   . Wears glasses     Past Surgical History:  Procedure Laterality Date  . 5th toes resected bilateral  1990's   bone's removed  . ABDOMINAL HYSTERECTOMY  1984   W/ UNILATERAL SALPINGOOPHORECTOMY  . ABDOMINOPLASTY  1990  . BREAST BIOPSY Right 01-02-2004  . CESAREAN SECTION    . COLONOSCOPY W/ POLYPECTOMY  last one 02-14-2014  . CYSTO/  RETROGRADE PYELOGRAM/  LEFT URETEROSCOPY/  BLADDER BIOPSY  08-27-2004  . CYSTOSCOPY W/ RETROGRADES Bilateral 03/17/2014   Procedure: CYSTOSCOPY WITH RETROGRADE PYELOGRAM;  Surgeon: Alexis Frock, MD;  Location: [facility];  Service: Urology;  Laterality: Bilateral;  . CYSTOSCOPY WITH BIOPSY N/A 03/17/2014   Procedure: CYSTOSCOPY WITH BIOPSY AND FULGERATION;  Surgeon: Alexis Frock, MD;  Location: [facility];  Service: Urology;  Laterality: N/A;  . OTHER SURGICAL HISTORY Left 2016   Cyst Removal on Left ear  . REMOVAL BARTHOLIN CYST/  GLAND AND REVISION LEFT LABIA  06-10-2004    Social History   Socioeconomic History  . Marital status: Divorced    Spouse name: Not on file  . Number of children: 1  . Years of education: Not on file  . Highest education level: Not on file  Occupational History  . Occupation: Medical illustrator: [employer]  Social Needs  . Financial resource strain: Not on file  . Food insecurity:    Worry: Not on file    Inability: Not on file  . Transportation needs:    Medical: Not on file    Non-medical: Not on file  Tobacco Use  . Smoking status: Current Every Day Smoker    Packs/day: 0.50    Years: 30.00    Pack years: 15.00    Types: Cigarettes  . Smokeless tobacco: Never Used  . Tobacco comment: 3/4  ppd  Substance and Sexual Activity  . Alcohol use: Yes    Alcohol/week: 0.0 oz    Comment: OCCASIONAL  . Drug use: Yes    Types: Marijuana    Comment: "every other weekend" per pt.  . Sexual activity: Not on file  Lifestyle  . Physical activity:    Days per week: Not on file    Minutes per session: Not on file  . Stress: Not on file  Relationships  .  Social connections:    Talks on phone: Not on file    Gets together: Not on file    Attends religious service: Not on file    Active member of club or organization: Not on file    Attends meetings of clubs or organizations: Not on file    Relationship status: Not on file  . Intimate partner violence:    Fear of current or ex partner: Not on file    Emotionally abused: Not on file    Physically abused: Not on file    Forced sexual activity: Not on file  Other Topics Concern  . Not on file  Social History Narrative   Her mother lives w/ her         Family History  Problem Relation Age of Onset  . Hyperlipidemia Mother   . Hypertension Mother        M and sister   . Diabetes Father   . Colon polyps Sister 77       8 polyps removed  . High blood pressure Sister   . Cancer Paternal Aunt         lung  . Colon cancer Other        uncle , dx at age 72s  . Colon cancer Paternal Uncle   . Breast cancer Neg Hx      Allergies as of 05/25/2017      Reactions   Latex Rash      Medication List        Accurate as of 05/25/17  9:36 PM. Always use your most recent med list.          acyclovir 400 MG tablet Commonly known as:  ZOVIRAX Take 1 tablet (400 mg total) by mouth 2 (two) times daily as needed.   albuterol 108 (90 Base) MCG/ACT inhaler Commonly known as:  PROVENTIL HFA;VENTOLIN HFA Inhale 2 puffs into the lungs every 6 (six) hours as needed for wheezing or shortness of breath.   amLODipine 5 MG tablet Commonly known as:  NORVASC Take 1 tablet (5 mg total) by mouth daily.   budesonide-formoterol 160-4.5 MCG/ACT inhaler Commonly known as:  SYMBICORT Inhale 2 puffs into the lungs 2 (two) times daily.   carvedilol 12.5 MG tablet Commonly known as:  COREG Take 1 tablet (12.5 mg total) 2 (two) times daily with a meal by mouth.   fluticasone 50 MCG/ACT nasal spray Commonly known as:  FLONASE Place 2 sprays into both nostrils daily.   simvastatin 20 MG tablet Commonly known as:  ZOCOR Take 1 tablet (20 mg total) by mouth at bedtime.          Objective:   Physical Exam  Abdominal:     BP 126/74 (BP Location: Left Arm, Patient Position: Sitting, Cuff Size: Small)   Pulse 64   Temp 98.2 F (36.8 C) (Oral)   Resp 14   Ht 5\' 7"  (1.702 m)   Wt 163 lb 8 oz (74.2 kg)   SpO2 96%   BMI 25.61 kg/m  General:   Well developed, well nourished . NAD.  Neck: No  thyromegaly  HEENT:  Normocephalic . Face symmetric, atraumatic Breast exam: Declined Lungs:  Few rhonchi, increased expiratory time, very few end expiratory wheezes. Normal respiratory effort, no intercostal retractions, no accessory muscle use. Heart: RRR,  no murmur.  No pretibial edema bilaterally Abdomen:  Not distended, soft, non-tender.  Palpable nontender aorta left from the umbilicus.  No  bruit.  See  graphic Skin: Exposed areas without rash. Not pale. Not jaundice Neurologic:  alert & oriented X3.  Speech normal, gait appropriate for age and unassisted Strength symmetric and appropriate for age.  Psych: Cognition and judgment appear intact.  Cooperative with normal attention span and concentration.  Behavior appropriate. No anxious or depressed appearing.     Assessment & Plan:   Assessment HTN: Onset 07-2016 Anxiety, insomnia Hyperlipidemia HSV L leg edema,Venous insufficiency, since the 80s after she had her child. Over active bladder, s/p cysto   >1, 2011, 2016, etc, see urology notes  Non-obstructive nephrolithiasis  Plan: HTN: Seems controlled.  Continue amlodipine, carvedilol. Hyperlipidemia: On simvastatin, checking labs Bronchospasm: Some wheezing after a episode of bronchitis several months ago, she probably has reactive airway disease.  No history of previous asthma. For completeness recommend that CXR, start Symbicort twice a day, Ventolin as needed.  Recheck in 3 months. Lipoma?  Found a "lump" on the abdomen few months ago, on exam is very likely she has a lipoma, we discussed further eval as the only way to be certain about the diagnosis (CT, MRI?) or a surgical referral but at the end we agreed on observation.  Will reassess in few months, will call if area  grows. RTC 2 months to reassess bronchospasm

## 2017-05-25 NOTE — Assessment & Plan Note (Signed)
HTN: Seems controlled.  Continue amlodipine, carvedilol. Hyperlipidemia: On simvastatin, checking labs Bronchospasm: Some wheezing after a episode of bronchitis several months ago, she probably has reactive airway disease.  No history of previous asthma. For completeness recommend that CXR, start Symbicort twice a day, Ventolin as needed.  Recheck in 3 months. Lipoma?  Found a "lump" on the abdomen few months ago, on exam is very likely she has a lipoma, we discussed further eval as the only way to be certain about the diagnosis (CT, MRI?) or a surgical referral but at the end we agreed on observation.  Will reassess in few months, will call if area  grows. RTC 2 months to reassess bronchospasm

## 2017-05-25 NOTE — Progress Notes (Signed)
Pre visit review using our clinic review tool, if applicable. No additional management support is needed unless otherwise documented below in the visit note. 

## 2017-05-25 NOTE — Patient Instructions (Addendum)
GO TO THE LAB : Get the blood work     GO TO THE FRONT DESK Schedule your next appointment for a checkup in 2 months   STOP BY THE FIRST FLOOR:  get the XR   For wheezing: Symbicort twice a day Albuterol as a rescue inhaler, only if severe cough and wheezing. Mucinex DM as needed for cough

## 2017-06-15 ENCOUNTER — Other Ambulatory Visit: Payer: Self-pay | Admitting: Internal Medicine

## 2017-06-16 ENCOUNTER — Other Ambulatory Visit: Payer: Self-pay | Admitting: Internal Medicine

## 2017-07-27 ENCOUNTER — Encounter: Payer: Self-pay | Admitting: Internal Medicine

## 2017-07-27 ENCOUNTER — Ambulatory Visit (INDEPENDENT_AMBULATORY_CARE_PROVIDER_SITE_OTHER): Payer: BLUE CROSS/BLUE SHIELD | Admitting: Internal Medicine

## 2017-07-27 VITALS — BP 126/68 | HR 68 | Temp 98.0°F | Resp 16 | Ht 67.0 in | Wt 161.4 lb

## 2017-07-27 DIAGNOSIS — R062 Wheezing: Secondary | ICD-10-CM

## 2017-07-27 DIAGNOSIS — I10 Essential (primary) hypertension: Secondary | ICD-10-CM | POA: Diagnosis not present

## 2017-07-27 DIAGNOSIS — E785 Hyperlipidemia, unspecified: Secondary | ICD-10-CM | POA: Diagnosis not present

## 2017-07-27 DIAGNOSIS — R222 Localized swelling, mass and lump, trunk: Secondary | ICD-10-CM | POA: Diagnosis not present

## 2017-07-27 NOTE — Progress Notes (Signed)
Subjective:    Patient ID: Erin Fox, female    DOB: 1960-04-19, 57 y.o.   MRN: [id number]  DOS:  07/27/2017 Type of visit - description : f/u Interval history: Follow-up from previous visit Abdominal lump: Unchanged, no pain, more noticeable with Valsalva maneuver. She is trying to lose weight, has changed her diet. Asthma: Well-controlled, is taking only 2 puffs of Symbicort daily, has not use her rescue inhaler.  Wt Readings from Last 3 Encounters:  07/27/17 161 lb 6 oz (73.2 kg)  05/25/17 163 lb 8 oz (74.2 kg)  11/20/16 148 lb 4 oz (67.2 kg)    Review of Systems Denies chest pain or difficulty breathing No anxiety or depression  Past Medical History:  Diagnosis Date  . Anxiety   . Cyst on ear    Left ear  . Diverticulosis of colon   . Erythematous bladder mucosa   . Hematuria    w/u neg ~ 01-2014, 03-2014  . History of colon polyps   . HSV (herpes simplex virus) infection   . Hyperlipidemia   . Insomnia   . Nephrolithiasis    NON-OBSTRUCTIVE  . OAB (overactive bladder)   . Wears glasses     Past Surgical History:  Procedure Laterality Date  . 5th toes resected bilateral  1990's   bone's removed  . ABDOMINAL HYSTERECTOMY  1984   W/ UNILATERAL SALPINGOOPHORECTOMY  . ABDOMINOPLASTY  1990  . BREAST BIOPSY Right 01-02-2004  . CESAREAN SECTION    . COLONOSCOPY W/ POLYPECTOMY  last one 02-14-2014  . CYSTO/  RETROGRADE PYELOGRAM/  LEFT URETEROSCOPY/  BLADDER BIOPSY  08-27-2004  . CYSTOSCOPY W/ RETROGRADES Bilateral 03/17/2014   Procedure: CYSTOSCOPY WITH RETROGRADE PYELOGRAM;  Surgeon: Alexis Frock, MD;  Location: [facility];  Service: Urology;  Laterality: Bilateral;  . CYSTOSCOPY WITH BIOPSY N/A 03/17/2014   Procedure: CYSTOSCOPY WITH BIOPSY AND FULGERATION;  Surgeon: Alexis Frock, MD;  Location: [facility];  Service: Urology;  Laterality: N/A;  . OTHER SURGICAL HISTORY Left 2016   Cyst Removal on Left ear  . REMOVAL  BARTHOLIN CYST/ GLAND AND REVISION LEFT LABIA  06-10-2004    Social History   Socioeconomic History  . Marital status: Divorced    Spouse name: Not on file  . Number of children: 1  . Years of education: Not on file  . Highest education level: Not on file  Occupational History  . Occupation: Medical illustrator: [employer]  Social Needs  . Financial resource strain: Not on file  . Food insecurity:    Worry: Not on file    Inability: Not on file  . Transportation needs:    Medical: Not on file    Non-medical: Not on file  Tobacco Use  . Smoking status: Current Every Day Smoker    Packs/day: 0.50    Years: 30.00    Pack years: 15.00    Types: Cigarettes  . Smokeless tobacco: Never Used  . Tobacco comment: 3/4  ppd  Substance and Sexual Activity  . Alcohol use: Yes    Alcohol/week: 0.0 oz    Comment: OCCASIONAL  . Drug use: Yes    Types: Marijuana    Comment: "every other weekend" per pt.  . Sexual activity: Not on file  Lifestyle  . Physical activity:    Days per week: Not on file    Minutes per session: Not on file  . Stress: Not on file  Relationships  .  Social connections:    Talks on phone: Not on file    Gets together: Not on file    Attends religious service: Not on file    Active member of club or organization: Not on file    Attends meetings of clubs or organizations: Not on file    Relationship status: Not on file  . Intimate partner violence:    Fear of current or ex partner: Not on file    Emotionally abused: Not on file    Physically abused: Not on file    Forced sexual activity: Not on file  Other Topics Concern  . Not on file  Social History Narrative   Her mother lives w/ her          Allergies as of 07/27/2017      Reactions   Latex Rash      Medication List        Accurate as of 07/27/17 11:59 PM. Always use your most recent med list.          acyclovir 400 MG tablet Commonly known as:  ZOVIRAX Take 1  tablet (400 mg total) by mouth 2 (two) times daily as needed.   albuterol 108 (90 Base) MCG/ACT inhaler Commonly known as:  PROVENTIL HFA;VENTOLIN HFA Inhale 2 puffs into the lungs every 6 (six) hours as needed for wheezing or shortness of breath.   amLODipine 5 MG tablet Commonly known as:  NORVASC Take 1 tablet (5 mg total) by mouth daily.   budesonide-formoterol 160-4.5 MCG/ACT inhaler Commonly known as:  SYMBICORT Inhale 2 puffs into the lungs 2 (two) times daily.   carvedilol 12.5 MG tablet Commonly known as:  COREG Take 1 tablet (12.5 mg total) by mouth 2 (two) times daily with a meal.   fluticasone 50 MCG/ACT nasal spray Commonly known as:  FLONASE Place 2 sprays into both nostrils daily.   simvastatin 20 MG tablet Commonly known as:  ZOCOR Take 1 tablet (20 mg total) by mouth at bedtime.          Objective:   Physical Exam  Abdominal:     BP 126/68 (BP Location: Left Arm, Patient Position: Sitting, Cuff Size: Small)   Pulse 68   Temp 98 F (36.7 C) (Oral)   Resp 16   Ht 5\' 7"  (1.702 m)   Wt 161 lb 6 oz (73.2 kg)   SpO2 96%   BMI 25.27 kg/m  General:   Well developed, NAD, see BMI.  HEENT:  Normocephalic . Face symmetric, atraumatic Lungs:  CTA B Normal respiratory effort, no intercostal retractions, no accessory muscle use. Heart: RRR,  no murmur.  No pretibial edema bilaterally  Skin: Not pale. Not jaundice Neurologic:  alert & oriented X3.  Speech normal, gait appropriate for age and unassisted Psych--  Cognition and judgment appear intact.  Cooperative with normal attention span and concentration.  Behavior appropriate. No anxious or depressed appearing.      Assessment & Plan:    Assessment HTN: Onset 07-2016 Anxiety, insomnia Hyperlipidemia HSV L leg edema,Venous insufficiency, since the 80s after she had her child. Over active bladder, s/p cysto   >1, 2011, 2016, etc, see urology notes  Non-obstructive  nephrolithiasis  Plan: HTN: Controlled, last BMP satisfactory, on amlodipine and carvedilol Hyperlipidemia: On simvastatin, last FLP satisfactory Bronchospasm: Well-controlled, okay to decrease Symbicort to 1 puff twice a day, go back to 2 puff BID when needed. Abdominal wall mass, on today's exam seems to be reducible,  probably a small hernia.  We had again talk about a referral for further eval but she declined.  She will let me know if the area gets bigger or very hard and painful. RTC CPX 05-2018

## 2017-07-27 NOTE — Patient Instructions (Addendum)
GO TO THE FRONT DESK Schedule your next appointment for a  Physical exam, fasting, 05/2018  Check the  blood pressure  monthly   Be sure your blood pressure is between 110/65 and  135/85. If it is consistently higher or lower, let me know

## 2017-07-27 NOTE — Progress Notes (Signed)
Pre visit review using our clinic review tool, if applicable. No additional management support is needed unless otherwise documented below in the visit note. 

## 2017-07-28 DIAGNOSIS — J45909 Unspecified asthma, uncomplicated: Secondary | ICD-10-CM | POA: Insufficient documentation

## 2017-07-28 NOTE — Assessment & Plan Note (Signed)
HTN: Controlled, last BMP satisfactory, on amlodipine and carvedilol Hyperlipidemia: On simvastatin, last FLP satisfactory Bronchospasm: Well-controlled, okay to decrease Symbicort to 1 puff twice a day, go back to 2 puff BID when needed. Abdominal wall mass, on today's exam seems to be reducible,  probably a small hernia.  We had again talk about a referral for further eval but she declined.  She will let me know if the area gets bigger or very hard and painful. RTC CPX 05-2018

## 2017-09-19 DIAGNOSIS — B9689 Other specified bacterial agents as the cause of diseases classified elsewhere: Secondary | ICD-10-CM | POA: Diagnosis not present

## 2017-09-19 DIAGNOSIS — J208 Acute bronchitis due to other specified organisms: Secondary | ICD-10-CM | POA: Diagnosis not present

## 2017-09-22 ENCOUNTER — Ambulatory Visit: Payer: BLUE CROSS/BLUE SHIELD | Admitting: Gastroenterology

## 2017-10-02 ENCOUNTER — Other Ambulatory Visit: Payer: Self-pay | Admitting: Internal Medicine

## 2017-12-16 DIAGNOSIS — R29898 Other symptoms and signs involving the musculoskeletal system: Secondary | ICD-10-CM | POA: Diagnosis not present

## 2017-12-16 DIAGNOSIS — M5136 Other intervertebral disc degeneration, lumbar region: Secondary | ICD-10-CM | POA: Diagnosis not present

## 2017-12-16 DIAGNOSIS — M5116 Intervertebral disc disorders with radiculopathy, lumbar region: Secondary | ICD-10-CM | POA: Diagnosis not present

## 2017-12-16 DIAGNOSIS — M5416 Radiculopathy, lumbar region: Secondary | ICD-10-CM | POA: Diagnosis not present

## 2017-12-17 ENCOUNTER — Other Ambulatory Visit: Payer: Self-pay | Admitting: Internal Medicine

## 2017-12-17 DIAGNOSIS — R29898 Other symptoms and signs involving the musculoskeletal system: Secondary | ICD-10-CM | POA: Diagnosis not present

## 2017-12-17 DIAGNOSIS — M4726 Other spondylosis with radiculopathy, lumbar region: Secondary | ICD-10-CM | POA: Diagnosis not present

## 2017-12-17 DIAGNOSIS — M5416 Radiculopathy, lumbar region: Secondary | ICD-10-CM | POA: Diagnosis not present

## 2017-12-17 DIAGNOSIS — M5126 Other intervertebral disc displacement, lumbar region: Secondary | ICD-10-CM | POA: Diagnosis not present

## 2017-12-25 ENCOUNTER — Ambulatory Visit (INDEPENDENT_AMBULATORY_CARE_PROVIDER_SITE_OTHER): Payer: BLUE CROSS/BLUE SHIELD | Admitting: Internal Medicine

## 2017-12-25 ENCOUNTER — Encounter: Payer: Self-pay | Admitting: Internal Medicine

## 2017-12-25 VITALS — BP 124/78 | HR 68 | Temp 98.1°F | Resp 16 | Ht 67.0 in | Wt 160.4 lb

## 2017-12-25 DIAGNOSIS — J4520 Mild intermittent asthma, uncomplicated: Secondary | ICD-10-CM

## 2017-12-25 DIAGNOSIS — R29898 Other symptoms and signs involving the musculoskeletal system: Secondary | ICD-10-CM | POA: Diagnosis not present

## 2017-12-25 NOTE — Progress Notes (Signed)
Pre visit review using our clinic review tool, if applicable. No additional management support is needed unless otherwise documented below in the visit note. 

## 2017-12-25 NOTE — Patient Instructions (Signed)
Will refer you to neurology

## 2017-12-25 NOTE — Progress Notes (Signed)
Subjective:    Patient ID: Erin Fox, female    DOB: May 22, 1960, 57 y.o.   MRN: [id number]  DOS:  12/25/2017 Type of visit - description : f/u Interval history: Was seen at outside clinic 12/16/2017, records reviewed. Had pain at the left buttock, left leg. Onset 3 days prior to visit She also report loss of the left foot strength. She was recommended Toradol, prednisone, got Solu-Medrol injection. She also had an MRI. XR  showed DJD MRI: 1. 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 2. Notable facet degeneration at L3-4 and L4-5. 3. No impingement to explain left leg symptoms.  Review of Systems Since she was seen at the other office, she is taking the medications as recommended. The pain is much improved for the last 24 hours. She still has left leg weakness. No recent fall or injury. No fever chills, no rash. No bladder or bowel incontinence No headache, nausea, diplopia or visual disturbances.  Past Medical History:  Diagnosis Date  . Anxiety   . Cyst on ear    Left ear  . Diverticulosis of colon   . Erythematous bladder mucosa   . Hematuria    w/u neg ~ 01-2014, 03-2014  . History of colon polyps   . HSV (herpes simplex virus) infection   . Hyperlipidemia   . Insomnia   . Nephrolithiasis    NON-OBSTRUCTIVE  . OAB (overactive bladder)   . Wears glasses     Past Surgical History:  Procedure Laterality Date  . 5th toes resected bilateral  1990's   bone's removed  . ABDOMINAL HYSTERECTOMY  1984   W/ UNILATERAL SALPINGOOPHORECTOMY  . ABDOMINOPLASTY  1990  . BREAST BIOPSY Right 01-02-2004  . CESAREAN SECTION    . COLONOSCOPY W/ POLYPECTOMY  last one 02-14-2014  . CYSTO/  RETROGRADE PYELOGRAM/  LEFT URETEROSCOPY/  BLADDER BIOPSY  08-27-2004  . CYSTOSCOPY W/ RETROGRADES Bilateral 03/17/2014   Procedure: CYSTOSCOPY WITH RETROGRADE PYELOGRAM;  Surgeon: Alexis Frock, MD;  [facility];  Service: Urology;  Laterality:  Bilateral;  . CYSTOSCOPY WITH BIOPSY N/A 03/17/2014   Procedure: CYSTOSCOPY WITH BIOPSY AND FULGERATION;  Surgeon: Alexis Frock, MD;  Location: [facility];  Service: Urology;  Laterality: N/A;  . OTHER SURGICAL HISTORY Left 2016   Cyst Removal on Left ear  . REMOVAL BARTHOLIN CYST/ GLAND AND REVISION LEFT LABIA  06-10-2004    Social History   Socioeconomic History  . Marital status: Divorced    Spouse name: Not on file  . Number of children: 1  . Years of education: Not on file  . Highest education level: Not on file  Occupational History  . Occupation: Medical illustrator: [employer]  Social Needs  . Financial resource strain: Not on file  . Food insecurity:    Worry: Not on file    Inability: Not on file  . Transportation needs:    Medical: Not on file    Non-medical: Not on file  Tobacco Use  . Smoking status: Current Every Day Smoker    Packs/day: 0.50    Years: 30.00    Pack years: 15.00    Types: Cigarettes  . Smokeless tobacco: Never Used  . Tobacco comment: 3/4  ppd  Substance and Sexual Activity  . Alcohol use: Yes    Alcohol/week: 0.0 standard drinks    Comment: OCCASIONAL  . Drug use: Yes    Types: Marijuana    Comment: "every  other weekend" per pt.  . Sexual activity: Not on file  Lifestyle  . Physical activity:    Days per week: Not on file    Minutes per session: Not on file  . Stress: Not on file  Relationships  . Social connections:    Talks on phone: Not on file    Gets together: Not on file    Attends religious service: Not on file    Active member of club or organization: Not on file    Attends meetings of clubs or organizations: Not on file    Relationship status: Not on file  . Intimate partner violence:    Fear of current or ex partner: Not on file    Emotionally abused: Not on file    Physically abused: Not on file    Forced sexual activity: Not on file  Other Topics Concern  . Not on file  Social  History Narrative   Her mother lives w/ her          Allergies as of 12/25/2017      Reactions   Latex Rash      Medication List        Accurate as of 12/25/17 11:59 PM. Always use your most recent med list.          acyclovir 400 MG tablet Commonly known as:  ZOVIRAX Take 1 tablet (400 mg total) by mouth 2 (two) times daily as needed.   albuterol 108 (90 Base) MCG/ACT inhaler Commonly known as:  PROVENTIL HFA;VENTOLIN HFA Inhale 2 puffs into the lungs every 6 (six) hours as needed for wheezing or shortness of breath.   amLODipine 5 MG tablet Commonly known as:  NORVASC TAKE 1 TABLET BY MOUTH ONCE DAILY   budesonide-formoterol 160-4.5 MCG/ACT inhaler Commonly known as:  SYMBICORT Inhale 2 puffs into the lungs 2 (two) times daily.   carvedilol 12.5 MG tablet Commonly known as:  COREG Take 1 tablet (12.5 mg total) by mouth 2 (two) times daily with a meal.   cyclobenzaprine 5 MG tablet Commonly known as:  FLEXERIL Take 5-10 mg by mouth every 8 (eight) hours as needed for muscle spasms.   fluticasone 50 MCG/ACT nasal spray Commonly known as:  FLONASE Place 2 sprays into both nostrils daily.   predniSONE 20 MG tablet Commonly known as:  DELTASONE As directed   simvastatin 20 MG tablet Commonly known as:  ZOCOR Take 1 tablet (20 mg total) by mouth at bedtime.          Objective:   Physical Exam BP 124/78 (BP Location: Left Arm, Patient Position: Sitting, Cuff Size: Small)   Pulse 68   Temp 98.1 F (36.7 C) (Oral)   Resp 16   Ht 5\' 7"  (1.702 m)   Wt 160 lb 6 oz (72.7 kg)   SpO2 96%   BMI 25.12 kg/m  General:   Well developed, NAD, BMI noted. HEENT:  Normocephalic . Face symmetric, atraumatic MSK: No TTP at the thoracolumbar spine or SIs. Skin: Not pale. Not jaundice Neurologic:  alert & oriented X3.  Speech normal, gait unassisted but with some difficulty due to left leg weakness Motor: Right leg normal Left leg: Distal more than proximal  weakness particularly w/ dorsiflexion of the left foot. DTRs:  Upper extremities normal Right leg: Normal knee jerk, absent ankle jerk Left leg absent knee and ankle jerk absent Psych--  Cognition and judgment appear intact.  Cooperative with normal attention span and concentration.  Behavior  appropriate. No anxious or depressed appearing.      Assessment & Plan:   Assessment HTN: Onset 07-2016 Anxiety, insomnia Hyperlipidemia Reacive airway DZ dx 2019 HSV L leg edema,Venous insufficiency, since the 80s after she had her child. Over active bladder, s/p cysto   >1, 2011, 2016, etc, see urology notes  Non-obstructive nephrolithiasis  Plan: Back pain, left leg weakness: As described above, MRI failed to show any obvious impingement. Symptoms are improving with prednisone but she probably needs further eval.  Will refer to neurology. Reactive airway disease: Interestingly, now that she is taking prednisone all her wheezing and cough are gone.  Recommend to go back on Symbicort consistently.  CPX 05-2018 recommended

## 2017-12-27 NOTE — Assessment & Plan Note (Signed)
Back pain, left leg weakness: As described above, MRI failed to show any obvious impingement. Symptoms are improving with prednisone but Erin Fox probably needs further eval.  Will refer to neurology. Reactive airway disease: Interestingly, now that Erin Fox is taking prednisone all her wheezing and cough are gone.  Recommend to go back on Symbicort consistently.  CPX 05-2018 recommended

## 2018-01-01 ENCOUNTER — Other Ambulatory Visit: Payer: Self-pay

## 2018-01-01 ENCOUNTER — Ambulatory Visit (INDEPENDENT_AMBULATORY_CARE_PROVIDER_SITE_OTHER): Payer: BLUE CROSS/BLUE SHIELD | Admitting: Neurology

## 2018-01-01 ENCOUNTER — Encounter: Payer: Self-pay | Admitting: Neurology

## 2018-01-01 VITALS — BP 118/77 | HR 62 | Resp 18 | Ht 67.0 in | Wt 153.0 lb

## 2018-01-01 DIAGNOSIS — M5432 Sciatica, left side: Secondary | ICD-10-CM | POA: Diagnosis not present

## 2018-01-01 MED ORDER — GABAPENTIN 100 MG PO CAPS: 100.0000 mg | ORAL_CAPSULE | Freq: Three times a day (TID) | ORAL | 3 refills | Status: DC

## 2018-01-01 NOTE — Progress Notes (Signed)
Reason for visit: Left-sided sciatica  Referring physician: Dr. Marlowe Sax is a 57 y.o. female  History of present illness:  Ms. Levit is a 57 year old right-handed black female with a history of spontaneous onset of left leg pain that began about 2 weeks ago.  The patient was at home, not very active, she was cooking.  She began feeling a pulling sensation in the left thigh, this gradually worsened to the point where she was having severe pain starting in the left lower back going down the leg into the calf area.  She started feeling weak in the leg, she was having difficulty lifting the leg and having difficulty pulling the foot up and pushing the foot down.  The patient denies any numbness per se, but the patient was weak with the left leg.  The patient has been placed on prednisone which has helped the discomfort and improved the strength.  The patient underwent MRI of the lumbar spine that did not show definite evidence of a nerve root impingement.  The patient still has discomfort but it is much improved.  She has not had any falls, she denies any symptoms with the right leg, she denies issues controlling the bowels or the bladder.  She does not have any problems with the neck or arms.  She is sent to this office for an evaluation.  Past Medical History:  Diagnosis Date  . Anxiety   . Cyst on ear    Left ear  . Diverticulosis of colon   . Erythematous bladder mucosa   . Hematuria    w/u neg ~ 01-2014, 03-2014  . History of colon polyps   . HSV (herpes simplex virus) infection   . Hyperlipidemia   . Hypertension   . Insomnia   . Nephrolithiasis    NON-OBSTRUCTIVE  . OAB (overactive bladder)   . Wears glasses     Past Surgical History:  Procedure Laterality Date  . 5th toes resected bilateral  1990's   bone's removed  . ABDOMINAL HYSTERECTOMY  1984   W/ UNILATERAL SALPINGOOPHORECTOMY  . ABDOMINOPLASTY  1990  . BREAST BIOPSY Right 01-02-2004  . CESAREAN SECTION     . COLONOSCOPY W/ POLYPECTOMY  last one 02-14-2014  . CYSTO/  RETROGRADE PYELOGRAM/  LEFT URETEROSCOPY/  BLADDER BIOPSY  08-27-2004  . CYSTOSCOPY W/ RETROGRADES Bilateral 03/17/2014   Procedure: CYSTOSCOPY WITH RETROGRADE PYELOGRAM;  Surgeon: Alexis Frock, MD;  Location[facility];  Service: Urology;  Laterality: Bilateral;  . CYSTOSCOPY WITH BIOPSY N/A 03/17/2014   Procedure: CYSTOSCOPY WITH BIOPSY AND FULGERATION;  Surgeon: Alexis Frock, MD;  Location: [facility];  Service: Urology;  Laterality: N/A;  . OTHER SURGICAL HISTORY Left 2016   Cyst Removal on Left ear  . REMOVAL BARTHOLIN CYST/ GLAND AND REVISION LEFT LABIA  06-10-2004    Family History  Problem Relation Age of Onset  . Hyperlipidemia Mother   . Hypertension Mother        M and sister   . Diabetes Father   . Colon polyps Sister 43       8 polyps removed  . High blood pressure Sister   . Cancer Paternal Aunt        lung  . Colon cancer Other        uncle , dx at age 13s  . Colon cancer Paternal Uncle   . Breast cancer Neg Hx     Social history:  reports  that she has been smoking cigarettes. She has a 15.00 pack-year smoking history. She has never used smokeless tobacco. She reports that she drinks alcohol. She reports that she has current or past drug history. Drug: Marijuana.  Medications:  Prior to Admission medications   Medication Sig Start Date End Date Taking? Authorizing Provider  acyclovir (ZOVIRAX) 400 MG tablet Take 1 tablet (400 mg total) by mouth 2 (two) times daily as needed. 11/20/16  Yes Paz, Alda Berthold, MD  albuterol (PROVENTIL HFA;VENTOLIN HFA) 108 (90 Base) MCG/ACT inhaler Inhale 2 puffs into the lungs every 6 (six) hours as needed for wheezing or shortness of breath. 05/25/17  Yes Paz, Alda Berthold, MD  amLODipine (NORVASC) 5 MG tablet TAKE 1 TABLET BY MOUTH ONCE DAILY 10/03/17  Yes Colon Branch, MD  budesonide-formoterol [facility]) 160-4.5 MCG/ACT inhaler Inhale 2 puffs into  the lungs 2 (two) times daily. 05/25/17  Yes Colon Branch, MD  carvedilol (COREG) 12.5 MG tablet Take 1 tablet (12.5 mg total) by mouth 2 (two) times daily with a meal. 12/17/17  Yes Paz, Alda Berthold, MD  cyclobenzaprine (FLEXERIL) 5 MG tablet Take 5-10 mg by mouth every 8 (eight) hours as needed for muscle spasms.    Yes [provider]  fluticasone (FLONASE) 50 MCG/ACT nasal spray Place 2 sprays into both nostrils daily. 11/17/16  Yes Paz, Alda Berthold, MD  predniSONE (DELTASONE) 20 MG tablet As directed   Yes [provider]  simvastatin (ZOCOR) 20 MG tablet Take 1 tablet (20 mg total) by mouth at bedtime. 06/16/17  Yes Colon Branch, MD      Allergies  Allergen Reactions  . Latex Rash    ROS:  Out of a complete 14 system review of symptoms, the patient complains only of the following symptoms, and all other reviewed systems are negative.  Leg pain  Blood pressure 118/77, pulse 62, resp. rate 18, height 5\' 7"  (1.702 m), weight 153 lb (69.4 kg).  Physical Exam  General: The patient is alert and cooperative at the time of the examination.  Eyes: Pupils are equal, round, and reactive to light. Discs are flat bilaterally.  Neck: The neck is supple, no carotid bruits are noted.  Respiratory: The respiratory examination is clear.  Cardiovascular: The cardiovascular examination reveals a regular rate and rhythm, no obvious murmurs or rubs are noted.  Skin: Extremities are without significant edema.  Neurologic Exam  Mental status: The patient is alert and oriented x 3 at the time of the examination. The patient has apparent normal recent and remote memory, with an apparently normal attention span and concentration ability.  Cranial nerves: Facial symmetry is present. There is good sensation of the face to pinprick and soft touch bilaterally. The strength of the facial muscles and the muscles to head turning and shoulder shrug are normal bilaterally. Speech is well enunciated, no  aphasia or dysarthria is noted. Extraocular movements are full. Visual fields are full. The tongue is midline, and the patient has symmetric elevation of the soft palate. No obvious hearing deficits are noted.  Motor: The motor testing reveals 5 over 5 strength of all 4 extremities, with exception of a giveaway type weakness with hip flexion, and knee extension and flexion. Good symmetric motor tone is noted throughout.  Sensory: Sensory testing is intact to pinprick, soft touch, vibration sensation, and position sense on all 4 extremities. No evidence of extinction is noted.  Coordination: Cerebellar testing reveals good finger-nose-finger and heel-to-shin bilaterally.  Gait  and station: Gait is associated with a limping type gait on the left leg. Tandem gait is unsteady. Romberg is negative. No drift is seen.  The patient is able to walk on the toes bilaterally but she is not able to walk on the heel on the left foot, can perform on the right.  Reflexes: Deep tendon reflexes are symmetric, but are depressed bilaterally. Toes are downgoing bilaterally.   MRI lumbar 12/17/17:  IMPRESSION: 1. 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 2. Notable facet degeneration at L3-4 and L4-5. 3. No impingement to explain left leg symptoms.   Assessment/Plan:  1.  Left sided sciatica  The patient has had spontaneous onset of left leg pain.  MRI of the lumbar spine does not show an etiology for this.  The patient appears to have mainly giveaway weakness with the left leg, and diffuse reduction of deep tendon reflexes.  The patient will be placed on gabapentin taking 100 mg 3 times daily, she will be set up for nerve conduction studies on both legs and had EMG on the left leg.  The patient may continue to take Aleve or Advil on a regular basis.  She will follow-up for the EMG.  Jill Alexanders MD 01/01/2018 11:33 AM  [facility] [address] [place], [place] [address]  Phone [phone] Fax [phone]

## 2018-01-01 NOTE — Patient Instructions (Signed)
We will start gabapentin 100 mg three times a day for the leg pain, call for any dose adjustments.   Neurontin (gabapentin) may result in drowsiness, ankle swelling, gait instability, or possibly dizziness. Please contact our office if significant side effects occur with this medication.

## 2018-01-04 ENCOUNTER — Telehealth: Payer: Self-pay | Admitting: Neurology

## 2018-01-04 MED ORDER — GABAPENTIN 100 MG PO CAPS: ORAL_CAPSULE | ORAL | 3 refills | Status: DC

## 2018-01-04 NOTE — Telephone Encounter (Signed)
I called the patient.  The patient is getting some benefit from the gabapentin for discomfort, she will go up on the gabapentin taking 100 mg twice during the day and 3 at night.

## 2018-01-04 NOTE — Telephone Encounter (Signed)
Pt requesting a call to discuss increasing the dosage for gabapentin (NEURONTIN) 100 MG capsule stating the is still in a lot of pain.

## 2018-01-21 ENCOUNTER — Encounter: Payer: Self-pay | Admitting: Gastroenterology

## 2018-01-28 ENCOUNTER — Other Ambulatory Visit: Payer: Self-pay | Admitting: Internal Medicine

## 2018-02-01 DIAGNOSIS — Z6825 Body mass index (BMI) 25.0-25.9, adult: Secondary | ICD-10-CM | POA: Diagnosis not present

## 2018-02-01 DIAGNOSIS — Z01419 Encounter for gynecological examination (general) (routine) without abnormal findings: Secondary | ICD-10-CM | POA: Diagnosis not present

## 2018-02-01 DIAGNOSIS — Z124 Encounter for screening for malignant neoplasm of cervix: Secondary | ICD-10-CM | POA: Diagnosis not present

## 2018-02-01 DIAGNOSIS — N760 Acute vaginitis: Secondary | ICD-10-CM | POA: Diagnosis not present

## 2018-02-15 ENCOUNTER — Other Ambulatory Visit: Payer: Self-pay | Admitting: Internal Medicine

## 2018-02-15 ENCOUNTER — Encounter: Payer: Self-pay | Admitting: Neurology

## 2018-02-15 ENCOUNTER — Ambulatory Visit (INDEPENDENT_AMBULATORY_CARE_PROVIDER_SITE_OTHER): Payer: BLUE CROSS/BLUE SHIELD | Admitting: Neurology

## 2018-02-15 DIAGNOSIS — M5432 Sciatica, left side: Secondary | ICD-10-CM | POA: Diagnosis not present

## 2018-02-15 DIAGNOSIS — G5732 Lesion of lateral popliteal nerve, left lower limb: Secondary | ICD-10-CM | POA: Insufficient documentation

## 2018-02-15 DIAGNOSIS — Z0289 Encounter for other administrative examinations: Secondary | ICD-10-CM

## 2018-02-15 HISTORY — DX: Lesion of lateral popliteal nerve, left lower limb: G57.32

## 2018-02-15 NOTE — Progress Notes (Signed)
Please refer to EMG and nerve conduction procedure note.  

## 2018-02-15 NOTE — Progress Notes (Addendum)
The patient comes in today for EMG nerve conduction study.  Her left leg pain has improved significantly, the patient walks with a slap to the foot on the left when the foot hits the floor.  Nerve conduction studies show evidence of dysfunction of the left peroneal nerve, EMG study shows isolated denervation of the tibialis anterior muscle.  No clear evidence of an overlying lumbosacral radiculopathy.  The patient believes that her leg strength is improving, she does cross her legs frequently, I have indicated that she needs to stop this and let the nerve heal.   [place]    Nerve / Sites Muscle Latency Ref. Amplitude Ref. Rel Amp Segments Distance Velocity Ref. Area    ms ms mV mV %  cm m/s m/s mVms  R Peroneal - EDB     Ankle EDB 5.3 ?6.5 5.0 ?2.0 100 Ankle - EDB 9   17.5     Fib head EDB 12.8  4.8  95.9 Fib head - Ankle 36 48 ?44 16.5     Pop fossa EDB 14.8  4.5  93.3 Pop fossa - Fib head 10 49 ?44 16.4         Pop fossa - Ankle      L Peroneal - EDB     Ankle EDB 5.1 ?6.5 1.7 ?2.0 100 Ankle - EDB 9   8.7     Fib head EDB 13.3  1.7  97 Fib head - Ankle 36 44 ?44 8.2     Pop fossa EDB 15.6  0.9  54.7 Pop fossa - Fib head 10 44 ?44 4.1         Pop fossa - Ankle      R Tibial - AH     Ankle AH 4.3 ?5.8 11.8 ?4.0 100 Ankle - AH 9   24.0     Pop fossa AH 13.3  8.4  71.6 Pop fossa - Ankle 40 45 ?41 24.1  L Tibial - AH     Ankle AH 4.5 ?5.8 10.7 ?4.0 100 Ankle - AH 9   32.1     Pop fossa AH 14.3  7.6  70.5 Pop fossa - Ankle 40 41 ?41 28.0             SNC    Nerve / Sites Rec. Site Peak Lat Ref.  Amp Ref. Segments Distance    ms ms V V  cm  R Sural - Ankle (Calf)     Calf Ankle 3.6 ?4.4 9 ?6 Calf - Ankle 14  L Sural - Ankle (Calf)     Calf Ankle 4.1 ?4.4 5 ?6 Calf - Ankle 14  R Superficial peroneal - Ankle     Lat leg Ankle 4.3 ?4.4 9 ?6 Lat leg - Ankle 14  L Superficial peroneal - Ankle     Lat leg Ankle 4.8 ?4.4 3 ?6 Lat leg - Ankle 14              F  Wave    Nerve F Lat Ref.   ms ms  R Tibial - AH 53.4 ?56.0  L Tibial - AH 55.3 ?56.0

## 2018-02-15 NOTE — Procedures (Signed)
     HISTORY:  Erin Fox is a 58 year old patient with a history of spontaneous onset severe pain in the left leg from the hip down to the ankle area.  The patient has a mild foot drop on the left, she is being evaluated for a possible neuropathy or a radiculopathy.  NERVE CONDUCTION STUDIES:  Nerve conduction studies were performed on both lower extremities.  The distal motor latencies for the peroneal and posterior tibial nerves were within normal limits bilaterally.  The amplitudes for these nerves were normal bilaterally with exception of a low milligrams 2 for the left peroneal nerve.  The nerve conduction velocities for the peroneal and posterior tibial nerves were normal bilaterally.  The sural sensory latencies are normal bilaterally, the right peroneal sensory latency was normal, prolonged on the left.  The F-wave latencies for the posterior tibial nerves were within normal limits bilaterally.  EMG STUDIES:  EMG study was performed on the left lower extremity:  The tibialis anterior muscle reveals 2 to 3K motor units with markedly reduced recruitment. 3+ fibrillations and positive waves were seen. The peroneus tertius muscle reveals 2 to 4K motor units with full recruitment. No fibrillations or positive waves were seen. The medial gastrocnemius muscle reveals 1 to 3K motor units with full recruitment. No fibrillations or positive waves were seen. The vastus lateralis muscle reveals 2 to 4K motor units with full recruitment. No fibrillations or positive waves were seen. The iliopsoas muscle reveals 2 to 4K motor units with full recruitment. No fibrillations or positive waves were seen. The biceps femoris muscle (long head) reveals 2 to 4K motor units with full recruitment. No fibrillations or positive waves were seen. The lumbosacral paraspinal muscles were tested at 3 levels, and revealed no abnormalities of insertional activity at all 3 levels tested. There was good  relaxation.   IMPRESSION:  Nerve conduction studies done on both lower extremities shows evidence of dysfunction of the left peroneal nerve at the knee.  EMG evaluation of the left lower extremity shows isolated acute denervation involving the tibialis anterior muscle with sparing of the peroneus tertius muscle.  The study suggests an incomplete injury to the left peroneal nerve at the knee, no evidence of an overlying lumbosacral radiculopathy was seen.  Jill Alexanders MD 02/15/2018 1:46 PM  [facility] [address] [place], [place] [address]  Phone [phone] Fax [phone]

## 2018-02-16 ENCOUNTER — Encounter: Payer: Self-pay | Admitting: Gastroenterology

## 2018-02-16 ENCOUNTER — Ambulatory Visit (INDEPENDENT_AMBULATORY_CARE_PROVIDER_SITE_OTHER): Payer: BLUE CROSS/BLUE SHIELD | Admitting: Gastroenterology

## 2018-02-16 VITALS — BP 106/74 | HR 72 | Ht 67.0 in | Wt 162.0 lb

## 2018-02-16 DIAGNOSIS — K648 Other hemorrhoids: Secondary | ICD-10-CM

## 2018-02-16 DIAGNOSIS — B354 Tinea corporis: Secondary | ICD-10-CM | POA: Diagnosis not present

## 2018-02-16 NOTE — Progress Notes (Signed)
[facility] Consult Note:  History: Erin Fox 02/16/2018  Referring physician: Self-referred  Reason for consult/chief complaint: Rectal Bleeding (BRB on tissue and in toilet, no blood since November)   Subjective  HPI:  This is a very pleasant 58 year old woman self-referred for a period of painless rectal bleeding.  It occurred a couple months ago while she was taking Goody powders regularly.  She stopped doing that, and the bleeding resolved.  She denies chronic abdominal pain, constipation or diarrhea.  She has lately been having some nerve pain in her legs.  Colonoscopy with Dr. Deatra Ina November 2010 with 13 mm clonic flexure tubular adenoma.  No polyps seen in January 2016; internal hemorrhoids noted.  ROS:  Review of Systems  She denies chest pain dyspnea or dysuria Past Medical History: Past Medical History:  Diagnosis Date  . Anxiety   . Cyst on ear    Left ear  . Diverticulosis of colon   . Erythematous bladder mucosa   . Hematuria    w/u neg ~ 01-2014, 03-2014  . History of colon polyps   . HSV (herpes simplex virus) infection   . Hyperlipidemia   . Hypertension   . Insomnia   . Nephrolithiasis    NON-OBSTRUCTIVE  . OAB (overactive bladder)   . 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 Wears glasses      Past Surgical History: Past Surgical History:  Procedure Laterality Date  . 5th toes resected bilateral  1990's   bone's removed  . ABDOMINAL HYSTERECTOMY  1984   W/ UNILATERAL SALPINGOOPHORECTOMY  . ABDOMINOPLASTY  1990  . BREAST BIOPSY Right 01-02-2004  . CESAREAN SECTION    . COLONOSCOPY W/ POLYPECTOMY  last one 02-14-2014  . CYSTO/  RETROGRADE PYELOGRAM/  LEFT URETEROSCOPY/  BLADDER BIOPSY  08-27-2004  . CYSTOSCOPY W/ RETROGRADES Bilateral 03/17/2014   Procedure: CYSTOSCOPY WITH RETROGRADE PYELOGRAM;  Surgeon: Alexis Frock, MD;  Location: [facility];  Service: Urology;  Laterality:  Bilateral;  . CYSTOSCOPY WITH BIOPSY N/A 03/17/2014   Procedure: CYSTOSCOPY WITH BIOPSY AND FULGERATION;  Surgeon: Alexis Frock, MD;  Location: [facility];  Service: Urology;  Laterality: N/A;  . OTHER SURGICAL HISTORY Left 2016   Cyst Removal on Left ear  . REMOVAL BARTHOLIN CYST/ GLAND AND REVISION LEFT LABIA  06-10-2004     Family History: Family History  Problem Relation Age of Onset  . Hyperlipidemia Mother   . Hypertension Mother        M and sister   . Diabetes Father   . Colon polyps Sister 92       8 polyps removed  . High blood pressure Sister   . Cancer Paternal Aunt        lung  . Colon cancer Other        uncle , dx at age 108s  . Colon cancer Paternal Uncle   . Breast cancer Neg Hx     Social History: Social History   Socioeconomic History  . Marital status: Divorced    Spouse name: Not on file  . Number of children: 1  . Years of education: Not on file  . Highest education level: Not on file  Occupational History  . Occupation: Medical illustrator: [employer]  Social Needs  . Financial resource strain: Not on file  . Food insecurity:    Worry: Not on file    Inability: Not on file  . Transportation  needs:    Medical: Not on file    Non-medical: Not on file  Tobacco Use  . Smoking status: Current Every Day Smoker    Packs/day: 0.50    Years: 30.00    Pack years: 15.00    Types: Cigarettes  . Smokeless tobacco: Never Used  . Tobacco comment: 3/4  ppd  Substance and Sexual Activity  . Alcohol use: Yes    Alcohol/week: 0.0 standard drinks    Comment: OCCASIONAL  . Drug use: Yes    Types: Marijuana    Comment: "every other weekend" per pt.  . Sexual activity: Not on file  Lifestyle  . Physical activity:    Days per week: Not on file    Minutes per session: Not on file  . Stress: Not on file  Relationships  . Social connections:    Talks on phone: Not on file    Gets together: Not on file    Attends  religious service: Not on file    Active member of club or organization: Not on file    Attends meetings of clubs or organizations: Not on file    Relationship status: Not on file  Other Topics Concern  . Not on file  Social History Narrative   Her mother lives w/ her        Allergies: Allergies  Allergen Reactions  . Latex Rash    Outpatient Meds: Current Outpatient Medications  Medication Sig Dispense Refill  . acyclovir (ZOVIRAX) 400 MG tablet Take 1 tablet (400 mg total) by mouth 2 (two) times daily as needed. 60 tablet 3  . amLODipine (NORVASC) 5 MG tablet TAKE 1 TABLET BY MOUTH ONCE DAILY 30 tablet 10  . budesonide-formoterol (SYMBICORT) 160-4.5 MCG/ACT inhaler Inhale 2 puffs into the lungs 2 (two) times daily. 10.2 g 5  . carvedilol (COREG) 12.5 MG tablet Take 1 tablet (12.5 mg total) by mouth 2 (two) times daily with a meal. 180 tablet 1  . fluticasone (FLONASE) 50 MCG/ACT nasal spray Place 2 sprays into both nostrils daily. 16 g 5  . gabapentin (NEURONTIN) 100 MG capsule 1 capsule in the morning and at noon, 3 at night 150 capsule 3  . simvastatin (ZOCOR) 20 MG tablet Take 1 tablet (20 mg total) by mouth at bedtime. 90 tablet 3   No current facility-administered medications for this visit.       ___________________________________________________________________ Objective   Exam:  BP 106/74   Pulse 72   Ht 5\' 7"  (1.702 m)   Wt 162 lb (73.5 kg)   BMI 25.37 kg/m  Exam chaperoned by our MA June  General: this is a(n) well-appearing woman  Eyes: sclera anicteric, no redness  ENT: oral mucosa moist without lesions, no cervical or supraclavicular lymphadenopathy  CV: RRR without murmur, S1/S2, no JVD, no peripheral edema  Resp: clear to auscultation bilaterally, normal RR and effort noted  GI: soft, no tenderness, with active bowel sounds. No guarding or palpable organomegaly noted.  Skin; warm and dry, no rash or jaundice noted  Neuro: awake, alert and  oriented x 3. Normal gross motor function and fluent speech Rectal: Normal external, normal DRE that tenderness. Anoscopy reveals small internal hemorrhoids   Assessment: Encounter Diagnosis  Name Primary?  . Bleeding internal hemorrhoids Yes    Brief period of bleeding that has now resolved.  It may have been precipitated by aspirin use.  Plan:  No further testing needed now.  If the hemorrhoids are bleeding on a  regular basis, she will let me know and we can discuss hemorrhoidal banding.  Last colon polyp in 2010 reportedly 13 mm, but difficult to determine if it was truly that size on photos.  With that and excellent prep with no polyps found on colonoscopy 4 years ago, 7-year interval is appropriate.  Thank you for the courtesy of this consult.  Please call me with any questions or concerns.  Nelida Meuse III  CC: Kathlene November, MD

## 2018-02-16 NOTE — Patient Instructions (Signed)
If you are age 56 or older, your body mass index should be between 23-30. Your Body mass index is 25.37 kg/m. If this is out of the aforementioned range listed, please consider follow up with your Primary Care Provider.  If you are age 54 or younger, your body mass index should be between 19-25. Your Body mass index is 25.37 kg/m. If this is out of the aformentioned range listed, please consider follow up with your Primary Care Provider.   Follow up as needed. [phone]  It was a pleasure to see you today!  Dr. Loletha Carrow

## 2018-02-25 ENCOUNTER — Encounter: Payer: Self-pay | Admitting: Internal Medicine

## 2018-02-25 ENCOUNTER — Ambulatory Visit (INDEPENDENT_AMBULATORY_CARE_PROVIDER_SITE_OTHER): Payer: BLUE CROSS/BLUE SHIELD | Admitting: Internal Medicine

## 2018-02-25 VITALS — BP 122/78 | HR 72 | Temp 98.2°F | Resp 16 | Ht 67.0 in | Wt 160.5 lb

## 2018-02-25 DIAGNOSIS — R21 Rash and other nonspecific skin eruption: Secondary | ICD-10-CM

## 2018-02-25 NOTE — Progress Notes (Signed)
Subjective:    Patient ID: Erin Fox, female    DOB: 03/25/1960, 58 y.o.   MRN: [id number]  DOS:  02/25/2018 Type of visit - description: Acute visit, main concern is rash for 1 week.  She provides the following history.  2 months ago she noted a "spot" at the R  leg near the knee.  Went to see gynecology 02/01/2018, diagnosed with bacterial vaginitis, was prescribed a "antibiotic", apparently it was not Diflucan.   At the same time the gynecologist noted some tongue white discharge, possibly thrush, was not prescribed anything specific. That is better   The reason she is here today is because a week ago developed a rash at the neck, and chest. No itching, no blistery.  Overall feels a slightly better.  In the past she had ptyriasis rosea, thinks is the same.  New medication: Gabapentin for several months  Review of Systems Denies fever, chills. Feels well. No unusual aches. No rash or itching at the hands, feet, legs.  Past Medical History:  Diagnosis Date  . Anxiety   . Cyst on ear    Left ear  . Diverticulosis of colon   . Erythematous bladder mucosa   . Hematuria    w/u neg ~ 01-2014, 03-2014  . History of colon polyps   . HSV (herpes simplex virus) infection   . Hyperlipidemia   . Hypertension   . Insomnia   . Nephrolithiasis    NON-OBSTRUCTIVE  . OAB (overactive bladder)   . 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 Wears glasses     Past Surgical History:  Procedure Laterality Date  . 5th toes resected bilateral  1990's   bone's removed  . ABDOMINAL HYSTERECTOMY  1984   W/ UNILATERAL SALPINGOOPHORECTOMY  . ABDOMINOPLASTY  1990  . BREAST BIOPSY Right 01-02-2004  . CESAREAN SECTION    . COLONOSCOPY W/ POLYPECTOMY  last one 02-14-2014  . CYSTO/  RETROGRADE PYELOGRAM/  LEFT URETEROSCOPY/  BLADDER BIOPSY  08-27-2004  . CYSTOSCOPY W/ RETROGRADES Bilateral 03/17/2014   Procedure: CYSTOSCOPY WITH RETROGRADE PYELOGRAM;  Surgeon: Alexis Frock,  MD;  Location: [facility];  Service: Urology;  Laterality: Bilateral;  . CYSTOSCOPY WITH BIOPSY N/A 03/17/2014   Procedure: CYSTOSCOPY WITH BIOPSY AND FULGERATION;  Surgeon: Alexis Frock, MD;  Location: [facility];  Service: Urology;  Laterality: N/A;  . OTHER SURGICAL HISTORY Left 2016   Cyst Removal on Left ear  . REMOVAL BARTHOLIN CYST/ GLAND AND REVISION LEFT LABIA  06-10-2004    Social History   Socioeconomic History  . Marital status: Divorced    Spouse name: Not on file  . Number of children: 1  . Years of education: Not on file  . Highest education level: Not on file  Occupational History  . Occupation: Medical illustrator: [employer]  Social Needs  . Financial resource strain: Not on file  . Food insecurity:    Worry: Not on file    Inability: Not on file  . Transportation needs:    Medical: Not on file    Non-medical: Not on file  Tobacco Use  . Smoking status: Current Every Day Smoker    Packs/day: 0.50    Years: 30.00    Pack years: 15.00    Types: Cigarettes  . Smokeless tobacco: Never Used  . Tobacco comment: 3/4  ppd  Substance and Sexual Activity  . Alcohol use: Yes    Alcohol/week: 0.0  standard drinks    Comment: OCCASIONAL  . Drug use: Yes    Types: Marijuana    Comment: "every other weekend" per pt.  . Sexual activity: Not on file  Lifestyle  . Physical activity:    Days per week: Not on file    Minutes per session: Not on file  . Stress: Not on file  Relationships  . Social connections:    Talks on phone: Not on file    Gets together: Not on file    Attends religious service: Not on file    Active member of club or organization: Not on file    Attends meetings of clubs or organizations: Not on file    Relationship status: Not on file  . Intimate partner violence:    Fear of current or ex partner: Not on file    Emotionally abused: Not on file    Physically abused: Not on file    Forced  sexual activity: Not on file  Other Topics Concern  . Not on file  Social History Narrative   Her mother lives w/ her          Allergies as of 02/25/2018      Reactions   Latex Rash      Medication List       Accurate as of February 25, 2018  9:24 AM. Always use your most recent med list.        acyclovir 400 MG tablet Commonly known as:  ZOVIRAX Take 1 tablet (400 mg total) by mouth 2 (two) times daily as needed.   amLODipine 5 MG tablet Commonly known as:  NORVASC TAKE 1 TABLET BY MOUTH ONCE DAILY   budesonide-formoterol 160-4.5 MCG/ACT inhaler Commonly known as:  SYMBICORT Inhale 2 puffs into the lungs 2 (two) times daily.   carvedilol 12.5 MG tablet Commonly known as:  COREG Take 1 tablet (12.5 mg total) by mouth 2 (two) times daily with a meal.   ciclopirox 0.77 % cream Commonly known as:  LOPROX Apply topically. As directed   fluticasone 50 MCG/ACT nasal spray Commonly known as:  FLONASE Place 2 sprays into both nostrils daily.   gabapentin 100 MG capsule Commonly known as:  NEURONTIN 1 capsule in the morning and at noon, 3 at night   simvastatin 20 MG tablet Commonly known as:  ZOCOR Take 1 tablet (20 mg total) by mouth at bedtime.           Objective:   Physical Exam Skin:        BP 122/78 (BP Location: Left Arm, Patient Position: Sitting, Cuff Size: Small)   Pulse 72   Temp 98.2 F (36.8 C) (Oral)   Resp 16   Ht 5\' 7"  (1.702 m)   Wt 160 lb 8 oz (72.8 kg)   SpO2 93%   BMI 25.14 kg/m  General:   Well developed, NAD, BMI noted. HEENT:  Normocephalic . Face symmetric, atraumatic Tone normal. Skin:  See graphic and picture of R side of the neck  Neurologic:  alert & oriented X3.  Speech normal, gait appropriate for age and unassisted Psych--  Cognition and judgment appear intact.  Cooperative with normal attention span and concentration.  Behavior appropriate. No anxious or depressed appearing.        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 Over active bladder,  s/p cysto   >1, 2011, 2016, etc, see urology notes  Non-obstructive nephrolithiasis  Plan: Rash: The rash that started 2 weeks ago at the torso, chest, could be pityriasis rosea.  It is getting better, she has no systemic symptoms, is not itching.  Recommend observation The skin lesion at the right knee, could be actually a ringworm (or herald  PT lesion?). Recommend to  place Cicloporix as prescribed on the right knee.  Otherwise observation. Thrush?  No evidence of thrush today Back pain, left leg weakness: Saw neurology, a NCV and EMG done, see below. 02/15/18 Nerve conduction studies done on both lower extremities shows evidence of dysfunction of the left peroneal nerve at the knee.  EMG evaluation of the left lower extremity shows isolated acute denervation involving the tibialis anterior muscle with sparing of the peroneus tertius muscle.  The study suggests an incomplete injury to the left peroneal nerve at the knee, no evidence of an overlying lumbosacral radiculopathy was seen.

## 2018-02-25 NOTE — Progress Notes (Signed)
Pre visit review using our clinic review tool, if applicable. No additional management support is needed unless otherwise documented below in the visit note. 

## 2018-02-25 NOTE — Patient Instructions (Signed)
Call if you are not gradually better  Apply the cream to the right knee twice a day for 1 week.

## 2018-02-26 NOTE — Assessment & Plan Note (Signed)
Rash: The rash that started 2 weeks ago at the torso, chest, could be pityriasis rosea.  It is getting better, she has no systemic symptoms, is not itching.  Recommend observation The skin lesion at the right knee, could be actually a ringworm (or herald  PT lesion?). Recommend to  place Cicloporix as prescribed on the right knee.  Otherwise observation. Thrush?  No evidence of thrush today Back pain, left leg weakness: Saw neurology, a NCV and EMG done, see below. 02/15/18 Nerve conduction studies done on both lower extremities shows evidence of dysfunction of the left peroneal nerve at the knee.  EMG evaluation of the left lower extremity shows isolated acute denervation involving the tibialis anterior muscle with sparing of the peroneus tertius muscle.  The study suggests an incomplete injury to the left peroneal nerve at the knee, no evidence of an overlying lumbosacral radiculopathy was seen.

## 2018-03-01 ENCOUNTER — Telehealth: Payer: Self-pay

## 2018-03-01 DIAGNOSIS — Z0279 Encounter for issue of other medical certificate: Secondary | ICD-10-CM

## 2018-03-01 NOTE — Telephone Encounter (Signed)
PA initiated via Covermymeds; KEY: [id number]. PA approved.   [id number];LEZVGJ:[id number];Review Type:Prior Auth;Coverage Start Date:01/30/2018;Coverage End Date:02/28/2021

## 2018-03-01 NOTE — Telephone Encounter (Signed)
Patient is calling and checking on the status of getting a refill on Symbicort

## 2018-03-01 NOTE — Telephone Encounter (Signed)
PA was approved. She should be able to get medication at pharmacy.

## 2018-04-19 ENCOUNTER — Other Ambulatory Visit: Payer: Self-pay | Admitting: Internal Medicine

## 2018-04-19 DIAGNOSIS — Z1231 Encounter for screening mammogram for malignant neoplasm of breast: Secondary | ICD-10-CM

## 2018-05-19 ENCOUNTER — Ambulatory Visit: Payer: BLUE CROSS/BLUE SHIELD

## 2018-05-27 ENCOUNTER — Encounter: Payer: BLUE CROSS/BLUE SHIELD | Admitting: Internal Medicine

## 2018-06-01 ENCOUNTER — Other Ambulatory Visit: Payer: Self-pay | Admitting: Neurology

## 2018-06-01 NOTE — Telephone Encounter (Signed)
No RV scheduled after Volente/EMG.  Refill gabapentin

## 2018-06-21 ENCOUNTER — Other Ambulatory Visit: Payer: Self-pay | Admitting: Internal Medicine

## 2018-06-22 ENCOUNTER — Other Ambulatory Visit: Payer: Self-pay | Admitting: Internal Medicine

## 2018-06-26 ENCOUNTER — Other Ambulatory Visit: Payer: Self-pay | Admitting: Internal Medicine

## 2018-07-09 ENCOUNTER — Ambulatory Visit: Payer: BLUE CROSS/BLUE SHIELD

## 2018-08-07 ENCOUNTER — Other Ambulatory Visit: Payer: Self-pay

## 2018-08-07 ENCOUNTER — Ambulatory Visit
Admission: RE | Admit: 2018-08-07 | Discharge: 2018-08-07 | Disposition: A | Discharge status: Home / Self Care | Payer: BLUE CROSS/BLUE SHIELD | Source: Ambulatory Visit | Attending: Internal Medicine | Admitting: Internal Medicine

## 2018-08-07 DIAGNOSIS — Z1231 Encounter for screening mammogram for malignant neoplasm of breast: Secondary | ICD-10-CM

## 2018-08-16 ENCOUNTER — Telehealth: Payer: Self-pay | Admitting: Gastroenterology

## 2018-08-16 NOTE — Telephone Encounter (Signed)
Patient called said that she started having blood in her stools again and she would like to know what to do. She will return to work at [employer] and will not be able to answer the phone she is okay with a detailed msg.

## 2018-08-16 NOTE — Telephone Encounter (Signed)
Pt returned call. Please call back.

## 2018-08-16 NOTE — Telephone Encounter (Signed)
Pt was seen in January and per note it states if bleeding comes back may discuss hemorrhoidal banding. Pt states she is having rectal bleeidng again. Please advise.

## 2018-08-17 NOTE — Telephone Encounter (Signed)
Please arrange in-person visit for probable banding.  Will discuss that day and, looks appropriate and patient agreeable,  will perform.

## 2018-08-17 NOTE — Telephone Encounter (Signed)
Pt scheduled for possible Hem Banding 09/14/18@1 :40pm. Pt aware of appt.

## 2018-08-27 ENCOUNTER — Other Ambulatory Visit: Payer: Self-pay | Admitting: Internal Medicine

## 2018-09-14 ENCOUNTER — Encounter: Payer: Self-pay | Admitting: Gastroenterology

## 2018-09-14 ENCOUNTER — Ambulatory Visit (INDEPENDENT_AMBULATORY_CARE_PROVIDER_SITE_OTHER): Payer: BC Managed Care – PPO | Admitting: Gastroenterology

## 2018-09-14 VITALS — BP 120/84 | HR 68 | Temp 97.7°F | Ht 67.0 in | Wt 153.8 lb

## 2018-09-14 DIAGNOSIS — K648 Other hemorrhoids: Secondary | ICD-10-CM

## 2018-09-14 DIAGNOSIS — R194 Change in bowel habit: Secondary | ICD-10-CM

## 2018-09-14 MED ORDER — HYOSCYAMINE SULFATE 0.125 MG SL SUBL: 0.1250 mg | SUBLINGUAL_TABLET | Freq: Four times a day (QID) | SUBLINGUAL | 1 refills | Status: DC | PRN

## 2018-09-14 NOTE — Patient Instructions (Addendum)
If you are age 56 or older, your body mass index should be between 23-30. Your Body mass index is 24.09 kg/m. If this is out of the aforementioned range listed, please consider follow up with your Primary Care Provider.  If you are age 70 or younger, your body mass index should be between 19-25. Your Body mass index is 24.09 kg/m. If this is out of the aformentioned range listed, please consider follow up with your Primary Care Provider.   HEMORRHOID BANDING PROCEDURE    FOLLOW-UP CARE   1. The procedure you have had should have been relatively painless since the banding of the area involved does not have nerve endings and there is no pain sensation.  The rubber band cuts off the blood supply to the hemorrhoid and the band may fall off as soon as 48 hours after the banding (the band may occasionally be seen in the toilet bowl following a bowel movement). You may notice a temporary feeling of fullness in the rectum which should respond adequately to plain Tylenol or Motrin.  2. Following the banding, avoid strenuous exercise that evening and resume full activity the next day.  A sitz bath (soaking in a warm tub) or bidet is soothing, and can be useful for cleansing the area after bowel movements.     3. To avoid constipation, take two tablespoons of natural wheat bran, natural oat bran, flax, Benefiber or any over the counter fiber supplement and increase your water intake to 7-8 glasses daily.    4. Unless you have been prescribed anorectal medication, do not put anything inside your rectum for two weeks: No suppositories, enemas, fingers, etc.  5. Occasionally, you may have more bleeding than usual after the banding procedure.  This is often from the untreated hemorrhoids rather than the treated one.  Don't be concerned if there is a tablespoon or so of blood.  If there is more blood than this, lie flat with your bottom higher than your head and apply an ice pack to the area. If the bleeding  does not stop within a half an hour or if you feel faint, call our office at [phone] or go to the emergency room.  6. Problems are not common; however, if there is a substantial amount of bleeding, severe pain, chills, fever or difficulty passing urine (very rare) or other problems, you should call us at [phone] or report to the nearest emergency room.  7. Do not stay seated continuously for more than 2-3 hours for a day or two after the procedure.  Tighten your buttock muscles 10-15 times every two hours and take 10-15 deep breaths every 1-2 hours.  Do not spend more than a few minutes on the toilet if you cannot empty your bowel; instead re-visit the toilet at a later time.    It was a pleasure to see you today!  Dr. Loletha Carrow

## 2018-09-14 NOTE — Progress Notes (Signed)
[facility] Progress Note  Chief Complaint: Rectal bleeding and altered bowel habits  Subjective  History: Seen January 2020 for self-limited hemorrhoidal bleeding.  Last colonoscopy 2016 with Dr. Deatra Ina, no polyps.  13 mm adenoma removed in 2010.  She has had intermittent episodes of bleeding, and when she called recently it had occurred almost daily for 3 weeks.  It has stopped since then, which she finds frustrating.  Over the about the last year she has had a change in bowel habits where she will have to have a bowel movement with some urgency soon after meals.  Stool is semi-formed and occasionally "powdery".  She also has an upper abdominal protuberance that she is concerned about. She thought the change in bowel habits might be due to vitamin D, but decreasing the dose has not changed symptoms.  ROS: Cardiovascular:  no chest pain Respiratory: no dyspnea  The patient's Past Medical, Family and Social History were reviewed and are on file in the EMR.  Objective:  Med list reviewed  Current Outpatient Medications:  .  acyclovir (ZOVIRAX) 400 MG tablet, Take 1 tablet (400 mg total) by mouth 2 (two) times daily as needed., Disp: 60 tablet, Rfl: 3 .  amLODipine (NORVASC) 5 MG tablet, Take 1 tablet (5 mg total) by mouth daily., Disp: 90 tablet, Rfl: 0 .  carvedilol (COREG) 12.5 MG tablet, Take 1 tablet (12.5 mg total) by mouth 2 (two) times daily with a meal., Disp: 180 tablet, Rfl: 0 .  Cholecalciferol (VITAMIN D) 50 MCG (2000 UT) tablet, Take 2,000 Units by mouth daily., Disp: , Rfl:  .  fluticasone (FLONASE) 50 MCG/ACT nasal spray, Place 2 sprays into both nostrils daily., Disp: 16 g, Rfl: 5 .  simvastatin (ZOCOR) 20 MG tablet, Take 1 tablet (20 mg total) by mouth at bedtime., Disp: 90 tablet, Rfl: 1 .  SYMBICORT 160-4.5 MCG/ACT inhaler, Inhale 2 puffs into the lungs 2 (two) times daily., Disp: 10.2 g, Rfl: 5 .  gabapentin (NEURONTIN) 100 MG capsule, TAKE 1 CAPSULE BY  MOUTH THREE TIMES DAILY, Disp: 270 capsule, Rfl: 1 .  hyoscyamine (LEVSIN SL) 0.125 MG SL tablet, Place 1 tablet (0.125 mg total) under the tongue every 6 (six) hours as needed., Disp: 30 tablet, Rfl: 1   Vital signs in last 24 hrs: Vitals:   09/14/18 1525  BP: 120/84  Pulse: 68  Temp: 97.7 F (36.5 C)    Physical Exam Exam chaperoned by CMA Magdalene River Well-appearing  HEENT: sclera anicteric, oral mucosa moist without lesions  Neck: supple, no thyromegaly, JVD or lymphadenopathy  Cardiac: RRR without murmurs, S1S2 heard, no peripheral edema  Pulm: clear to auscultation bilaterally, normal RR and effort noted  Abdomen: soft, no tenderness, with active bowel sounds. No guarding or palpable hepatosplenomegaly.  Small upper abdominal rectus diastasis  Skin; warm and dry, no jaundice or rash Rectal: No tenderness or fissure, no palpable internal lesions. Anoscopy: 3 columns of prominent internal hemorrhoids    @ASSESSMENTPLANBEGIN @ Assessment: Encounter Diagnoses  Name Primary?  . Change in bowel habit Yes  . Bleeding internal hemorrhoids    Change in bowel habits sounds like IBS.  Trial of hyoscyamine was given.  She has intermittently bleeding internal hemorrhoids.  We discussed hemorrhoidal banding, including risks and benefits and she is agreeable.  PROCEDURE NOTE: The patient presents with symptomatic grade 2 hemorrhoids, requesting rubber band ligation of his/her hemorrhoidal disease. All risks, benefits and alternative forms of therapy were described and informed  consent was obtained.    The anorectum was pre-medicated with 0.125% NTG and lubricant. The decision was made to band the RP internal hemorrhoids, and the [place] was used to perform band ligation without complication. Digital anorectal examination was then performed to assure proper positioning of the band, and to adjust the banded tissue as required. The patient was discharged home  without pain or other issues. Dietary and behavioral recommendations were given and along with follow-up instructions.   The patient will return in 3 to 4 weeks for follow-up and possible additional banding as required. No complications were encountered and the patient tolerated the procedure well.   Nelida Meuse III

## 2018-09-28 ENCOUNTER — Other Ambulatory Visit: Payer: Self-pay | Admitting: Internal Medicine

## 2018-09-28 MED ORDER — CARVEDILOL 12.5 MG PO TABS: 12.5000 mg | ORAL_TABLET | Freq: Two times a day (BID) | ORAL | 0 refills | Status: DC

## 2018-09-28 NOTE — Telephone Encounter (Signed)
Pt calling about this medication. Pt scheduled physical for 01/05/2019. Please advise.

## 2018-09-28 NOTE — Addendum Note (Signed)
Addended byDamita Dunnings D on: 09/28/2018 03:36 PM   Modules accepted: Orders

## 2018-09-28 NOTE — Telephone Encounter (Signed)
Rx sent 

## 2018-10-21 DIAGNOSIS — H6123 Impacted cerumen, bilateral: Secondary | ICD-10-CM | POA: Diagnosis not present

## 2018-10-26 ENCOUNTER — Other Ambulatory Visit: Payer: Self-pay

## 2018-10-26 ENCOUNTER — Encounter: Payer: Self-pay | Admitting: Gastroenterology

## 2018-10-26 ENCOUNTER — Ambulatory Visit (INDEPENDENT_AMBULATORY_CARE_PROVIDER_SITE_OTHER): Payer: BC Managed Care – PPO | Admitting: Gastroenterology

## 2018-10-26 DIAGNOSIS — K648 Other hemorrhoids: Secondary | ICD-10-CM

## 2018-10-26 NOTE — Progress Notes (Signed)
PROCEDURE NOTE: The patient presents with symptomatic grade 2 hemorrhoids, requesting rubber band ligation of his/her hemorrhoidal disease. All risks, benefits and alternative forms of therapy were described and informed consent was obtained.  DRE revealed: normal  Her bleeding stopped after recent banding, and with control of diarrhea using as needed Levsin.  The anorectum was pre-medicated with 0.125% NTG and lubricant. The decision was made to band the RA internal hemorrhoids, and the [place] was used to perform band ligation without complication. Digital anorectal examination was then performed to assure proper positioning of the band, and to adjust the banded tissue as required. The patient was discharged home without pain or other issues. Dietary and behavioral recommendations were given and along with follow-up instructions.   The following adjunctive treatments were recommended:  Ibuprofen as needed for discomfort, continue use of Levsin as needed  The patient will return 3 to 4 weeks for follow-up and possible additional banding as required. No complications were encountered and the patient tolerated the procedure well.

## 2018-10-26 NOTE — Patient Instructions (Signed)
If you are age 56 or older, your body mass index should be between 23-30. Your Body mass index is 24.28 kg/m. If this is out of the aforementioned range listed, please consider follow up with your Primary Care Provider.  If you are age 56 or younger, your body mass index should be between 19-25. Your Body mass index is 24.28 kg/m. If this is out of the aformentioned range listed, please consider follow up with your Primary Care Provider.   HEMORRHOID BANDING PROCEDURE    FOLLOW-UP CARE   1. The procedure you have had should have been relatively painless since the banding of the area involved does not have nerve endings and there is no pain sensation.  The rubber band cuts off the blood supply to the hemorrhoid and the band may fall off as soon as 48 hours after the banding (the band may occasionally be seen in the toilet bowl following a bowel movement). You may notice a temporary feeling of fullness in the rectum which should respond adequately to plain Tylenol or Motrin.  2. Following the banding, avoid strenuous exercise that evening and resume full activity the next day.  A sitz bath (soaking in a warm tub) or bidet is soothing, and can be useful for cleansing the area after bowel movements.     3. To avoid constipation, take two tablespoons of natural wheat bran, natural oat bran, flax, Benefiber or any over the counter fiber supplement and increase your water intake to 7-8 glasses daily.    4. Unless you have been prescribed anorectal medication, do not put anything inside your rectum for two weeks: No suppositories, enemas, fingers, etc.  5. Occasionally, you may have more bleeding than usual after the banding procedure.  This is often from the untreated hemorrhoids rather than the treated one.  Don't be concerned if there is a tablespoon or so of blood.  If there is more blood than this, lie flat with your bottom higher than your head and apply an ice pack to the area. If the bleeding  does not stop within a half an hour or if you feel faint, call our office at [phone] or go to the emergency room.  6. Problems are not common; however, if there is a substantial amount of bleeding, severe pain, chills, fever or difficulty passing urine (very rare) or other problems, you should call us at [phone] or report to the nearest emergency room.  7. Do not stay seated continuously for more than 2-3 hours for a day or two after the procedure.  Tighten your buttock muscles 10-15 times every two hours and take 10-15 deep breaths every 1-2 hours.  Do not spend more than a few minutes on the toilet if you cannot empty your bowel; instead re-visit the toilet at a later time.    It was a pleasure to see you today!  Dr. Loletha Carrow

## 2018-12-05 ENCOUNTER — Other Ambulatory Visit: Payer: Self-pay | Admitting: Internal Medicine

## 2018-12-07 ENCOUNTER — Encounter: Payer: BC Managed Care – PPO | Admitting: Gastroenterology

## 2018-12-13 ENCOUNTER — Encounter: Payer: Self-pay | Admitting: Gastroenterology

## 2018-12-13 ENCOUNTER — Other Ambulatory Visit: Payer: Self-pay

## 2018-12-13 ENCOUNTER — Ambulatory Visit (INDEPENDENT_AMBULATORY_CARE_PROVIDER_SITE_OTHER): Payer: BC Managed Care – PPO | Admitting: Gastroenterology

## 2018-12-13 DIAGNOSIS — K648 Other hemorrhoids: Secondary | ICD-10-CM | POA: Diagnosis not present

## 2018-12-13 DIAGNOSIS — L820 Inflamed seborrheic keratosis: Secondary | ICD-10-CM | POA: Diagnosis not present

## 2018-12-13 DIAGNOSIS — D229 Melanocytic nevi, unspecified: Secondary | ICD-10-CM

## 2018-12-13 DIAGNOSIS — D485 Neoplasm of uncertain behavior of skin: Secondary | ICD-10-CM | POA: Diagnosis not present

## 2018-12-13 HISTORY — DX: Melanocytic nevi, unspecified: D22.9

## 2018-12-13 NOTE — Progress Notes (Signed)
PROCEDURE NOTE: The patient presents with symptomatic grade 2 hemorrhoids, requesting rubber band ligation of his/her hemorrhoidal disease. All risks, benefits and alternative forms of therapy were described and informed consent was obtained.  DRE revealed: nml   The anorectum was pre-medicated with 0.125% NTG and lubricant. The decision was made to band the LL internal hemorrhoids, and the [place] was used to perform band ligation without complication. Digital anorectal examination was then performed to assure proper positioning of the band, and to adjust the banded tissue as required. The patient was discharged home without pain or other issues. Dietary and behavioral recommendations were given and along with follow-up instructions.   The following adjunctive treatments were recommended:  Prn use of levsin  The patient will return as needed  for follow-up and possible additional banding as required. No complications were encountered and the patient tolerated the procedure well.

## 2018-12-13 NOTE — Patient Instructions (Signed)
If you are age 56 or older, your body mass index should be between 23-30. Your Body mass index is 23.46 kg/m. If this is out of the aforementioned range listed, please consider follow up with your Primary Care Provider.  If you are age 6 or younger, your body mass index should be between 19-25. Your Body mass index is 23.46 kg/m. If this is out of the aformentioned range listed, please consider follow up with your Primary Care Provider.   HEMORRHOID BANDING PROCEDURE    FOLLOW-UP CARE   1. The procedure you have had should have been relatively painless since the banding of the area involved does not have nerve endings and there is no pain sensation.  The rubber band cuts off the blood supply to the hemorrhoid and the band may fall off as soon as 48 hours after the banding (the band may occasionally be seen in the toilet bowl following a bowel movement). You may notice a temporary feeling of fullness in the rectum which should respond adequately to plain Tylenol or Motrin.  2. Following the banding, avoid strenuous exercise that evening and resume full activity the next day.  A sitz bath (soaking in a warm tub) or bidet is soothing, and can be useful for cleansing the area after bowel movements.     3. To avoid constipation, take two tablespoons of natural wheat bran, natural oat bran, flax, Benefiber or any over the counter fiber supplement and increase your water intake to 7-8 glasses daily.    4. Unless you have been prescribed anorectal medication, do not put anything inside your rectum for two weeks: No suppositories, enemas, fingers, etc.  5. Occasionally, you may have more bleeding than usual after the banding procedure.  This is often from the untreated hemorrhoids rather than the treated one.  Don't be concerned if there is a tablespoon or so of blood.  If there is more blood than this, lie flat with your bottom higher than your head and apply an ice pack to the area. If the bleeding  does not stop within a half an hour or if you feel faint, call our office at [phone] or go to the emergency room.  6. Problems are not common; however, if there is a substantial amount of bleeding, severe pain, chills, fever or difficulty passing urine (very rare) or other problems, you should call us at [phone] or report to the nearest emergency room.  7. Do not stay seated continuously for more than 2-3 hours for a day or two after the procedure.  Tighten your buttock muscles 10-15 times every two hours and take 10-15 deep breaths every 1-2 hours.  Do not spend more than a few minutes on the toilet if you cannot empty your bowel; instead re-visit the toilet at a later time.    It was a pleasure to see you today!  Dr. Loletha Carrow

## 2018-12-31 ENCOUNTER — Other Ambulatory Visit: Payer: Self-pay | Admitting: Internal Medicine

## 2019-01-04 ENCOUNTER — Other Ambulatory Visit: Payer: Self-pay

## 2019-01-05 ENCOUNTER — Ambulatory Visit (INDEPENDENT_AMBULATORY_CARE_PROVIDER_SITE_OTHER): Payer: BC Managed Care – PPO | Admitting: Internal Medicine

## 2019-01-05 ENCOUNTER — Encounter: Payer: Self-pay | Admitting: Internal Medicine

## 2019-01-05 VITALS — BP 133/91 | HR 56 | Temp 97.0°F | Resp 16 | Ht 68.0 in | Wt 154.5 lb

## 2019-01-05 DIAGNOSIS — E785 Hyperlipidemia, unspecified: Secondary | ICD-10-CM

## 2019-01-05 DIAGNOSIS — Z0000 Encounter for general adult medical examination without abnormal findings: Secondary | ICD-10-CM | POA: Diagnosis not present

## 2019-01-05 LAB — CBC WITH DIFFERENTIAL/PLATELET
Basophils Absolute: 0 10*3/uL (ref 0.0–0.1)
Basophils Relative: 1.2 % (ref 0.0–3.0)
Eosinophils Absolute: 0.1 10*3/uL (ref 0.0–0.7)
Eosinophils Relative: 2.1 % (ref 0.0–5.0)
HCT: 38.3 % (ref 36.0–46.0)
Hemoglobin: 13 g/dL (ref 12.0–15.0)
Lymphocytes Relative: 29.7 % (ref 12.0–46.0)
Lymphs Abs: 1.1 10*3/uL (ref 0.7–4.0)
MCHC: 33.9 g/dL (ref 30.0–36.0)
MCV: 101.5 fl — ABNORMAL HIGH (ref 78.0–100.0)
Monocytes Absolute: 0.4 10*3/uL (ref 0.1–1.0)
Monocytes Relative: 10.3 % (ref 3.0–12.0)
Neutro Abs: 2.1 10*3/uL (ref 1.4–7.7)
Neutrophils Relative %: 56.7 % (ref 43.0–77.0)
Platelets: 201 10*3/uL (ref 150.0–400.0)
RBC: 3.77 Mil/uL — ABNORMAL LOW (ref 3.87–5.11)
RDW: 13.9 % (ref 11.5–15.5)
WBC: 3.7 10*3/uL — ABNORMAL LOW (ref 4.0–10.5)

## 2019-01-05 LAB — LIPID PANEL
Cholesterol: 217 mg/dL — ABNORMAL HIGH (ref 0–200)
HDL: 111.2 mg/dL (ref >39.00)
LDL Cholesterol: 91 mg/dL (ref 0–99)
NonHDL: 105.57
Total CHOL/HDL Ratio: 2
Triglycerides: 75 mg/dL (ref 0.0–149.0)
VLDL: 15 mg/dL (ref 0.0–40.0)

## 2019-01-05 LAB — COMPREHENSIVE METABOLIC PANEL
ALT: 23 U/L (ref 0–35)
AST: 24 U/L (ref 0–37)
Albumin: 3.8 g/dL (ref 3.5–5.2)
Alkaline Phosphatase: 68 U/L (ref 39–117)
BUN: 8 mg/dL (ref 6–23)
CO2: 30 mEq/L (ref 19–32)
Calcium: 9 mg/dL (ref 8.4–10.5)
Chloride: 105 mEq/L (ref 96–112)
Creatinine, Ser: 0.66 mg/dL (ref 0.40–1.20)
GFR: 111.75 mL/min (ref >60.00)
Glucose, Bld: 90 mg/dL (ref 70–99)
Potassium: 3.8 mEq/L (ref 3.5–5.1)
Sodium: 142 mEq/L (ref 135–145)
Total Bilirubin: 0.8 mg/dL (ref 0.2–1.2)
Total Protein: 6.3 g/dL (ref 6.0–8.3)

## 2019-01-05 LAB — HEMOGLOBIN A1C: Hgb A1c MFr Bld: 5.6 % (ref 4.6–6.5)

## 2019-01-05 LAB — TSH: TSH: 3.16 u[IU]/mL (ref 0.35–4.50)

## 2019-01-05 NOTE — Progress Notes (Signed)
Subjective:    Patient ID: Erin Fox, female    DOB: 02/08/1961, 58 y.o.   MRN: [id number]  DOS:  01/05/2019 Type of visit - description: CPX In general feeling well.  Wt Readings from Last 3 Encounters:  01/05/19 154 lb 8 oz (70.1 kg)  12/13/18 152 lb (68.9 kg)  10/26/18 155 lb (70.3 kg)     Review of Systems  A 14 point review of systems is negative   Past Medical History:  Diagnosis Date  . Anxiety   . Cyst on ear    Left ear  . Diverticulosis of colon   . Erythematous bladder mucosa   . Hematuria    w/u neg ~ 01-2014, 03-2014  . History of colon polyps   . HSV (herpes simplex virus) infection   . Hyperlipidemia   . Hypertension   . Insomnia   . Nephrolithiasis    NON-OBSTRUCTIVE  . OAB (overactive bladder)   . 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 Wears glasses     Past Surgical History:  Procedure Laterality Date  . 5th toes resected bilateral  1990's   bone's removed  . ABDOMINAL HYSTERECTOMY  1984   W/ UNILATERAL SALPINGOOPHORECTOMY  . ABDOMINOPLASTY  1990  . BREAST BIOPSY Right 01-02-2004  . CESAREAN SECTION    . COLONOSCOPY W/ POLYPECTOMY  last one 02-14-2014  . CYSTO/  RETROGRADE PYELOGRAM/  LEFT URETEROSCOPY/  BLADDER BIOPSY  08-27-2004  . CYSTOSCOPY W/ RETROGRADES Bilateral 03/17/2014   Procedure: CYSTOSCOPY WITH RETROGRADE PYELOGRAM;  Surgeon: Alexis Frock, MD;  Location[facility];  Service: Urology;  Laterality: Bilateral;  . CYSTOSCOPY WITH BIOPSY N/A 03/17/2014   Procedure: CYSTOSCOPY WITH BIOPSY AND FULGERATION;  Surgeon: Alexis Frock, MD;  Location: [facility];  Service: Urology;  Laterality: N/A;  . OTHER SURGICAL HISTORY Left 2016   Cyst Removal on Left ear  . REMOVAL BARTHOLIN CYST/ GLAND AND REVISION LEFT LABIA  06-10-2004    Social History   Socioeconomic History  . Marital status: Divorced    Spouse name: Not on file  . Number of children: 1  . Years of education: Not  on file  . Highest education level: Not on file  Occupational History  . Occupation: Medical illustrator: [employer]  Social Needs  . Financial resource strain: Not on file  . Food insecurity    Worry: Not on file    Inability: Not on file  . Transportation needs    Medical: Not on file    Non-medical: Not on file  Tobacco Use  . Smoking status: Current Every Day Smoker    Packs/day: 0.50    Years: 30.00    Pack years: 15.00    Types: Cigarettes  . Smokeless tobacco: Never Used  . Tobacco comment: 3/4  ppd  Substance and Sexual Activity  . Alcohol use: Yes    Alcohol/week: 0.0 standard drinks    Comment: OCCASIONAL  . Drug use: Yes    Types: Marijuana    Comment: "every other weekend" per pt.  . Sexual activity: Not on file  Lifestyle  . Physical activity    Days per week: Not on file    Minutes per session: Not on file  . Stress: Not on file  Relationships  . Social Herbalist on phone: Not on file    Gets together: Not on file    Attends religious service: Not on file  Active member of club or organization: Not on file    Attends meetings of clubs or organizations: Not on file    Relationship status: Not on file  . Intimate partner violence    Fear of current or ex partner: Not on file    Emotionally abused: Not on file    Physically abused: Not on file    Forced sexual activity: Not on file  Other Topics Concern  . Not on file  Social History Narrative   Her mother lives w/ her         Family History  Problem Relation Age of Onset  . Hyperlipidemia Mother   . Hypertension Mother        M and sister   . Diabetes Father   . Colon polyps Sister 7       8 polyps removed  . High blood pressure Sister   . Cancer Paternal Aunt        lung  . Colon cancer Other        uncle , dx at age 29s  . Colon cancer Paternal Uncle   . Breast cancer Neg Hx      Allergies as of 01/05/2019      Reactions   Latex Rash       Medication List       Accurate as of January 05, 2019 11:59 PM. If you have any questions, ask your nurse or doctor.        acyclovir 400 MG tablet Commonly known as: ZOVIRAX Take 1 tablet (400 mg total) by mouth 2 (two) times daily as needed.   amLODipine 5 MG tablet Commonly known as: NORVASC Take 1 tablet (5 mg total) by mouth daily.   carvedilol 12.5 MG tablet Commonly known as: COREG Take 1 tablet (12.5 mg total) by mouth 2 (two) times daily with a meal.   fluticasone 50 MCG/ACT nasal spray Commonly known as: FLONASE Place 2 sprays into both nostrils daily.   gabapentin 100 MG capsule Commonly known as: NEURONTIN TAKE 1 CAPSULE BY MOUTH THREE TIMES DAILY   hyoscyamine 0.125 MG SL tablet Commonly known as: LEVSIN SL Place 1 tablet (0.125 mg total) under the tongue every 6 (six) hours as needed.   simvastatin 20 MG tablet Commonly known as: ZOCOR Take 1 tablet (20 mg total) by mouth at bedtime.   Symbicort 160-4.5 MCG/ACT inhaler Generic drug: budesonide-formoterol Inhale 2 puffs into the lungs 2 (two) times daily.   Vitamin D 50 MCG (2000 UT) tablet Take 2,000 Units by mouth daily.           Objective:   Physical Exam Abdominal:      BP (!) 133/91 (BP Location: Left Arm, Patient Position: Sitting, Cuff Size: Small)   Pulse (!) 56   Temp (!) 97 F (36.1 C) (Temporal)   Resp 16   Ht 5\' 8"  (1.727 m)   Wt 154 lb 8 oz (70.1 kg)   SpO2 98%   BMI 23.49 kg/m  General: Well developed, NAD, BMI noted Neck: No  thyromegaly  HEENT:  Normocephalic . Face symmetric, atraumatic Lungs:  CTA B Normal respiratory effort, no intercostal retractions, no accessory muscle use. Heart: RRR,  no murmur.  Left leg larger in circumference compared to the right, at baseline. Abdomen:  Not distended, soft, non-tender. No rebound or rigidity.  Hernia.  See graphic Skin: Exposed areas without rash. Not pale. Not jaundice Neurologic:  alert & oriented X3.  Speech  normal,  gait appropriate for age and unassisted Strength symmetric and appropriate for age.  Psych: Cognition and judgment appear intact.  Cooperative with normal attention span and concentration.  Behavior appropriate. No anxious or depressed appearing.     Assessment     Assessment HTN: Onset 07-2016 Anxiety, insomnia Hyperlipidemia Reacive airway DZ dx 2019 HSV L leg edema,Venous insufficiency, since the 80s after she had her child. Over active bladder, s/p cysto   >1, 2011, 2016, etc, see urology notes  Non-obstructive nephrolithiasis Hemorrhoids- banding x 3 2020 IBS? Is dermatology regularly  PLAN Here for CPX HTN: BP today slightly elevated, very good readings outside the office, continue amlodipine, carvedilol. Hyperlipidemia: On simvastatin, checking labs IBS?  Having loose stools on and off, that was d/w GI, IBS?  Was Rx Levsin with mixed results. Hernia, abdominal wall: Incarceration symptoms discussed. Dermatology: Reports that she sees dermatology regularly RTC 1 year   This visit occurred during the SARS-CoV-2 public health emergency.  Safety protocols were in place, including screening questions prior to the visit, additional usage of staff PPE, and extensive cleaning of exam room while observing appropriate contact time as indicated for disinfecting solutions.

## 2019-01-05 NOTE — Patient Instructions (Addendum)
GO TO THE LAB : Get the blood work     GO TO THE FRONT DESK Schedule your next appointment   for a physical exam in 1 year    Check the  blood pressure months BP GOAL is between 110/65 and  135/85. If it is consistently higher or lower, let me know

## 2019-01-05 NOTE — Progress Notes (Signed)
Pre visit review using our clinic review tool, if applicable. No additional management support is needed unless otherwise documented below in the visit note. 

## 2019-01-06 NOTE — Assessment & Plan Note (Signed)
--  Td 2016  - shingrex d/w pt, will wait few years - s/p flu shot   -- h/o hysterectomy for benign reasons,saw gyn, Dr Mancel Bale 2019 per pt --MMG (-) 07/2018 , had a breast exam last year @ gyn --CCS: Cscope 11/ 2010: Polyps, diverticula, hemorrhoids. Cscope 1-16, no polyps, next 7 years per report Dr Deatra Ina.  --Tobacco abuse: Smoking 3/4 ppd, not ready to quit, counseled --Diet and exercise discussed, she is doing well  --Labs: CMP, FLP, CBC, TSH, A1c

## 2019-01-06 NOTE — Assessment & Plan Note (Signed)
Here for CPX HTN: BP today slightly elevated, very good readings outside the office, continue amlodipine, carvedilol. Hyperlipidemia: On simvastatin, checking labs IBS?  Having loose stools on and off, that was d/w GI, IBS?  Was Rx Levsin with mixed results. Hernia, abdominal wall: Incarceration symptoms discussed. Dermatology: Reports that she sees dermatology regularly RTC 1 year

## 2019-01-21 ENCOUNTER — Other Ambulatory Visit: Payer: Self-pay | Admitting: Internal Medicine

## 2019-01-31 DIAGNOSIS — L820 Inflamed seborrheic keratosis: Secondary | ICD-10-CM | POA: Diagnosis not present

## 2019-01-31 DIAGNOSIS — D229 Melanocytic nevi, unspecified: Secondary | ICD-10-CM | POA: Diagnosis not present

## 2019-04-01 ENCOUNTER — Other Ambulatory Visit: Payer: Self-pay | Admitting: Internal Medicine

## 2019-04-10 ENCOUNTER — Other Ambulatory Visit: Payer: Self-pay | Admitting: Internal Medicine

## 2019-05-30 ENCOUNTER — Other Ambulatory Visit: Payer: Self-pay | Admitting: Internal Medicine

## 2019-05-30 ENCOUNTER — Ambulatory Visit: Payer: BC Managed Care – PPO | Admitting: Physician Assistant

## 2019-06-27 ENCOUNTER — Other Ambulatory Visit: Payer: Self-pay | Admitting: Internal Medicine

## 2019-06-27 DIAGNOSIS — Z1231 Encounter for screening mammogram for malignant neoplasm of breast: Secondary | ICD-10-CM

## 2019-08-08 ENCOUNTER — Other Ambulatory Visit: Payer: Self-pay

## 2019-08-08 ENCOUNTER — Ambulatory Visit
Admission: RE | Admit: 2019-08-08 | Discharge: 2019-08-08 | Disposition: A | Discharge status: Home / Self Care | Payer: BC Managed Care – PPO | Source: Ambulatory Visit | Attending: Internal Medicine | Admitting: Internal Medicine

## 2019-08-08 DIAGNOSIS — Z1231 Encounter for screening mammogram for malignant neoplasm of breast: Secondary | ICD-10-CM | POA: Diagnosis not present

## 2019-08-11 ENCOUNTER — Other Ambulatory Visit: Payer: Self-pay | Admitting: Internal Medicine

## 2019-10-08 ENCOUNTER — Other Ambulatory Visit: Payer: Self-pay | Admitting: Internal Medicine

## 2019-11-10 DIAGNOSIS — Z01419 Encounter for gynecological examination (general) (routine) without abnormal findings: Secondary | ICD-10-CM | POA: Diagnosis not present

## 2019-11-10 DIAGNOSIS — Z6823 Body mass index (BMI) 23.0-23.9, adult: Secondary | ICD-10-CM | POA: Diagnosis not present

## 2019-11-14 ENCOUNTER — Telehealth: Payer: Self-pay | Admitting: Internal Medicine

## 2019-11-14 NOTE — Telephone Encounter (Signed)
CallerBryanda Mikel  Call Back # [phone]  Patient sates she would like Dr. Larose Kells to compare her lab results from 2019/2020 to determine if she has a hormorome imbalance, patient has been losing her hair. Pt seen by OBGyn and they asked her to contact her Pcp.

## 2019-11-14 NOTE — Telephone Encounter (Signed)
Please advise 

## 2019-11-16 NOTE — Telephone Encounter (Signed)
Spoke w/ Pt- informed of recommendations. Pt verbalized understanding.  

## 2019-11-16 NOTE — Telephone Encounter (Signed)
Her thyroid test was different compared to previous results but still within normal.  Plan is to recheck when she comes back.

## 2019-11-29 ENCOUNTER — Other Ambulatory Visit: Payer: Self-pay

## 2019-11-29 MED ORDER — CARVEDILOL 12.5 MG PO TABS: 12.5000 mg | ORAL_TABLET | Freq: Two times a day (BID) | ORAL | 0 refills | Status: DC

## 2019-11-29 MED ORDER — ACYCLOVIR 400 MG PO TABS: 400.0000 mg | ORAL_TABLET | Freq: Two times a day (BID) | ORAL | 0 refills | Status: DC | PRN

## 2019-11-29 MED ORDER — AMLODIPINE BESYLATE 5 MG PO TABS: 5.0000 mg | ORAL_TABLET | Freq: Every day | ORAL | 0 refills | Status: DC

## 2019-11-29 MED ORDER — SIMVASTATIN 20 MG PO TABS: 20.0000 mg | ORAL_TABLET | Freq: Every day | ORAL | 0 refills | Status: DC

## 2019-11-29 MED ORDER — SYMBICORT 160-4.5 MCG/ACT IN AERO: 2.0000 | INHALATION_SPRAY | Freq: Two times a day (BID) | RESPIRATORY_TRACT | 0 refills | Status: DC

## 2019-11-29 MED ORDER — FLUTICASONE PROPIONATE 50 MCG/ACT NA SUSP: 2.0000 | Freq: Every day | NASAL | 3 refills | Status: DC

## 2020-01-09 ENCOUNTER — Encounter: Payer: Self-pay | Admitting: Internal Medicine

## 2020-01-09 ENCOUNTER — Ambulatory Visit (INDEPENDENT_AMBULATORY_CARE_PROVIDER_SITE_OTHER): Payer: BC Managed Care – PPO | Admitting: Internal Medicine

## 2020-01-09 ENCOUNTER — Other Ambulatory Visit: Payer: Self-pay

## 2020-01-09 VITALS — BP 135/81 | HR 53 | Temp 98.8°F | Ht 68.0 in | Wt 153.4 lb

## 2020-01-09 DIAGNOSIS — Z0000 Encounter for general adult medical examination without abnormal findings: Secondary | ICD-10-CM | POA: Diagnosis not present

## 2020-01-09 LAB — COMPREHENSIVE METABOLIC PANEL
ALT: 21 U/L (ref 0–35)
AST: 23 U/L (ref 0–37)
Albumin: 4.3 g/dL (ref 3.5–5.2)
Alkaline Phosphatase: 67 U/L (ref 39–117)
BUN: 12 mg/dL (ref 6–23)
CO2: 32 mEq/L (ref 19–32)
Calcium: 9.1 mg/dL (ref 8.4–10.5)
Chloride: 104 mEq/L (ref 96–112)
Creatinine, Ser: 0.65 mg/dL (ref 0.40–1.20)
GFR: 97.42 mL/min (ref >60.00)
Glucose, Bld: 91 mg/dL (ref 70–99)
Potassium: 4.1 mEq/L (ref 3.5–5.1)
Sodium: 141 mEq/L (ref 135–145)
Total Bilirubin: 0.7 mg/dL (ref 0.2–1.2)
Total Protein: 6.7 g/dL (ref 6.0–8.3)

## 2020-01-09 LAB — CBC WITH DIFFERENTIAL/PLATELET
Basophils Absolute: 0 10*3/uL (ref 0.0–0.1)
Basophils Relative: 0.8 % (ref 0.0–3.0)
Eosinophils Absolute: 0.1 10*3/uL (ref 0.0–0.7)
Eosinophils Relative: 1.5 % (ref 0.0–5.0)
HCT: 39.9 % (ref 36.0–46.0)
Hemoglobin: 13.6 g/dL (ref 12.0–15.0)
Lymphocytes Relative: 18.6 % (ref 12.0–46.0)
Lymphs Abs: 0.8 10*3/uL (ref 0.7–4.0)
MCHC: 34.1 g/dL (ref 30.0–36.0)
MCV: 101.7 fl — ABNORMAL HIGH (ref 78.0–100.0)
Monocytes Absolute: 0.4 10*3/uL (ref 0.1–1.0)
Monocytes Relative: 9.7 % (ref 3.0–12.0)
Neutro Abs: 3.1 10*3/uL (ref 1.4–7.7)
Neutrophils Relative %: 69.4 % (ref 43.0–77.0)
Platelets: 212 10*3/uL (ref 150.0–400.0)
RBC: 3.92 Mil/uL (ref 3.87–5.11)
RDW: 13.5 % (ref 11.5–15.5)
WBC: 4.5 10*3/uL (ref 4.0–10.5)

## 2020-01-09 LAB — LIPID PANEL
Cholesterol: 224 mg/dL — ABNORMAL HIGH (ref 0–200)
HDL: 113.2 mg/dL (ref >39.00)
LDL Cholesterol: 97 mg/dL (ref 0–99)
NonHDL: 111.03
Total CHOL/HDL Ratio: 2
Triglycerides: 71 mg/dL (ref 0.0–149.0)
VLDL: 14.2 mg/dL (ref 0.0–40.0)

## 2020-01-09 LAB — T4, FREE: Free T4: 0.65 ng/dL (ref 0.60–1.60)

## 2020-01-09 LAB — HEMOGLOBIN A1C: Hgb A1c MFr Bld: 5.6 % (ref 4.6–6.5)

## 2020-01-09 LAB — TSH: TSH: 1.78 u[IU]/mL (ref 0.35–4.50)

## 2020-01-09 NOTE — Patient Instructions (Signed)
Continue checking your blood pressures regularly BP GOAL is between 110/65 and  135/85. If it is consistently higher or lower, let me know    GO TO THE LAB : Get the blood work     [place], [place] back for a physical exam in 1 year

## 2020-01-09 NOTE — Progress Notes (Signed)
Subjective:    Patient ID: Erin Fox, female    DOB: December 09, 1960, 59 y.o.   MRN: [id number]  DOS:  01/09/2020 Type of visit - description: CPX  In general feeling well. Her mother lives at home, she has memory problems, + stress.  Wt Readings from Last 3 Encounters:  01/09/20 153 lb 6.4 oz (69.6 kg)  01/05/19 154 lb 8 oz (70.1 kg)  12/13/18 152 lb (68.9 kg)     Review of Systems  Other than above, a 14 point review of systems is negative      Past Medical History:  Diagnosis Date  . Anxiety   . Cyst on ear    Left ear  . Diverticulosis of colon   . Erythematous bladder mucosa   . Hematuria    w/u neg ~ 01-2014, 03-2014  . History of colon polyps   . HSV (herpes simplex virus) infection   . Hyperlipidemia   . Hypertension   . Insomnia   . Nephrolithiasis    NON-OBSTRUCTIVE  . OAB (overactive bladder)   . 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 Wears glasses     Past Surgical History:  Procedure Laterality Date  . 5th toes resected bilateral  1990's   bone's removed  . ABDOMINAL HYSTERECTOMY  1984   W/ UNILATERAL SALPINGOOPHORECTOMY  . ABDOMINOPLASTY  1990  . BREAST BIOPSY Right 01-02-2004  . CESAREAN SECTION    . COLONOSCOPY W/ POLYPECTOMY  last one 02-14-2014  . CYSTO/  RETROGRADE PYELOGRAM/  LEFT URETEROSCOPY/  BLADDER BIOPSY  08-27-2004  . CYSTOSCOPY W/ RETROGRADES Bilateral 03/17/2014   Procedure: CYSTOSCOPY WITH RETROGRADE PYELOGRAM;  Surgeon: Alexis Frock, MD;  Location: [facility];  Service: Urology;  Laterality: Bilateral;  . CYSTOSCOPY WITH BIOPSY N/A 03/17/2014   Procedure: CYSTOSCOPY WITH BIOPSY AND FULGERATION;  Surgeon: Alexis Frock, MD;  Location: [facility];  Service: Urology;  Laterality: N/A;  . OTHER SURGICAL HISTORY Left 2016   Cyst Removal on Left ear  . REMOVAL BARTHOLIN CYST/ GLAND AND REVISION LEFT LABIA  06-10-2004    Allergies as of 01/09/2020      Reactions   Latex  Rash      Medication List       Accurate as of January 09, 2020 11:59 PM. If you have any questions, ask your nurse or doctor.        STOP taking these medications   gabapentin 100 MG capsule Commonly known as: NEURONTIN Stopped by: Kathlene November, MD     TAKE these medications   acyclovir 400 MG tablet Commonly known as: ZOVIRAX Take 1 tablet (400 mg total) by mouth 2 (two) times daily as needed.   amLODipine 5 MG tablet Commonly known as: NORVASC Take 1 tablet (5 mg total) by mouth daily.   carvedilol 12.5 MG tablet Commonly known as: COREG Take 1 tablet (12.5 mg total) by mouth 2 (two) times daily with a meal.   fluticasone 50 MCG/ACT nasal spray Commonly known as: FLONASE Place 2 sprays into both nostrils daily.   hyoscyamine 0.125 MG SL tablet Commonly known as: LEVSIN SL Place 1 tablet (0.125 mg total) under the tongue every 6 (six) hours as needed.   simvastatin 20 MG tablet Commonly known as: ZOCOR Take 1 tablet (20 mg total) by mouth at bedtime.   Symbicort 160-4.5 MCG/ACT inhaler Generic drug: budesonide-formoterol Inhale 2 puffs into the lungs 2 (two) times daily.   Vitamin D 50 MCG (2000  UT) tablet Take 2,000 Units by mouth daily.          Objective:   Physical Exam BP 135/81 (BP Location: Left Arm, Patient Position: Sitting, Cuff Size: Large)   Pulse (!) 53   Temp 98.8 F (37.1 C) (Oral)   Ht 5\' 8"  (1.727 m)   Wt 153 lb 6.4 oz (69.6 kg)   SpO2 100%   BMI 23.32 kg/m  General: Well developed, NAD, BMI noted Neck: No  thyromegaly  HEENT:  Normocephalic . Face symmetric, atraumatic Lungs:  CTA B Normal respiratory effort, no intercostal retractions, no accessory muscle use. Heart: RRR,  no murmur.  Abdomen:  Not distended, soft, non-tender. No rebound or rigidity.   Lower extremities: Trace edema, more noticeable on the left. Skin: Exposed areas without rash. Not pale. Not jaundice Neurologic:  alert & oriented X3.  Speech normal, gait  appropriate for age and unassisted Strength symmetric and appropriate for age.  Psych: Cognition and judgment appear intact.  Cooperative with normal attention span and concentration.  Behavior appropriate. No anxious or depressed appearing.     Assessment    Assessment HTN: Onset 07-2016 Anxiety, insomnia Hyperlipidemia Reacive airway DZ dx 2019 HSV L leg edema,Venous insufficiency, since the 80s after she had her child. Over active bladder, s/p cysto   >1, 2011, 2016, etc, see urology notes  Non-obstructive nephrolithiasis Hemorrhoids- banding x 3 2020 IBS? Sees dermatology regularly  PLAN For CPX HTN: On amlodipine, carvedilol, ambulatory BPs in the 120s.  No change, checking labs Anxiety, insomnia: On no meds, + stress related to taking care of her elderly mother who has some memory issues.  Listening therapy provided. Hyperlipidemia: On simvastatin, check labs Reactive airway disease: On Symbicort 2 puffs twice a day, well controlled, recommend to decrease to 1 puff twice daily and see how she does. IBS: Reports no symptoms at this point. RTC 1 year       This visit occurred during the SARS-CoV-2 public health emergency.  Safety protocols were in place, including screening questions prior to the visit, additional usage of staff PPE, and extensive cleaning of exam room while observing appropriate contact time as indicated for disinfecting solutions.

## 2020-01-10 ENCOUNTER — Encounter: Payer: Self-pay | Admitting: Internal Medicine

## 2020-01-10 NOTE — Assessment & Plan Note (Signed)
For CPX HTN: On amlodipine, carvedilol, ambulatory BPs in the 120s.  No change, checking labs Anxiety, insomnia: On no meds, + stress related to taking care of her elderly mother who has some memory issues.  Listening therapy provided. Hyperlipidemia: On simvastatin, check labs Reactive airway disease: On Symbicort 2 puffs twice a day, well controlled, recommend to decrease to 1 puff twice daily and see how she does. IBS: Reports no symptoms at this point. RTC 1 year

## 2020-01-10 NOTE — Assessment & Plan Note (Signed)
--  Td 2016  - shingrex d/w pt before, will wait few years -Had a covid vax x 3 - s/p flu shot   -- h/o hysterectomy for benign reasons,saw gyn, Dr Mancel Bale 2019 per pt --MMG (-) 07/2019 ([facility]) --CCS: Cscope 11/ 2010: Polyps, diverticula, hemorrhoids. Cscope 1-16, no polyps, next 7 years per report Dr Deatra Ina.  --Tobacco abuse: Smoking 3/4 ppd, still is not ready to quit, counseled --Diet and exercise discussed, she is doing well  --Labs: CMP, FLP, CBC, A1c, TSH -T4

## 2020-01-13 ENCOUNTER — Other Ambulatory Visit: Payer: BC Managed Care – PPO

## 2020-01-13 ENCOUNTER — Other Ambulatory Visit: Payer: Self-pay

## 2020-01-13 DIAGNOSIS — R7989 Other specified abnormal findings of blood chemistry: Secondary | ICD-10-CM

## 2020-01-13 DIAGNOSIS — E538 Deficiency of other specified B group vitamins: Secondary | ICD-10-CM | POA: Diagnosis not present

## 2020-01-13 NOTE — Progress Notes (Signed)
Received call from [place] at [facility] stating they are unable to add b12/folate inhouse. I have placed future orders for [facility] and asked Sai to release future orders and send specimen to [facility] for further testing.  They will process future orders for [facility].

## 2020-01-14 LAB — FOLATE: Folate: 4.1 ng/mL — ABNORMAL LOW

## 2020-01-14 LAB — VITAMIN B12: Vitamin B-12: 217 pg/mL (ref 200–1100)

## 2020-01-16 ENCOUNTER — Other Ambulatory Visit: Payer: Self-pay

## 2020-01-16 MED ORDER — FOLIC ACID 5 MG PO CAPS: 5.0000 mg | ORAL_CAPSULE | Freq: Every day | ORAL | 12 refills | Status: DC

## 2020-01-18 ENCOUNTER — Telehealth: Payer: Self-pay | Admitting: Internal Medicine

## 2020-01-18 MED ORDER — FOLIC ACID 5 MG PO CAPS
5.0000 mg | ORAL_CAPSULE | Freq: Every day | ORAL | 12 refills | Status: DC
Start: 2020-01-18 — End: 2020-01-18

## 2020-01-18 MED ORDER — FOLIC ACID 5 MG PO CAPS
5.0000 mg | ORAL_CAPSULE | Freq: Every day | ORAL | 12 refills | Status: DC
Start: 2020-01-18 — End: 2020-01-25

## 2020-01-18 NOTE — Telephone Encounter (Signed)
Send it to another pharmacy. If not available okay to get a 1 mg and take 2 tablets daily (even a lower dose should be okay)

## 2020-01-18 NOTE — Telephone Encounter (Signed)
Patient called , stating that [facility] does not carry 5MG  on Folic Acid 5 MG CAPS [[id number]]. Patient is wanting to know if she can get a printed prescription to take to another pharmacy or what should she do ?    Please Advise

## 2020-01-18 NOTE — Telephone Encounter (Signed)
Please advise 

## 2020-01-18 NOTE — Telephone Encounter (Signed)
Rx printed. LMOM informing Pt that Rx printed and at front desk for pick up at her convenience.

## 2020-01-24 ENCOUNTER — Telehealth: Payer: Self-pay | Admitting: Internal Medicine

## 2020-01-24 NOTE — Telephone Encounter (Signed)
Folic Acid 5 MG CAPS [[id number]]   Patient states [facility] did not have 5 mg only have 1 mg, please change doses to 1 mg and take 5 pill a day   [facility], [place].  [address] [place] [address]  Phone:  [phone] Fax:  [phone]

## 2020-01-24 NOTE — Telephone Encounter (Signed)
That is okay.

## 2020-01-24 NOTE — Telephone Encounter (Signed)
Please advise 

## 2020-01-25 MED ORDER — FOLIC ACID 1 MG PO TABS: 5.0000 mg | ORAL_TABLET | Freq: Every day | ORAL | 5 refills | Status: DC

## 2020-01-25 NOTE — Addendum Note (Signed)
Addended byDamita Dunnings D on: 01/25/2020 07:46 AM   Modules accepted: Orders

## 2020-01-25 NOTE — Telephone Encounter (Signed)
Rx sent 

## 2020-03-09 ENCOUNTER — Telehealth: Payer: Self-pay | Admitting: Internal Medicine

## 2020-03-09 MED ORDER — SIMVASTATIN 20 MG PO TABS
20.0000 mg | ORAL_TABLET | Freq: Every day | ORAL | 2 refills | Status: DC
Start: 2020-03-09 — End: 2020-07-25

## 2020-03-09 MED ORDER — CARVEDILOL 12.5 MG PO TABS
12.5000 mg | ORAL_TABLET | Freq: Two times a day (BID) | ORAL | 2 refills | Status: DC
Start: 2020-03-09 — End: 2020-07-25

## 2020-03-09 MED ORDER — AMLODIPINE BESYLATE 5 MG PO TABS
5.0000 mg | ORAL_TABLET | Freq: Every day | ORAL | 2 refills | Status: DC
Start: 2020-03-09 — End: 2020-07-03

## 2020-03-09 MED ORDER — SYMBICORT 160-4.5 MCG/ACT IN AERO
2.0000 | INHALATION_SPRAY | Freq: Two times a day (BID) | RESPIRATORY_TRACT | 2 refills | Status: DC
Start: 2020-03-09 — End: 2020-07-25

## 2020-03-09 NOTE — Telephone Encounter (Signed)
Patient states she need refill on all her medication. (could not specify the names)  [facility], [place] Phone:  [phone]  Fax:  [phone]

## 2020-03-09 NOTE — Telephone Encounter (Signed)
Patient states she would like to get 5 MG Folic acid prescription in stead of 1 MG

## 2020-03-09 NOTE — Telephone Encounter (Signed)
Rxs sent

## 2020-03-09 NOTE — Telephone Encounter (Signed)
Please advise 

## 2020-03-09 NOTE — Telephone Encounter (Signed)
Medication: folic acid (FOLVITE) 5 MG tablet [[id number]]    Has the patient contacted their pharmacy? No. (If no, request that the patient contact the pharmacy for the refill.) (If yes, when and what did the pharmacy advise?)  Preferred Pharmacy (with phone number or street name):  [place], [place] Phone:  [phone]  Fax:  [phone]       Agent: Please be advised that RX refills may take up to 3 business days. We ask that you follow-up with your pharmacy.

## 2020-03-12 MED ORDER — FOLIC ACID 5 MG PO CAPS: 5.0000 mg | ORAL_CAPSULE | Freq: Every day | ORAL | 3 refills | Status: DC

## 2020-03-12 NOTE — Telephone Encounter (Signed)
Okay to change to 5 mg

## 2020-03-12 NOTE — Telephone Encounter (Signed)
Rx sent 

## 2020-03-13 MED ORDER — FOLIC ACID 1 MG PO TABS
5.0000 mg | ORAL_TABLET | Freq: Every day | ORAL | 3 refills | Status: DC
Start: 2020-03-13 — End: 2021-02-27

## 2020-03-13 NOTE — Addendum Note (Signed)
Addended byDamita Dunnings D on: 03/13/2020 09:17 AM   Modules accepted: Orders

## 2020-03-13 NOTE — Telephone Encounter (Signed)
Faxed received from [facility], they do not have folic acid 5mg . Will inform Pt and resend 1mg  dose.

## 2020-03-27 ENCOUNTER — Other Ambulatory Visit: Payer: Self-pay

## 2020-05-21 ENCOUNTER — Telehealth: Payer: Self-pay | Admitting: Internal Medicine

## 2020-05-21 NOTE — Telephone Encounter (Signed)
FYI

## 2020-05-21 NOTE — Telephone Encounter (Signed)
Patient calling to give you blood pressure report Friday 148/84 Monday Left arm 163/86 Right arm 156/84

## 2020-05-22 NOTE — Telephone Encounter (Signed)
BP slightly elevated. Recommend watch her salt intake carefully, stay physically active.  Check BPs at least twice a week. If BP is consistently above 135/85: Call for office visit in 2 months.

## 2020-05-22 NOTE — Telephone Encounter (Signed)
Spoke w/ Pt- made her aware of recommendations. Pt verbalized understanding.

## 2020-06-21 ENCOUNTER — Ambulatory Visit: Payer: BC Managed Care – PPO | Admitting: Podiatry

## 2020-06-22 ENCOUNTER — Ambulatory Visit (INDEPENDENT_AMBULATORY_CARE_PROVIDER_SITE_OTHER): Payer: BC Managed Care – PPO

## 2020-06-22 ENCOUNTER — Other Ambulatory Visit: Payer: Self-pay

## 2020-06-22 ENCOUNTER — Ambulatory Visit (INDEPENDENT_AMBULATORY_CARE_PROVIDER_SITE_OTHER): Payer: BC Managed Care – PPO | Admitting: Podiatry

## 2020-06-22 DIAGNOSIS — M722 Plantar fascial fibromatosis: Secondary | ICD-10-CM | POA: Diagnosis not present

## 2020-06-22 DIAGNOSIS — M79671 Pain in right foot: Secondary | ICD-10-CM

## 2020-06-22 DIAGNOSIS — M79672 Pain in left foot: Secondary | ICD-10-CM

## 2020-06-26 ENCOUNTER — Encounter: Payer: Self-pay | Admitting: Podiatry

## 2020-06-26 NOTE — Progress Notes (Signed)
Subjective:  Patient ID: Erin Fox, female    DOB: 05-Nov-1960,  MRN: [id number]  Chief Complaint  Patient presents with  . Foot Problem    Right heel pains x 2-3 weeks now. Steel toed boots and 8-12hour shifts. Tx: icing/stretching/inserts/new shoes.     60 y.o. female presents with the above complaint.  Patient presents with complaint of right heel pain that is medial for 2 to 3 weeks.  Patient states she wears her steel toe boots wearing 8 to 12-hour shifts.  She tried icing stretching and social issues none of which has helped.  She would like to discuss treatment options for patient is now wearing Custom orthotics.  She has not seen anyone else prior to see me her pain scale is 8 out of 10.  She would like to discuss treatment options for Planter fasciitis.   Review of Systems: Negative except as noted in the HPI. Denies N/V/F/Ch.  Past Medical History:  Diagnosis Date  . Anxiety   . Cyst on ear    Left ear  . Diverticulosis of colon   . Erythematous bladder mucosa   . Hematuria    w/u neg ~ 01-2014, 03-2014  . History of colon polyps   . HSV (herpes simplex virus) infection   . Hyperlipidemia   . Hypertension   . Insomnia   . Nephrolithiasis    NON-OBSTRUCTIVE  . OAB (overactive bladder)   . 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 Wears glasses     Current Outpatient Medications:  .  acyclovir (ZOVIRAX) 400 MG tablet, Take 1 tablet (400 mg total) by mouth 2 (two) times daily as needed., Disp: 180 tablet, Rfl: 0 .  amLODipine (NORVASC) 5 MG tablet, Take 1 tablet (5 mg total) by mouth daily., Disp: 90 tablet, Rfl: 2 .  carvedilol (COREG) 12.5 MG tablet, Take 1 tablet (12.5 mg total) by mouth 2 (two) times daily with a meal., Disp: 180 tablet, Rfl: 2 .  Cholecalciferol (VITAMIN D) 50 MCG (2000 UT) tablet, Take 2,000 Units by mouth daily. (Patient not taking: Reported on 01/09/2020), Disp: , Rfl:  .  fluticasone (FLONASE) 50 MCG/ACT nasal spray,  Place 2 sprays into both nostrils daily., Disp: 48 g, Rfl: 3 .  folic acid (FOLVITE) 1 MG tablet, Take 5 tablets (5 mg total) by mouth daily., Disp: 450 tablet, Rfl: 3 .  hyoscyamine (LEVSIN SL) 0.125 MG SL tablet, Place 1 tablet (0.125 mg total) under the tongue every 6 (six) hours as needed. (Patient not taking: Reported on 01/09/2020), Disp: 30 tablet, Rfl: 1 .  PREVIDENT 5000 BOOSTER PLUS 1.1 % PSTE, Place onto teeth., Disp: , Rfl:  .  simvastatin (ZOCOR) 20 MG tablet, Take 1 tablet (20 mg total) by mouth at bedtime., Disp: 90 tablet, Rfl: 2 .  SYMBICORT 160-4.5 MCG/ACT inhaler, Inhale 2 puffs into the lungs 2 (two) times daily., Disp: 30.6 g, Rfl: 2  Social History   Tobacco Use  Smoking Status Current Every Day Smoker  . Packs/day: 0.50  . Years: 30.00  . Pack years: 15.00  . Types: Cigarettes  Smokeless Tobacco Never Used  Tobacco Comment   3/4  ppd    Allergies  Allergen Reactions  . Latex Rash   Objective:  There were no vitals filed for this visit. There is no height or weight on file to calculate BMI. Constitutional Well developed. Well nourished.  Vascular Dorsalis pedis pulses palpable bilaterally. Posterior tibial pulses palpable bilaterally. Capillary refill normal  to all digits.  No cyanosis or clubbing noted. Pedal hair growth normal.  Neurologic Normal speech. Oriented to person, place, and time. Epicritic sensation to light touch grossly present bilaterally.  Dermatologic Nails well groomed and normal in appearance. No open wounds. No skin lesions.  Orthopedic: Normal joint ROM without pain or crepitus bilaterally. No visible deformities. Tender to palpation at the calcaneal tuber right. No pain with calcaneal squeeze right. Ankle ROM diminished range of motion right. Silfverskiold Test: positive right.   Radiographs: Taken and reviewed. No acute fractures or dislocations. No evidence of stress fracture.  Plantar heel spur present. Posterior heel spur  absent.   Assessment:   1. Plantar fasciitis, right    Plan:  Patient was evaluated and treated and all questions answered.  Plantar Fasciitis, right - XR reviewed as above.  - Educated on icing and stretching. Instructions given.  - Injection delivered to the plantar fascia as below. - DME: Plantar Fascial Brace - Pharmacologic management: None  Procedure: Injection Tendon/Ligament Location: Right plantar fascia at the glabrous junction; medial approach. Skin Prep: alcohol Injectate: 0.5 cc 0.5% marcaine plain, 0.5 cc of 1% Lidocaine, 0.5 cc kenalog 10. Disposition: Patient tolerated procedure well. Injection site dressed with a band-aid.  No follow-ups on file.

## 2020-06-27 ENCOUNTER — Other Ambulatory Visit: Payer: Self-pay | Admitting: Podiatry

## 2020-06-27 DIAGNOSIS — M722 Plantar fascial fibromatosis: Secondary | ICD-10-CM

## 2020-06-29 ENCOUNTER — Telehealth: Payer: Self-pay | Admitting: Internal Medicine

## 2020-06-29 NOTE — Telephone Encounter (Signed)
Patient states that she took Paz's  recommendations on cutting out salt ( using No Salt to cook)  and adding exercise. She states that her numbers has dropped and she wanted to know if she should keep taking her meds. She also wants to know what is a too low reading. Please advise  Recent reading On 05/17 127/79  05/18 116/75 05/20  107/74.Marland Kitchen

## 2020-07-03 MED ORDER — AMLODIPINE BESYLATE 2.5 MG PO TABS: 2.5000 mg | ORAL_TABLET | Freq: Every day | ORAL | 1 refills | Status: DC

## 2020-07-03 NOTE — Telephone Encounter (Signed)
Spoke w/ Pt- informed of recommendations, Pt requesting amlodipine 2.5mg  sent to [facility]. She informed BP was 118/81, 94/70 on Saturday. She didn't take her medication on Sunday d/t low numbers and was 147/82. She will drop to amlodipine 2.5mg  daily and will continue to monitor BPs, she will call if they continue to be low.

## 2020-07-03 NOTE — Telephone Encounter (Signed)
Those are great numbers. Okay to cut down amlodipine 5 mg to 1/2 tablet daily. Continue monitoring BPs. If unable to cut amlodipine tablets, okay to send a new prescription for amlodipine 2.5 mg 1 tablet daily.

## 2020-07-16 ENCOUNTER — Other Ambulatory Visit: Payer: Self-pay | Admitting: Internal Medicine

## 2020-07-25 ENCOUNTER — Other Ambulatory Visit: Payer: Self-pay | Admitting: Internal Medicine

## 2020-07-27 ENCOUNTER — Other Ambulatory Visit: Payer: Self-pay

## 2020-07-27 ENCOUNTER — Ambulatory Visit (INDEPENDENT_AMBULATORY_CARE_PROVIDER_SITE_OTHER): Payer: BC Managed Care – PPO | Admitting: Podiatry

## 2020-07-27 DIAGNOSIS — Q666 Other congenital valgus deformities of feet: Secondary | ICD-10-CM | POA: Diagnosis not present

## 2020-07-27 DIAGNOSIS — M722 Plantar fascial fibromatosis: Secondary | ICD-10-CM | POA: Diagnosis not present

## 2020-08-01 ENCOUNTER — Encounter: Payer: Self-pay | Admitting: Podiatry

## 2020-08-01 NOTE — Progress Notes (Signed)
Subjective:  Patient ID: Erin Fox, female    DOB: [birth year],  MRN: [id number]  Chief Complaint  Patient presents with   Plantar Fasciitis    PT stated that she is doing a little better but still having some pain     60 y.o. female presents with the above complaint.  Patient presents with follow-up to right Planter fasciitis.  She states that she is doing a little bit better.  She has had about 50% of pain.  She would like to discuss next treatment plans.  She still having some pain.  She does work about 12-hour shifts.  She denies any other acute complaints.   Review of Systems: Negative except as noted in the HPI. Denies N/V/F/Ch.  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 (Patient not taking: Reported on 01/09/2020), Disp: , Rfl:    fluticasone (FLONASE) 50 MCG/ACT nasal spray, Place 2 sprays into both nostrils daily., Disp: 48 g, Rfl: 3   folic acid (FOLVITE) 1 MG tablet, Take 5 tablets (5 mg total) by mouth daily., Disp: 450 tablet, Rfl: 3   hyoscyamine (LEVSIN SL) 0.125 MG SL tablet, Place 1  tablet (0.125 mg total) under the tongue every 6 (six) hours as needed. (Patient not taking: Reported on 01/09/2020), Disp: 30 tablet, Rfl: 1   PREVIDENT 5000 BOOSTER PLUS 1.1 % PSTE, Place onto teeth., Disp: , Rfl:    simvastatin (ZOCOR) 20 MG tablet, Take 1 tablet (20 mg total) by mouth at bedtime., Disp: 90 tablet, Rfl: 1   SYMBICORT 160-4.5 MCG/ACT inhaler, Inhale 2 puffs into the lungs 2 (two) times daily., Disp: 30.6 g, Rfl: 1  Social History   Tobacco Use  Smoking Status Every Day   Packs/day: 0.50   Years: 30.00   Pack years: 15.00   Types: Cigarettes  Smokeless Tobacco Never  Tobacco Comments   3/4  ppd    Allergies  Allergen Reactions   Latex Rash   Objective:  There were no vitals filed for this visit. There is no height or weight on file to calculate BMI. Constitutional Well developed. Well nourished.  Vascular Dorsalis pedis pulses palpable bilaterally. Posterior tibial pulses palpable bilaterally. Capillary refill normal to all digits.  No cyanosis or clubbing noted. Pedal hair growth normal.  Neurologic Normal speech. Oriented to person, place, and time. Epicritic sensation to light touch grossly present bilaterally.  Dermatologic Nails well groomed and  normal in appearance. No open wounds. No skin lesions.  Orthopedic: Normal joint ROM without pain or crepitus bilaterally. No visible deformities. Tender to palpation at the calcaneal tuber right. No pain with calcaneal squeeze right. Ankle ROM diminished range of motion right. Silfverskiold Test: positive right.   Radiographs: Taken and reviewed. No acute fractures or dislocations. No evidence of stress fracture.  Plantar heel spur present. Posterior heel spur absent.   Assessment:   1. Pes planovalgus   2. Plantar fasciitis, right     Plan:  Patient was evaluated and treated and all questions answered.  Plantar Fasciitis, right - XR reviewed as above.  - Educated on icing and stretching.  Instructions given.  -Second injection delivered to the plantar fascia as below. - DME: Night splint - Pharmacologic management: None  Pes planovalgus -Orthotics were dispensed and are functioning well  Procedure: Injection Tendon/Ligament Location: Right plantar fascia at the glabrous junction; medial approach. Skin Prep: alcohol Injectate: 0.5 cc 0.5% marcaine plain, 0.5 cc of 1% Lidocaine, 0.5 cc kenalog 10. Disposition: Patient tolerated procedure well. Injection site dressed with a band-aid.  No follow-ups on file.

## 2020-08-16 DIAGNOSIS — U071 COVID-19: Secondary | ICD-10-CM | POA: Diagnosis not present

## 2020-08-31 ENCOUNTER — Encounter: Payer: Self-pay | Admitting: Podiatry

## 2020-08-31 ENCOUNTER — Other Ambulatory Visit: Payer: Self-pay

## 2020-08-31 ENCOUNTER — Ambulatory Visit (INDEPENDENT_AMBULATORY_CARE_PROVIDER_SITE_OTHER): Payer: BC Managed Care – PPO | Admitting: Podiatry

## 2020-08-31 DIAGNOSIS — Q666 Other congenital valgus deformities of feet: Secondary | ICD-10-CM

## 2020-08-31 DIAGNOSIS — M722 Plantar fascial fibromatosis: Secondary | ICD-10-CM

## 2020-08-31 NOTE — Progress Notes (Signed)
Subjective:  Patient ID: Erin Fox, female    DOB: December 30, 1960,  MRN: [id number]  Chief Complaint  Patient presents with   Foot Pain    PT stated that she is doing a little better she does still have some soreness     60 y.o. female presents with the above complaint.  Patient presents for follow-up of right plantar fasciitis.  She states she is doing a lot better.  The orthotics are functioning well.  She is able to do her work.  Pain has gone down considerably since the first time she saw me.   Review of Systems: Negative except as noted in the HPI. Denies N/V/F/Ch.  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 (Patient not taking: Reported on 01/09/2020), Disp: , Rfl:    fluticasone (FLONASE) 50 MCG/ACT nasal spray, Place 2 sprays into both nostrils daily., Disp: 48 g, Rfl: 3   folic acid (FOLVITE) 1 MG tablet, Take 5 tablets (5 mg total) by mouth daily., Disp: 450 tablet, Rfl: 3   hyoscyamine (LEVSIN SL) 0.125 MG SL tablet, Place 1 tablet (0.125 mg total) under the tongue every 6 (six) hours as  needed. (Patient not taking: Reported on 01/09/2020), Disp: 30 tablet, Rfl: 1   PREVIDENT 5000 BOOSTER PLUS 1.1 % PSTE, Place onto teeth., Disp: , Rfl:    simvastatin (ZOCOR) 20 MG tablet, Take 1 tablet (20 mg total) by mouth at bedtime., Disp: 90 tablet, Rfl: 1   SYMBICORT 160-4.5 MCG/ACT inhaler, Inhale 2 puffs into the lungs 2 (two) times daily., Disp: 30.6 g, Rfl: 1  Social History   Tobacco Use  Smoking Status Every Day   Packs/day: 0.50   Years: 30.00   Pack years: 15.00   Types: Cigarettes  Smokeless Tobacco Never  Tobacco Comments   3/4  ppd    Allergies  Allergen Reactions   Latex Rash   Objective:  There were no vitals filed for this visit. There is no height or weight on file to calculate BMI. Constitutional Well developed. Well nourished.  Vascular Dorsalis pedis pulses palpable bilaterally. Posterior tibial pulses palpable bilaterally. Capillary refill normal to all digits.  No cyanosis or clubbing noted. Pedal hair growth normal.  Neurologic Normal speech. Oriented to person, place, and time. Epicritic sensation to light touch grossly present bilaterally.  Dermatologic Nails well groomed and normal in appearance. No open wounds. No skin lesions.  Orthopedic:  Normal joint ROM without pain or crepitus bilaterally. No visible deformities. No further tender to palpation at the calcaneal tuber right. No pain with calcaneal squeeze right. Ankle ROM diminished range of motion right. Silfverskiold Test: positive right.   Radiographs: Taken and reviewed. No acute fractures or dislocations. No evidence of stress fracture.  Plantar heel spur present. Posterior heel spur absent.   Assessment:   1. Pes planovalgus   2. Plantar fasciitis, right      Plan:  Patient was evaluated and treated and all questions answered.  Plantar Fasciitis, right -Clinically healed.  Patient is functioning well with orthotics.  Mild adjustment was made to the orthotics.  I  discussed shoe gear modification extensive detail.  If any foot and ankle issues arise in future I will asked her to come see me.  She states understanding  Pes planovalgus -Orthotics were dispensed and are functioning well  .  No follow-ups on file.

## 2020-09-07 ENCOUNTER — Other Ambulatory Visit: Payer: Self-pay | Admitting: Internal Medicine

## 2020-09-07 DIAGNOSIS — Z1231 Encounter for screening mammogram for malignant neoplasm of breast: Secondary | ICD-10-CM

## 2020-09-10 ENCOUNTER — Other Ambulatory Visit: Payer: Self-pay

## 2020-09-10 ENCOUNTER — Ambulatory Visit
Admission: RE | Admit: 2020-09-10 | Discharge: 2020-09-10 | Disposition: A | Discharge status: Home / Self Care | Payer: BC Managed Care – PPO | Source: Ambulatory Visit | Attending: Internal Medicine | Admitting: Internal Medicine

## 2020-09-10 DIAGNOSIS — Z1231 Encounter for screening mammogram for malignant neoplasm of breast: Secondary | ICD-10-CM

## 2020-11-14 ENCOUNTER — Other Ambulatory Visit: Payer: Self-pay

## 2020-11-14 ENCOUNTER — Ambulatory Visit (INDEPENDENT_AMBULATORY_CARE_PROVIDER_SITE_OTHER): Payer: BC Managed Care – PPO | Admitting: Family

## 2020-11-14 ENCOUNTER — Ambulatory Visit (HOSPITAL_BASED_OUTPATIENT_CLINIC_OR_DEPARTMENT_OTHER)
Admission: RE | Admit: 2020-11-14 | Discharge: 2020-11-14 | Disposition: A | Discharge status: Home / Self Care | Payer: BC Managed Care – PPO | Source: Ambulatory Visit | Attending: Family | Admitting: Family

## 2020-11-14 VITALS — BP 136/82 | HR 63 | Temp 98.4°F | Ht 68.0 in | Wt 156.0 lb

## 2020-11-14 DIAGNOSIS — R109 Unspecified abdominal pain: Secondary | ICD-10-CM | POA: Insufficient documentation

## 2020-11-14 DIAGNOSIS — R310 Gross hematuria: Secondary | ICD-10-CM | POA: Diagnosis not present

## 2020-11-14 DIAGNOSIS — I700 Atherosclerosis of aorta: Secondary | ICD-10-CM | POA: Diagnosis not present

## 2020-11-14 DIAGNOSIS — Z90710 Acquired absence of both cervix and uterus: Secondary | ICD-10-CM | POA: Diagnosis not present

## 2020-11-14 DIAGNOSIS — Z23 Encounter for immunization: Secondary | ICD-10-CM

## 2020-11-14 DIAGNOSIS — R8290 Unspecified abnormal findings in urine: Secondary | ICD-10-CM

## 2020-11-14 DIAGNOSIS — N281 Cyst of kidney, acquired: Secondary | ICD-10-CM | POA: Diagnosis not present

## 2020-11-14 LAB — POC URINALSYSI DIPSTICK (AUTOMATED)
Glucose, UA: NEGATIVE
Ketones, UA: NEGATIVE
Leukocytes, UA: NEGATIVE
Nitrite, UA: NEGATIVE
Protein, UA: POSITIVE — AB
Spec Grav, UA: 1.025 (ref 1.010–1.025)
Urobilinogen, UA: 0.2 E.U./dL
pH, UA: 6 (ref 5.0–8.0)

## 2020-11-14 NOTE — Progress Notes (Signed)
Subjective:     Patient ID: Erin Fox, female    DOB: March 03, 1960, 60 y.o.   MRN: [id number]  Chief Complaint  Patient presents with   Back Pain    X3 weeks, around her shoulder blades. Pt states pain occurs at night. Pt states no falls or injury.    Back Pain  Patient is in today for flank pain.  She reports that pain began about 3 weeks ago.  She does not note worsening with movement. She reports that she notices the most at night when her back is tired. Denies dysuria, frequency, blood or history of kidney stone. She has tried tylenol which helped.   She has mild cough ever since she had Covid in June.  She denies hx of reflux or history. She does report a history of intermittent microscopic hematuria.  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   Wears glasses     Past Surgical History:  Procedure Laterality Date   5th toes resected bilateral  1990's   bone's removed   ABDOMINAL HYSTERECTOMY  1984   W/ UNILATERAL SALPINGOOPHORECTOMY   ABDOMINOPLASTY  1990   BREAST BIOPSY Right 01-02-2004   CESAREAN SECTION     COLONOSCOPY W/ POLYPECTOMY  last one 02-14-2014   CYSTO/  RETROGRADE PYELOGRAM/  LEFT URETEROSCOPY/  BLADDER BIOPSY  08-27-2004   CYSTOSCOPY W/ RETROGRADES Bilateral 03/17/2014   Procedure: CYSTOSCOPY WITH RETROGRADE PYELOGRAM;  Surgeon: Alexis Frock, MD;  Location: [facility];   Service: Urology;  Laterality: Bilateral;   CYSTOSCOPY WITH BIOPSY N/A 03/17/2014   Procedure: CYSTOSCOPY WITH BIOPSY AND FULGERATION;  Surgeon: Alexis Frock, MD;  Location: [facility];  Service: Urology;  Laterality: N/A;   OTHER SURGICAL HISTORY Left 2016   Cyst Removal on Left ear   REMOVAL BARTHOLIN CYST/ GLAND AND REVISION LEFT LABIA  06-10-2004    Family History  Problem Relation Age of Onset   Hyperlipidemia Mother    Hypertension Mother        M and sister    Diabetes Father    Colon polyps Sister 46       8 polyps removed   High blood pressure Sister    Cancer Paternal Aunt        lung   Colon cancer Other        uncle , dx at age 44s   Colon cancer Paternal Uncle    Breast cancer Neg Hx     Social History   Socioeconomic History   Marital status: Divorced    Spouse name: Not on file   Number of children: 1   Years of education: Not on file   Highest education level: Not on file  Occupational History   Occupation: Radiation protection practitioner  Employer: [employer]  Tobacco Use   Smoking status: Every Day    Packs/day: 0.50    Years: 30.00    Pack years: 15.00    Types: Cigarettes   Smokeless tobacco: Never   Tobacco comments:    3/4  ppd  Substance and Sexual Activity   Alcohol use: Yes    Alcohol/week: 0.0 standard drinks    Comment: OCCASIONAL   Drug use: Yes    Types: Marijuana    Comment: "every other weekend" per pt.   Sexual activity: Not on file  Other Topics Concern   Not on file  Social History Narrative   Her mother lives w/ her, she is elderly, has memory issues, + stress for [employer]       Social Determinants of Health   Financial Resource Strain: Not on file  Food Insecurity: Not on file  Transportation Needs: Not on file  Physical Activity: Not on file  Stress: Not on file  Social Connections: Not on file  Intimate Partner Violence: Not on file    Outpatient Medications Prior to Visit  Medication Sig Dispense  Refill   acyclovir (ZOVIRAX) 400 MG tablet Take 1 tablet (400 mg total) by mouth 2 (two) times daily as needed. 180 tablet 3   amLODipine (NORVASC) 2.5 MG tablet Take 1 tablet (2.5 mg total) by mouth daily. 90 tablet 1   carvedilol (COREG) 12.5 MG tablet Take 1 tablet (12.5 mg total) by mouth 2 (two) times daily with a meal. 180 tablet 1   Cholecalciferol (VITAMIN D) 50 MCG (2000 UT) tablet Take 2,000 Units by mouth daily.     fluticasone (FLONASE) 50 MCG/ACT nasal spray Place 2 sprays into both nostrils daily. 48 g 3   folic acid (FOLVITE) 1 MG tablet Take 5 tablets (5 mg total) by mouth daily. 450 tablet 3   PREVIDENT 5000 BOOSTER PLUS 1.1 % PSTE Place onto teeth.     simvastatin (ZOCOR) 20 MG tablet Take 1 tablet (20 mg total) by mouth at bedtime. 90 tablet 1   SYMBICORT 160-4.5 MCG/ACT inhaler Inhale 2 puffs into the lungs 2 (two) times daily. 30.6 g 1   hyoscyamine (LEVSIN SL) 0.125 MG SL tablet Place 1 tablet (0.125 mg total) under the tongue every 6 (six) hours as needed. (Patient not taking: Reported on 01/09/2020) 30 tablet 1   No facility-administered medications prior to visit.    Allergies  Allergen Reactions   Latex Rash    Review of Systems  Musculoskeletal:  Positive for back pain.      Objective:    Physical Exam Constitutional:      General: She is not in acute distress.    Appearance: Normal appearance. She is well-developed.  HENT:     Head: Normocephalic and atraumatic.     Right Ear: External ear normal.     Left Ear: External ear normal.  Eyes:     General: No scleral icterus. Neck:     Thyroid: No thyromegaly.  Cardiovascular:     Rate and Rhythm: Normal rate and regular rhythm.     Heart sounds: Normal heart sounds. No murmur heard. Pulmonary:     Effort: Pulmonary effort is normal. No respiratory distress.     Breath sounds: Normal breath sounds. No wheezing.  Abdominal:     General: Bowel sounds are normal.     Palpations: Abdomen is soft.      Tenderness: There is no abdominal tenderness.     Comments: +  hernia above umbilicus on right, partially reducible, non tender  Musculoskeletal:     Cervical back: Neck supple.     Comments: No reproducible tenderness on back  Skin:    General: Skin is warm and dry.  Neurological:     Mental Status: She is alert and oriented to person, place, and time.  Psychiatric:        Mood and Affect: Mood normal.        Behavior: Behavior normal.        Thought Content: Thought content normal.        Judgment: Judgment normal.    BP 136/82 (BP Location: Left Arm, Patient Position: Sitting, Cuff Size: Normal)   Pulse 63   Temp 98.4 F (36.9 C) (Oral)   Ht 5\' 8"  (1.727 m)   Wt 156 lb (70.8 kg)   HC 18" (45.7 cm)   SpO2 97%   BMI 23.72 kg/m  Wt Readings from Last 3 Encounters:  11/14/20 156 lb (70.8 kg)  01/09/20 153 lb 6.4 oz (69.6 kg)  01/05/19 154 lb 8 oz (70.1 kg)       Assessment & Plan:   Problem List Items Addressed This Visit       Unprioritized   Flank pain    3 week history.  Large blood in urinalysis.  Needs evaluation for kidney stone.  CT renal stone protocol was obtained and was negative or kidney stone. Chart review reveals that she has had negative work up with Dr. Tresa Moore for hematuria and that he only recommends repeat cysto if she develops gross hematuria. Since CT is negative for stone, then musculoskeletal pain is next most likely cause. I will send an rx for meloxicam for her to try.       Relevant Orders   POCT Urinalysis Dipstick (Automated) (Completed)   Other Visit Diagnoses     Need for influenza vaccination    -  Primary   Abdominal pain, unspecified abdominal location       Relevant Orders   CT RENAL STONE STUDY (Completed)   Abnormal urine finding       Relevant Orders   Urine Culture       I have discontinued Elyana L. Veno's hyoscyamine. I am also having her start on meloxicam. Additionally, I am having her maintain her Vitamin D,  fluticasone, folic acid, PreviDent 2229 Booster Plus, amLODipine, acyclovir, simvastatin, carvedilol, and Symbicort.  Meds ordered this encounter  Medications   meloxicam (MOBIC) 7.5 MG tablet    Sig: Take 1 tablet (7.5 mg total) by mouth daily.    Dispense:  14 tablet    Refill:  0    Order Specific Question:   Supervising Provider    Answer:   Penni Homans A [[id number]]

## 2020-11-14 NOTE — Assessment & Plan Note (Addendum)
3 week history.  Large blood in urinalysis.  Needs evaluation for kidney stone.  CT renal stone protocol was obtained and was negative or kidney stone. Chart review reveals that she has had negative work up with Dr. Tresa Moore for hematuria and that he only recommends repeat cysto if she develops gross hematuria. Since CT is negative for stone, then musculoskeletal pain is next most likely cause. I will send an rx for meloxicam for her to try.

## 2020-11-14 NOTE — Patient Instructions (Signed)
Please complete CT scan on the first floor.  We will contact you with your results.

## 2020-11-15 ENCOUNTER — Telehealth: Payer: Self-pay | Admitting: Family

## 2020-11-15 LAB — URINE CULTURE
MICRO NUMBER:: 12464436
SPECIMEN QUALITY:: ADEQUATE

## 2020-11-15 MED ORDER — MELOXICAM 7.5 MG PO TABS: 7.5000 mg | ORAL_TABLET | Freq: Every day | ORAL | 0 refills | Status: DC

## 2020-11-15 NOTE — Telephone Encounter (Signed)
See mychart.  

## 2020-12-13 ENCOUNTER — Encounter: Payer: Self-pay | Admitting: Gastroenterology

## 2020-12-31 ENCOUNTER — Other Ambulatory Visit: Payer: Self-pay

## 2020-12-31 MED ORDER — CARVEDILOL 12.5 MG PO TABS
12.5000 mg | ORAL_TABLET | Freq: Two times a day (BID) | ORAL | 1 refills | Status: DC
Start: 2020-12-31 — End: 2021-05-22

## 2021-01-09 ENCOUNTER — Encounter: Payer: BC Managed Care – PPO | Admitting: Internal Medicine

## 2021-02-08 ENCOUNTER — Other Ambulatory Visit: Payer: Self-pay

## 2021-02-08 ENCOUNTER — Ambulatory Visit (AMBULATORY_SURGERY_CENTER): Payer: BC Managed Care – PPO | Admitting: *Deleted

## 2021-02-08 VITALS — Ht 67.5 in | Wt 150.0 lb

## 2021-02-08 DIAGNOSIS — Z86010 Personal history of colonic polyps: Secondary | ICD-10-CM

## 2021-02-08 MED ORDER — NA SULFATE-K SULFATE-MG SULF 17.5-3.13-1.6 GM/177ML PO SOLN: 1.0000 | Freq: Once | ORAL | 0 refills | Status: AC

## 2021-02-08 NOTE — Progress Notes (Signed)
PV completed over the phone. Pt verified name, DOB, address and insurance during PV today.  Pt mailed instruction packet with copy of consent form to read and not return, and instructions.   Pt encouraged to call with questions or issues.  If pt has My chart, procedure instructions sent via My Chart   No egg or soy allergy known to patient  No issues known to pt with past sedation with any surgeries or procedures- with one surgery woke and was unaware, stood up and fell  Patient denies ever being told they had issues or difficulty with intubation  No FH of Malignant Hyperthermia Pt is not on diet pills Pt is not on  home 02  Pt is not on blood thinners  Pt denies issues with constipation  No A fib or A flutter  Pt is fully vaccinated  for Covid    NO PA's for preps discussed with pt In PV today  Discussed with pt there will be an out-of-pocket cost for prep and that varies from $0 to 70 +  dollars - pt verbalized understanding   Due to the COVID-19 pandemic we are asking patients to follow certain guidelines in PV and the [employer]   Pt aware of COVID protocols and [facility] guidelines

## 2021-02-18 ENCOUNTER — Encounter: Payer: Self-pay | Admitting: Gastroenterology

## 2021-02-22 ENCOUNTER — Encounter: Payer: Self-pay | Admitting: Gastroenterology

## 2021-02-22 ENCOUNTER — Ambulatory Visit (AMBULATORY_SURGERY_CENTER): Payer: BC Managed Care – PPO | Admitting: Gastroenterology

## 2021-02-22 VITALS — BP 156/95 | HR 54 | Temp 98.0°F | Resp 12 | Ht 68.0 in | Wt 153.0 lb

## 2021-02-22 DIAGNOSIS — Z1211 Encounter for screening for malignant neoplasm of colon: Secondary | ICD-10-CM | POA: Diagnosis not present

## 2021-02-22 DIAGNOSIS — K635 Polyp of colon: Secondary | ICD-10-CM | POA: Diagnosis not present

## 2021-02-22 DIAGNOSIS — D125 Benign neoplasm of sigmoid colon: Secondary | ICD-10-CM

## 2021-02-22 DIAGNOSIS — Z86010 Personal history of colonic polyps: Secondary | ICD-10-CM

## 2021-02-22 DIAGNOSIS — D123 Benign neoplasm of transverse colon: Secondary | ICD-10-CM

## 2021-02-22 DIAGNOSIS — D120 Benign neoplasm of cecum: Secondary | ICD-10-CM | POA: Diagnosis not present

## 2021-02-22 DIAGNOSIS — D122 Benign neoplasm of ascending colon: Secondary | ICD-10-CM

## 2021-02-22 MED ORDER — SODIUM CHLORIDE 0.9 % IV SOLN: 500.0000 mL | Freq: Once | INTRAVENOUS | Status: DC

## 2021-02-22 NOTE — Progress Notes (Signed)
History and Physical:  This patient presents for endoscopic testing for: Encounter Diagnosis  Name Primary?   Personal history of colonic polyps Yes    TA on 2010 colonoscopy, none in 2015 Prior IHR banding Patient denies chronic abdominal pain, rectal bleeding, constipation or diarrhea.   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   Wears glasses      Past Surgical History: Past Surgical History:  Procedure Laterality Date   5th toes resected bilateral  1990's   bone's removed   ABDOMINAL HYSTERECTOMY  1984   W/ UNILATERAL SALPINGOOPHORECTOMY   ABDOMINOPLASTY  1990   BREAST BIOPSY Right 01/02/2004   CESAREAN SECTION     COLONOSCOPY     COLONOSCOPY W/ POLYPECTOMY  last one 02-14-2014   CYSTO/  RETROGRADE PYELOGRAM/  LEFT URETEROSCOPY/  BLADDER BIOPSY  08/27/2004   CYSTOSCOPY W/ RETROGRADES Bilateral 03/17/2014   Procedure: CYSTOSCOPY WITH RETROGRADE PYELOGRAM;  Surgeon: Alexis Frock, MD;  Location[facility];  Service: Urology;  Laterality: Bilateral;   CYSTOSCOPY WITH BIOPSY N/A 03/17/2014   Procedure: CYSTOSCOPY WITH BIOPSY AND FULGERATION;  Surgeon: Alexis Frock, MD;  Location: [facility];  Service: Urology;  Laterality: N/A;   OTHER SURGICAL HISTORY Left 2016   Cyst Removal on Left ear   POLYPECTOMY     REMOVAL BARTHOLIN CYST/ GLAND AND REVISION LEFT LABIA  06/10/2004     Allergies: Allergies  Allergen Reactions   Latex Rash    Outpatient Meds: Current Outpatient Medications  Medication Sig Dispense Refill   amLODipine (NORVASC) 2.5 MG tablet Take 1 tablet (2.5 mg total) by mouth daily. 90 tablet 1   carvedilol (COREG) 12.5 MG tablet Take 1 tablet (12.5 mg total) by mouth 2 (two) times daily with a meal. 180 tablet 1   Cholecalciferol (VITAMIN D) 50 MCG (2000 UT) tablet Take 2,000 Units by mouth daily.     fluticasone (FLONASE) 50 MCG/ACT nasal spray Place 2 sprays into both nostrils daily. 48 g 3   folic acid (FOLVITE) 1 MG tablet Take 5 tablets (5 mg total) by mouth daily. 450 tablet 3   simvastatin (ZOCOR) 20 MG tablet Take 1 tablet (20 mg total) by mouth at bedtime. 90 tablet 1   SYMBICORT 160-4.5 MCG/ACT inhaler Inhale 2 puffs into the lungs 2 (two) times daily. 30.6 g 1   acyclovir (ZOVIRAX) 400 MG tablet Take 1 tablet (400 mg total) by mouth 2 (two) times daily as needed. 180 tablet 3   PREVIDENT 5000 BOOSTER PLUS 1.1 % PSTE Place onto teeth.     Current Facility-Administered Medications  Medication Dose Route Frequency Provider Last Rate Last Admin   0.9 %  sodium chloride infusion  500 mL Intravenous Once Doran Stabler, MD  ___________________________________________________________________ Objective   Exam:  BP 128/87    Pulse 63    Temp 98 F (36.7 C)    Resp (!) 97    Ht 5\' 8"  (1.727 m)    Wt 153 lb (69.4 kg)    BMI 23.26 kg/m   CV: RRR without murmur, S1/S2 Resp: clear to auscultation bilaterally, normal RR and effort noted GI: soft, no tenderness, with active bowel sounds.   Assessment: Encounter Diagnosis  Name Primary?   Personal history of colonic polyps Yes     Plan: Colonoscopy  The benefits and risks of the planned procedure were described in detail with the patient or (when appropriate) their health care proxy.  Risks were outlined as including, but not limited to, bleeding, infection,  perforation, adverse medication reaction leading to cardiac or pulmonary decompensation, pancreatitis (if ERCP).  The limitation of incomplete mucosal visualization was also discussed.  No guarantees or warranties were given.    The patient is appropriate for an endoscopic procedure in the ambulatory setting.   - Wilfrid Lund, MD

## 2021-02-22 NOTE — Progress Notes (Signed)
VSS, Transported to PACU °

## 2021-02-22 NOTE — Op Note (Addendum)
[place] Patient Name: Erin Fox Procedure Date: 02/22/2021 10:31 AM MRN: [id number] Endoscopist: Bacon. Loletha Carrow , MD Age: 61 Referring MD:  Date of Birth: [birth year] Gender: Female Account #: [id number] Procedure:                Colonoscopy Indications:              Increased risk colon cancer surveillance: Personal                            history of adenoma (10 mm or greater in size)                           >45mm TA in 2010, no polyps in 2015 Medicines:                Monitored Anesthesia Care Procedure:                Pre-Anesthesia Assessment:                           - Prior to the procedure, a History and Physical                            was performed, and patient medications and                            allergies were reviewed. The patient's tolerance of                            previous anesthesia was also reviewed. The risks                            and benefits of the procedure and the sedation                            options and risks were discussed with the patient.                            All questions were answered, and informed consent                            was obtained. Prior Anticoagulants: The patient has                            taken no previous anticoagulant or antiplatelet                            agents. ASA Grade Assessment: II - A patient with                            mild systemic disease. After reviewing the risks                            and benefits, the patient was deemed in  satisfactory condition to undergo the procedure.                           After obtaining informed consent, the colonoscope                            was passed under direct vision. Throughout the                            procedure, the patient's blood pressure, pulse, and                            oxygen saturations were monitored continuously. The                            CF [id number] #[id number] was  introduced through the anus                            and advanced to the the cecum, identified by                            appendiceal orifice and ileocecal valve. The                            colonoscopy was performed with difficulty due to a                            redundant colon and significant looping. Successful                            completion of the procedure was aided by using                            manual pressure and straightening and shortening                            the scope to obtain bowel loop reduction. The                            patient tolerated the procedure well. The quality                            of the bowel preparation was good. The ileocecal                            valve, appendiceal orifice, and rectum were                            photographed. Scope In: 10:38:50 AM Scope Out: 11:21:15 AM Scope Withdrawal Time: 0 hours 35 minutes 20 seconds  Total Procedure Duration: 0 hours 42 minutes 25 seconds  Findings:                 The perianal and digital rectal examinations were  normal.                           A 1 mm polyp was found in the cecum. The polyp was                            semi-sessile. The polyp was removed with a cold                            biopsy forceps. Resection and retrieval were                            complete.                           Two sessile polyps were found in the cecum. The                            polyps were 2 to 4 mm in size. These polyps were                            removed with a cold snare. Resection and retrieval                            were complete.                           A 20-25 mm polyp was found in the proximal                            ascending colon. The polyp was flat. The polyp was                            removed with a piecemeal technique using a cold                            snare. Resection and retrieval were complete.                            A 40 mm polyp was found in the proximal transverse                            colon. The polyp was flat. Area was tattooed with                            an injection of 0.5 mL of Spot (carbon black).                           A 5 mm polyp was found in the sigmoid colon. The                            polyp was semi-sessile. The polyp was removed with  a cold snare. Resection and retrieval were complete.                           Multiple diverticula were found in the left colon                            and right colon.                           Internal hemorrhoids were found.                           The exam was otherwise without abnormality on                            direct and retroflexion views. Complications:            No immediate complications. Estimated Blood Loss:     Estimated blood loss was minimal. Impression:               - One 1 mm polyp in the cecum, removed with a cold                            biopsy forceps. Resected and retrieved.                           - Two 2 to 4 mm polyps in the cecum, removed with a                            cold snare. Resected and retrieved.                           - One 20 mm polyp in the proximal ascending colon,                            removed piecemeal using a cold snare. Resected and                            retrieved.                           - One 40 mm polyp in the proximal transverse colon.                            Tattooed.                           - One 5 mm polyp in the sigmoid colon, removed with                            a cold snare. Resected and retrieved.                           - Diverticulosis in the left colon and in the right  colon.                           - Internal hemorrhoids.                           - The examination was otherwise normal on direct                            and retroflexion views. Recommendation:            - Patient has a contact number available for                            emergencies. The signs and symptoms of potential                            delayed complications were discussed with the                            patient. Return to normal activities tomorrow.                            Written discharge instructions were provided to the                            patient.                           - Resume previous diet.                           - Continue present medications.                           - Repeat colonoscopy in 3 months EMR large polyp                            and re-inspection of prox. ac polypectomy site. Deeann Servidio L. Loletha Carrow, MD 02/22/2021 11:31:55 AM This report has been signed electronically.

## 2021-02-22 NOTE — Patient Instructions (Signed)
Resume previous diet and medications. Repeat Colonoscopy in 3 months EMR large polyp and re-inspection of prox. Ac polypectomy site.  YOU HAD AN ENDOSCOPIC PROCEDURE TODAY AT [facility]:   Refer to the procedure report that was given to you for any specific questions about what was found during the examination.  If the procedure report does not answer your questions, please call your gastroenterologist to clarify.  If you requested that your care partner not be given the details of your procedure findings, then the procedure report has been included in a sealed envelope for you to review at your convenience later.  YOU SHOULD EXPECT: Some feelings of bloating in the abdomen. Passage of more gas than usual.  Walking can help get rid of the air that was put into your GI tract during the procedure and reduce the bloating. If you had a lower endoscopy (such as a colonoscopy or flexible sigmoidoscopy) you may notice spotting of blood in your stool or on the toilet paper. If you underwent a bowel prep for your procedure, you may not have a normal bowel movement for a few days.  Please Note:  You might notice some irritation and congestion in your nose or some drainage.  This is from the oxygen used during your procedure.  There is no need for concern and it should clear up in a day or so.  SYMPTOMS TO REPORT IMMEDIATELY:  Following lower endoscopy (colonoscopy or flexible sigmoidoscopy):  Excessive amounts of blood in the stool  Significant tenderness or worsening of abdominal pains  Swelling of the abdomen that is new, acute  Fever of 100F or higher  For urgent or emergent issues, a gastroenterologist can be reached at any hour by calling [phone]. Do not use MyChart messaging for urgent concerns.    DIET:  We do recommend a small meal at first, but then you may proceed to your regular diet.  Drink plenty of fluids but you should avoid alcoholic beverages for 24  hours.  ACTIVITY:  You should plan to take it easy for the rest of today and you should NOT DRIVE or use heavy machinery until tomorrow (because of the sedation medicines used during the test).    FOLLOW UP: Our staff will call the number listed on your records 48-72 hours following your procedure to check on you and address any questions or concerns that you may have regarding the information given to you following your procedure. If we do not reach you, we will leave a message.  We will attempt to reach you two times.  During this call, we will ask if you have developed any symptoms of COVID 19. If you develop any symptoms (ie: fever, flu-like symptoms, shortness of breath, cough etc.) before then, please call [phone].  If you test positive for Covid 19 in the 2 weeks post procedure, please call and report this information to [place].    If any biopsies were taken you will be contacted by phone or by letter within the next 1-3 weeks.  Please call us at [phone] if you have not heard about the biopsies in 3 weeks.    SIGNATURES/CONFIDENTIALITY: You and/or your care partner have signed paperwork which will be entered into your electronic medical record.  These signatures attest to the fact that that the information above on your After Visit Summary has been reviewed and is understood.  Full responsibility of the confidentiality of this discharge information lies with you and/or your  care-partner.

## 2021-02-22 NOTE — Progress Notes (Signed)
Called to room to assist during endoscopic procedure.  Patient ID and intended procedure confirmed with present staff. Received instructions for my participation in the procedure from the performing physician.  

## 2021-02-26 ENCOUNTER — Ambulatory Visit (INDEPENDENT_AMBULATORY_CARE_PROVIDER_SITE_OTHER): Payer: BC Managed Care – PPO | Admitting: Internal Medicine

## 2021-02-26 ENCOUNTER — Telehealth: Payer: Self-pay

## 2021-02-26 ENCOUNTER — Encounter: Payer: Self-pay | Admitting: Internal Medicine

## 2021-02-26 VITALS — BP 126/76 | HR 59 | Temp 98.5°F | Resp 16 | Ht 68.0 in | Wt 158.1 lb

## 2021-02-26 DIAGNOSIS — Z23 Encounter for immunization: Secondary | ICD-10-CM | POA: Diagnosis not present

## 2021-02-26 DIAGNOSIS — E785 Hyperlipidemia, unspecified: Secondary | ICD-10-CM | POA: Diagnosis not present

## 2021-02-26 DIAGNOSIS — Z0000 Encounter for general adult medical examination without abnormal findings: Secondary | ICD-10-CM

## 2021-02-26 DIAGNOSIS — I10 Essential (primary) hypertension: Secondary | ICD-10-CM | POA: Diagnosis not present

## 2021-02-26 DIAGNOSIS — E559 Vitamin D deficiency, unspecified: Secondary | ICD-10-CM

## 2021-02-26 DIAGNOSIS — E538 Deficiency of other specified B group vitamins: Secondary | ICD-10-CM | POA: Diagnosis not present

## 2021-02-26 DIAGNOSIS — R739 Hyperglycemia, unspecified: Secondary | ICD-10-CM | POA: Diagnosis not present

## 2021-02-26 LAB — CBC WITH DIFFERENTIAL/PLATELET
Basophils Absolute: 0 10*3/uL (ref 0.0–0.1)
Basophils Relative: 1 % (ref 0.0–3.0)
Eosinophils Absolute: 0.1 10*3/uL (ref 0.0–0.7)
Eosinophils Relative: 1.8 % (ref 0.0–5.0)
HCT: 39.3 % (ref 36.0–46.0)
Hemoglobin: 13 g/dL (ref 12.0–15.0)
Lymphocytes Relative: 19.8 % (ref 12.0–46.0)
Lymphs Abs: 0.9 10*3/uL (ref 0.7–4.0)
MCHC: 33.2 g/dL (ref 30.0–36.0)
MCV: 98.9 fl (ref 78.0–100.0)
Monocytes Absolute: 0.5 10*3/uL (ref 0.1–1.0)
Monocytes Relative: 11.6 % (ref 3.0–12.0)
Neutro Abs: 3 10*3/uL (ref 1.4–7.7)
Neutrophils Relative %: 65.8 % (ref 43.0–77.0)
Platelets: 226 10*3/uL (ref 150.0–400.0)
RBC: 3.98 Mil/uL (ref 3.87–5.11)
RDW: 14.5 % (ref 11.5–15.5)
WBC: 4.6 10*3/uL (ref 4.0–10.5)

## 2021-02-26 LAB — COMPREHENSIVE METABOLIC PANEL
ALT: 17 U/L (ref 0–35)
AST: 18 U/L (ref 0–37)
Albumin: 4.3 g/dL (ref 3.5–5.2)
Alkaline Phosphatase: 66 U/L (ref 39–117)
BUN: 10 mg/dL (ref 6–23)
CO2: 33 mEq/L — ABNORMAL HIGH (ref 19–32)
Calcium: 9.2 mg/dL (ref 8.4–10.5)
Chloride: 105 mEq/L (ref 96–112)
Creatinine, Ser: 0.69 mg/dL (ref 0.40–1.20)
GFR: 95.27 mL/min (ref >60.00)
Glucose, Bld: 92 mg/dL (ref 70–99)
Potassium: 4 mEq/L (ref 3.5–5.1)
Sodium: 144 mEq/L (ref 135–145)
Total Bilirubin: 0.7 mg/dL (ref 0.2–1.2)
Total Protein: 6.7 g/dL (ref 6.0–8.3)

## 2021-02-26 LAB — LIPID PANEL
Cholesterol: 219 mg/dL — ABNORMAL HIGH (ref 0–200)
HDL: 103.6 mg/dL (ref >39.00)
LDL Cholesterol: 96 mg/dL (ref 0–99)
NonHDL: 115.07
Total CHOL/HDL Ratio: 2
Triglycerides: 95 mg/dL (ref 0.0–149.0)
VLDL: 19 mg/dL (ref 0.0–40.0)

## 2021-02-26 LAB — HEMOGLOBIN A1C: Hgb A1c MFr Bld: 5.7 % (ref 4.6–6.5)

## 2021-02-26 LAB — TSH: TSH: 2.24 u[IU]/mL (ref 0.35–5.50)

## 2021-02-26 LAB — VITAMIN D 25 HYDROXY (VIT D DEFICIENCY, FRACTURES): VITD: 54.52 ng/mL (ref 30.00–100.00)

## 2021-02-26 LAB — FOLATE: Folate: >24.2 ng/mL (ref >5.9)

## 2021-02-26 NOTE — Progress Notes (Signed)
Subjective:    Patient ID: Erin Fox, female    DOB: 1961-01-02, 61 y.o.   MRN: [id number]  DOS:  02/26/2021 Type of visit - description: CPX  Since the last office visit is doing well. Did have COVID June-2022, she recuperated well. Subsequently had a cold/URI, had cough and wheezing.  Was Rx Symbicort which she is doing. Currently asymptomatic.   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;  Service: Urology;  Laterality: Bilateral;   CYSTOSCOPY WITH BIOPSY N/A 03/17/2014   Procedure: CYSTOSCOPY WITH BIOPSY AND FULGERATION;  Surgeon: Alexis Frock, MD;  Location: [facility];  Service: Urology;  Laterality: N/A;   OTHER SURGICAL HISTORY Left 2016   Cyst Removal on Left ear   POLYPECTOMY     REMOVAL  BARTHOLIN CYST/ GLAND AND REVISION LEFT LABIA  06/10/2004   Social History   Socioeconomic History   Marital status: Divorced    Spouse name: Not on file   Number of children: 1   Years of education: Not on file   Highest education level: Not on file  Occupational History   Occupation: Radiation protection practitioner    Employer: [employer]  Tobacco Use   Smoking status: Every Day    Packs/day: 0.50    Years: 30.00    Pack years: 15.00    Types: Cigarettes   Smokeless tobacco: Never   Tobacco comments:    3/4  ppd  Substance and Sexual Activity   Alcohol use: Yes    Alcohol/week: 0.0 standard drinks    Comment: OCCASIONAL   Drug use: Yes    Types: Marijuana    Comment: at night  Last smoked marajuana 02/21/21   Sexual activity: Not on file  Other Topics Concern   Not on file  Social History Narrative   Her mother lives w/ her, she is elderly, has memory issues, + stress for [employer]   Financial Resource Strain: Not on file  Food Insecurity: Not on file  Transportation Needs: Not on file  Physical Activity: Not on file  Stress: Not  on file  Social Connections: Not on file  Intimate Partner Violence: Not on file    Current Outpatient Medications  Medication Instructions   acyclovir (ZOVIRAX) 400 mg, Oral, 2 times daily PRN   amLODipine (NORVASC) 2.5 mg, Oral, Daily   carvedilol (COREG) 12.5 mg, Oral, 2 times daily with meals   fluticasone (FLONASE) 50 MCG/ACT nasal spray 2 sprays, Each Nare, Daily   folic acid (FOLVITE) 5 mg, Oral, Daily   PREVIDENT 5000 BOOSTER PLUS 1.1 % PSTE dental   simvastatin (ZOCOR) 20 mg, Oral, Daily at bedtime   SYMBICORT 160-4.5 MCG/ACT inhaler 2 puffs, Inhalation, 2 times daily   Vitamin D 2,000 Units, Oral, Daily       Objective:   Physical Exam BP 126/76 (BP Location: Left Arm, Patient Position: Sitting, Cuff Size: Small)    Pulse (!) 59    Temp 98.5 F (36.9 C) (Oral)    Resp 16    Ht 5\' 8"   (1.727 m)    Wt 158 lb 2 oz (71.7 kg)    SpO2 96%    BMI 24.04 kg/m  General: Well developed, NAD, BMI noted Neck: No  thyromegaly  HEENT:  Normocephalic . Face symmetric, atraumatic Lungs:  CTA B Normal respiratory effort, no intercostal retractions, no accessory muscle use. Heart: RRR,  no murmur.  Abdomen:  Not distended, soft, non-tender. No rebound or rigidity.   Lower extremities: no pretibial edema bilaterally  Skin: Exposed areas without rash. Not pale. Not jaundice Neurologic:  alert & oriented X3.  Speech normal, gait appropriate for age and unassisted Strength symmetric and appropriate for age.  Psych: Cognition and judgment appear intact.  Cooperative with normal attention span and concentration.  Behavior appropriate. No anxious or depressed appearing.     Assessment    assessment HTN: Onset 07-2016 Anxiety, insomnia Hyperlipidemia Reacive airway DZ dx 2019 HSV L leg edema,Venous insufficiency, since the 80s after she had her child. Over active bladder, s/p cysto   >1, 2011, 2016, etc, see urology notes  Non-obstructive nephrolithiasis Hemorrhoids- banding x 3 2020 IBS? Sees dermatology regularly Mild folic acid deficiency (labs 01/2020).  PLAN Here for CPX. HTN: Seems well controlled on amlodipine, carvedilol.  Check labs High cholesterol: On simvastatin.  Labs Vitamin D deficiency: On 2000 units daily.  Labs Folic acid deficiency: On supplements, rechecking blood work. Reactive airway disease: Had a URI in November, + wheezing, cough, went to urgent care.  Currently on Symbicort.  Feeling great except for minimal cough. Recommend to wean off Symbicort gradually, see instructions. RTC 1 year    This visit occurred during the SARS-CoV-2 public health emergency.  Safety protocols were in place, including screening questions prior to the visit, additional usage of staff PPE, and extensive cleaning of exam room while observing appropriate contact time as  indicated for disinfecting solutions.

## 2021-02-26 NOTE — Patient Instructions (Addendum)
Schedule a nurse visit in 2 to 4 months from today to get your second shingles shot.  You can wean off Symbicort: Decrease to 1 puff twice daily for a month then stop. You can use it as needed if you have wheezing or chest congestion  Check the  blood pressure regularly BP GOAL is between 110/65 and  135/85. If it is consistently higher or lower, let me know    GO TO THE LAB : Get the blood work     [place], [place] back for a physical exam in 1 year     "Living will", "[place] of attorney": Advanced care planning  (If you already have a living will or healthcare power of attorney, please bring the copy to be scanned in your chart.)  Advance care planning is a process that supports adults in  understanding and sharing their preferences regarding future medical care.   The patient's preferences are recorded in documents called Advance Directives.    Advanced directives are completed (and can be modified at any time) while the patient is in full mental capacity.   The documentation should be available at all times to the patient, the family and the healthcare providers.  Bring in a copy to be scanned in your chart is an excellent idea and is recommended   This legal documents direct treatment decision making and/or appoint a surrogate to make the decision if the patient is not capable to do so.    Advance directives can be documented in many types of formats,  documents have names such as:  Lliving will  Durable power of attorney for healthcare (healthcare proxy or healthcare power of attorney)  Combined directives  Physician orders for life-sustaining treatment    More information at:  [email or web address]

## 2021-02-26 NOTE — Assessment & Plan Note (Signed)
Here for CPX. HTN: Seems well controlled on amlodipine, carvedilol.  Check labs High cholesterol: On simvastatin.  Labs Vitamin D deficiency: On 2000 units daily.  Labs Folic acid deficiency: On supplements, rechecking blood work. Reactive airway disease: Had a URI in November, + wheezing, cough, went to urgent care.  Currently on Symbicort.  Feeling great except for minimal cough. Recommend to wean off Symbicort gradually, see instructions. RTC 1 year

## 2021-02-26 NOTE — Assessment & Plan Note (Signed)
--  Td 2016  - shingrex: Likes to proceed w/ first dose today,  next in 2 to 4 months. -COVID-vaccine: Up-to-date. - s/p flu shot   -- h/o hysterectomy for benign reasons,saw gyn Dr Mancel Bale, had a Pap 02/01/2018 ([facility]); plans to see her again  --MMG 09-2020 ([facility]) --CCS: Cscope 11/ 2010: Polyps, diverticula, hemorrhoids. Cscope 1-16, no polyps, next 7 years.  C-scope 02/22/2021, next per GI. --Tobacco abuse: Smoking 3/4 ppd, still is not ready to quit, likes to "wean-off"; d/w pt nicotine supplements-meds --Diet and exercise: Doing well --Labs: CMP, FLP, CBC, A1c, TSH, vitamin D, folic acid -ACP info provided

## 2021-02-26 NOTE — Telephone Encounter (Signed)
°  Follow up Call-  Call back number 02/22/2021  Post procedure Call Back phone  # [phone]  Permission to leave phone message Yes  Some recent data might be hidden     Patient questions:  Do you have a fever, pain , or abdominal swelling? No. Pain Score  0 *  Have you tolerated food without any problems? Yes.    Have you been able to return to your normal activities? Yes.    Do you have any questions about your discharge instructions: Diet   No. Medications  No. Follow up visit  No.  Do you have questions or concerns about your Care? No.  Actions: * If pain score is 4 or above: No action needed, pain <4.  Have you developed a fever since your procedure? no  2.   Have you had an respiratory symptoms (SOB or cough) since your procedure? no  3.   Have you tested positive for COVID 19 since your procedure no  4.   Have you had any family members/close contacts diagnosed with the COVID 19 since your procedure?  no   If yes to any of these questions please route to Joylene John, RN and Joella Prince, RN

## 2021-02-27 MED ORDER — FOLIC ACID 1 MG PO TABS: 5.0000 mg | ORAL_TABLET | Freq: Every day | ORAL | 3 refills | Status: DC

## 2021-02-27 NOTE — Addendum Note (Signed)
Addended byDamita Dunnings D on: 02/27/2021 01:09 PM   Modules accepted: Orders

## 2021-03-05 ENCOUNTER — Encounter: Payer: Self-pay | Admitting: Gastroenterology

## 2021-03-05 ENCOUNTER — Telehealth: Payer: Self-pay

## 2021-03-05 NOTE — Telephone Encounter (Signed)
Schedule for a colonoscopy at hospital for EMR of large polyp.

## 2021-03-05 NOTE — Telephone Encounter (Signed)
-----   Message from Doran Stabler, MD sent at 03/05/2021  4:35 PM EST ----- Thanks very much, Gabe.  Patient is agreeable to having her procedure with you, sending the following reply to my portal message inquiry: "That will be fine I work from  4 to 12 midnight if I miss her call I will return the call, Thank You.  Erin Fox "  _______________  Chong Sicilian, please contact her with an available hospital outpatient procedure date in April.  HD

## 2021-03-05 NOTE — Progress Notes (Signed)
HD, No worries. Chong Sicilian, will work on getting her set up. Thanks. GM

## 2021-03-06 NOTE — Telephone Encounter (Signed)
The pt will be contacted when the April schedule is out

## 2021-03-27 ENCOUNTER — Other Ambulatory Visit: Payer: Self-pay

## 2021-03-27 ENCOUNTER — Telehealth: Payer: Self-pay

## 2021-03-27 DIAGNOSIS — Z86010 Personal history of colonic polyps: Secondary | ICD-10-CM

## 2021-03-27 MED ORDER — PEG 3350-KCL-NA BICARB-NACL 420 G PO SOLR: 4000.0000 mL | Freq: Once | ORAL | 0 refills | Status: AC

## 2021-03-27 NOTE — Telephone Encounter (Signed)
The pt returned call- colon EMR has been scheduled on 05/23/21 at 830 am at [facility].   Colon EMR scheduled, pt instructed and medications reviewed.  Patient instructions mailed to home and sent to My Chart.  Patient to call with any questions or concerns.

## 2021-03-27 NOTE — Telephone Encounter (Signed)
-----   Message from Timothy Lasso, RN sent at 03/06/2021  4:27 PM EST ----- Erin Fox, please contact her with an available hospital outpatient procedure date in April.

## 2021-03-27 NOTE — Telephone Encounter (Signed)
Patient returned call

## 2021-03-27 NOTE — Telephone Encounter (Signed)
colonoscopy at hospital for EMR of large polyp

## 2021-03-27 NOTE — Telephone Encounter (Signed)
Left message on machine to call back  

## 2021-04-03 ENCOUNTER — Ambulatory Visit (INDEPENDENT_AMBULATORY_CARE_PROVIDER_SITE_OTHER): Payer: BC Managed Care – PPO | Admitting: Internal Medicine

## 2021-04-03 ENCOUNTER — Encounter: Payer: Self-pay | Admitting: Internal Medicine

## 2021-04-03 VITALS — BP 132/86 | HR 68 | Temp 98.5°F | Resp 16 | Ht 68.0 in | Wt 157.2 lb

## 2021-04-03 DIAGNOSIS — H6123 Impacted cerumen, bilateral: Secondary | ICD-10-CM | POA: Diagnosis not present

## 2021-04-03 DIAGNOSIS — H9192 Unspecified hearing loss, left ear: Secondary | ICD-10-CM | POA: Diagnosis not present

## 2021-04-03 MED ORDER — MELOXICAM 7.5 MG PO TABS: 7.5000 mg | ORAL_TABLET | Freq: Every day | ORAL | 0 refills | Status: DC

## 2021-04-03 NOTE — Progress Notes (Signed)
Subjective:    Patient ID: Erin Fox, female    DOB: Sep 15, 1960, 61 y.o.   MRN: [id number]  DOS:  04/03/2021 Type of visit - description: Acute  Patient works in a [place], + noisy environment.  Currently using earplugs. They do hearing tests regularly and she has been told her hearing is decreasing in the last few years.  The patient herself noticed no issues, no problems hearing the TV or talking with people. Denies any ear pain, no dizziness, no tinnitus.  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   Wears glasses     Past Surgical History:  Procedure Laterality Date   5th toes resected bilateral  1990's   bone's removed   ABDOMINAL HYSTERECTOMY  1984   W/ UNILATERAL SALPINGOOPHORECTOMY   ABDOMINOPLASTY  1990   BREAST BIOPSY Right 01/02/2004   CESAREAN SECTION     COLONOSCOPY     COLONOSCOPY W/ POLYPECTOMY  last one 02-14-2014   CYSTO/  RETROGRADE PYELOGRAM/  LEFT URETEROSCOPY/  BLADDER BIOPSY  08/27/2004   CYSTOSCOPY W/ RETROGRADES Bilateral 03/17/2014   Procedure: CYSTOSCOPY WITH RETROGRADE PYELOGRAM;  Surgeon: Alexis Frock, MD;  Location: [facility];  Service: Urology;  Laterality: Bilateral;   CYSTOSCOPY WITH BIOPSY N/A 03/17/2014   Procedure: CYSTOSCOPY WITH BIOPSY AND FULGERATION;  Surgeon: Alexis Frock, MD;  Location: [facility];  Service: Urology;  Laterality: N/A;   OTHER SURGICAL HISTORY Left 2016    Cyst Removal on Left ear   POLYPECTOMY     REMOVAL BARTHOLIN CYST/ GLAND AND REVISION LEFT LABIA  06/10/2004    Current Outpatient Medications  Medication Instructions   acyclovir (ZOVIRAX) 400 mg, Oral, 2 times daily PRN   amLODipine (NORVASC) 2.5 mg, Oral, Daily   carvedilol (COREG) 12.5 mg, Oral, 2 times daily with meals   fluticasone (FLONASE) 50 MCG/ACT nasal spray 2 sprays, Each Nare, Daily   folic acid (FOLVITE) 5 mg, Oral, Daily   meloxicam (MOBIC) 7.5 mg, Oral, Daily   PREVIDENT 5000 BOOSTER PLUS 1.1 % PSTE dental   simvastatin (ZOCOR) 20 mg, Oral, Daily at bedtime   SYMBICORT 160-4.5 MCG/ACT inhaler 2 puffs, Inhalation, 2 times daily   Vitamin D 2,000 Units, Oral, Daily       Objective:   Physical Exam BP 132/86 (BP Location: Left Arm, Patient Position: Sitting, Cuff Size: Small)    Pulse 68    Temp 98.5 F (36.9 C) (Oral)    Resp 16    Ht 5\' 8"  (1.727 m)    Wt 157 lb 4 oz (71.3 kg)    SpO2 97%    BMI 23.91 kg/m  General:   Well developed, NAD, BMI noted. HEENT:  Normocephalic . Face symmetric, atraumatic Ears: + Cerumen impaction bilaterally. Neurologic:  alert & oriented X3.  Speech normal, gait appropriate  for age and unassisted Psych--  Cognition and judgment appear intact.  Cooperative with normal attention span and concentration.  Behavior appropriate. No anxious or depressed appearing.      Assessment    Assessment HTN: Onset 07-2016 Anxiety, insomnia Hyperlipidemia Reacive airway DZ dx 2019 HSV L leg edema,Venous insufficiency, since the 80s after she had her child. Over active bladder, s/p cysto   >1, 2011, 2016, etc, see urology notes  Non-obstructive nephrolithiasis Hemorrhoids- banding x 3 2020 IBS? Sees dermatology regularly Mild folic acid deficiency (labs 01/2020).  PLAN Decreased hearing: Patient is screened regularly, latest test showed that her hearing is decreasing, no symptoms.  Recommend to upgrade  earplugs to earmuffs at work,  she works in a noisy environment.  Referred to an audiologist for formal evaluation Cerumen impaction: With the patient verbal concerned I remove mechanically with a spoon abundant wax from both ears, I remove approximately 80% of the wax, TMs and canals normal. Recommend to use peroxide or Debrox OTC to prevent further problems    This visit occurred during the SARS-CoV-2 public health emergency.  Safety protocols were in place, including screening questions prior to the visit, additional usage of staff PPE, and extensive cleaning of exam room while observing appropriate contact time as indicated for disinfecting solutions.

## 2021-04-03 NOTE — Patient Instructions (Signed)
We are referring you to the audiologist for a formal ear testing  Be sure you use your earmuffs consistently at work

## 2021-04-04 NOTE — Assessment & Plan Note (Signed)
Decreased hearing: Patient is screened regularly, latest test showed that her hearing is decreasing, no symptoms.  Recommend to upgrade  earplugs to earmuffs at work, she works in a noisy environment.  Referred to an audiologist for formal evaluation Cerumen impaction: With the patient verbal concerned I remove mechanically with a spoon abundant wax from both ears, I remove approximately 80% of the wax, TMs and canals normal. Recommend to use peroxide or Debrox OTC to prevent further problems

## 2021-04-08 ENCOUNTER — Telehealth: Payer: Self-pay | Admitting: Gastroenterology

## 2021-04-08 NOTE — Telephone Encounter (Signed)
The pt was not aware that she did not have to do the prep all at once.  After I explained that the prep is a split dose she has agreed to try that prep and will call back if she has any concerns.

## 2021-04-08 NOTE — Telephone Encounter (Signed)
Inbound call from patient stated that she went to the pharmacy to pick up her prep medication and stated there was no way she could drink all the liquid that is involved. Patient stated the last time she had a colon done that she did not have to drink that much.  Seeking advice if there is anyway she can switch preps. Please advise.

## 2021-05-02 DIAGNOSIS — H90A22 Sensorineural hearing loss, unilateral, left ear, with restricted hearing on the contralateral side: Secondary | ICD-10-CM | POA: Diagnosis not present

## 2021-05-03 ENCOUNTER — Telehealth: Payer: Self-pay

## 2021-05-03 DIAGNOSIS — H6120 Impacted cerumen, unspecified ear: Secondary | ICD-10-CM

## 2021-05-03 NOTE — Telephone Encounter (Signed)
Pt called office stating she needs a referral to ENT because she went to an ear audiologist yesterday and they werent able to remove her built up wax in her L ear ?

## 2021-05-03 NOTE — Telephone Encounter (Signed)
ENT referral placed.

## 2021-05-10 ENCOUNTER — Telehealth: Payer: Self-pay

## 2021-05-10 NOTE — Telephone Encounter (Signed)
PA initiated via Covermymeds; KEY: [place]. PA approved.  ? ?This request has been approved using information available on the patient's profile. [id number];PSUGAY:[id number];Review Type:Prior Auth;Coverage Start Date:04/10/2021;Coverage End Date:05/10/2022; ?

## 2021-05-15 ENCOUNTER — Encounter (HOSPITAL_COMMUNITY): Payer: Self-pay | Admitting: Gastroenterology

## 2021-05-15 NOTE — Progress Notes (Signed)
Attempted to obtain medical history via telephone, unable to reach at this time. I left a voicemail to return pre surgical testing department's phone call.  

## 2021-05-22 ENCOUNTER — Telehealth: Payer: Self-pay | Admitting: Internal Medicine

## 2021-05-22 DIAGNOSIS — H6123 Impacted cerumen, bilateral: Secondary | ICD-10-CM | POA: Diagnosis not present

## 2021-05-22 MED ORDER — ACYCLOVIR 400 MG PO TABS: 400.0000 mg | ORAL_TABLET | Freq: Two times a day (BID) | ORAL | 3 refills | Status: AC | PRN

## 2021-05-22 MED ORDER — SIMVASTATIN 20 MG PO TABS: 20.0000 mg | ORAL_TABLET | Freq: Every day | ORAL | 1 refills | Status: DC

## 2021-05-22 MED ORDER — FLUTICASONE PROPIONATE 50 MCG/ACT NA SUSP: 2.0000 | Freq: Every day | NASAL | 3 refills | Status: DC

## 2021-05-22 MED ORDER — AMLODIPINE BESYLATE 2.5 MG PO TABS: 2.5000 mg | ORAL_TABLET | Freq: Every day | ORAL | 1 refills | Status: DC

## 2021-05-22 MED ORDER — SYMBICORT 160-4.5 MCG/ACT IN AERO: 2.0000 | INHALATION_SPRAY | Freq: Two times a day (BID) | RESPIRATORY_TRACT | 1 refills | Status: DC

## 2021-05-22 MED ORDER — CARVEDILOL 12.5 MG PO TABS: 12.5000 mg | ORAL_TABLET | Freq: Two times a day (BID) | ORAL | 1 refills | Status: DC

## 2021-05-22 MED ORDER — FOLIC ACID 1 MG PO TABS: 5.0000 mg | ORAL_TABLET | Freq: Every day | ORAL | 3 refills | Status: DC

## 2021-05-22 NOTE — Telephone Encounter (Signed)
Rxs sent

## 2021-05-22 NOTE — Telephone Encounter (Signed)
Pt has requested refill on 7 medications.  ? ?Medication:  ?folic acid (FOLVITE) 1 MG tablet ? ?carvedilol (COREG) 12.5 MG tablet ? ?SYMBICORT 160-4.5 MCG/ACT inhaler  ? ?simvastatin (ZOCOR) 20 MG tablet ? ?acyclovir (ZOVIRAX) 400 MG tablet ? ?amLODipine (NORVASC) 2.5 MG tablet ? ?fluticasone (FLONASE) 50 MCG/ACT nasal spray  ? ?Has the patient contacted their pharmacy? No. ? ? ?[facility]erred [facility]  ? ?

## 2021-05-23 ENCOUNTER — Other Ambulatory Visit: Payer: Self-pay

## 2021-05-23 ENCOUNTER — Ambulatory Visit (HOSPITAL_COMMUNITY)
Admission: RE | Admit: 2021-05-23 | Discharge: 2021-05-23 | Disposition: A | Discharge status: Home / Self Care | Payer: BC Managed Care – PPO | Source: Ambulatory Visit | Attending: Gastroenterology | Admitting: Gastroenterology

## 2021-05-23 ENCOUNTER — Encounter (HOSPITAL_COMMUNITY)
Admission: RE | Disposition: A | Discharge status: Home / Self Care | Payer: Self-pay | Source: Ambulatory Visit | Attending: Gastroenterology

## 2021-05-23 ENCOUNTER — Ambulatory Visit (HOSPITAL_COMMUNITY): Payer: BC Managed Care – PPO | Admitting: Certified Registered Nurse Anesthetist

## 2021-05-23 ENCOUNTER — Encounter (HOSPITAL_COMMUNITY): Payer: Self-pay | Admitting: Gastroenterology

## 2021-05-23 DIAGNOSIS — K644 Residual hemorrhoidal skin tags: Secondary | ICD-10-CM | POA: Insufficient documentation

## 2021-05-23 DIAGNOSIS — D123 Benign neoplasm of transverse colon: Secondary | ICD-10-CM | POA: Diagnosis not present

## 2021-05-23 DIAGNOSIS — K5730 Diverticulosis of large intestine without perforation or abscess without bleeding: Secondary | ICD-10-CM | POA: Diagnosis not present

## 2021-05-23 DIAGNOSIS — K621 Rectal polyp: Secondary | ICD-10-CM | POA: Diagnosis not present

## 2021-05-23 DIAGNOSIS — K642 Third degree hemorrhoids: Secondary | ICD-10-CM | POA: Diagnosis not present

## 2021-05-23 DIAGNOSIS — K641 Second degree hemorrhoids: Secondary | ICD-10-CM | POA: Insufficient documentation

## 2021-05-23 DIAGNOSIS — K635 Polyp of colon: Secondary | ICD-10-CM | POA: Diagnosis not present

## 2021-05-23 DIAGNOSIS — K5790 Diverticulosis of intestine, part unspecified, without perforation or abscess without bleeding: Secondary | ICD-10-CM | POA: Diagnosis not present

## 2021-05-23 DIAGNOSIS — K649 Unspecified hemorrhoids: Secondary | ICD-10-CM | POA: Diagnosis not present

## 2021-05-23 HISTORY — PX: ENDOSCOPIC MUCOSAL RESECTION: SHX6839

## 2021-05-23 HISTORY — PX: POLYPECTOMY: SHX5525

## 2021-05-23 HISTORY — PX: COLONOSCOPY WITH PROPOFOL: SHX5780

## 2021-05-23 HISTORY — PX: SUBMUCOSAL LIFTING INJECTION: SHX6855

## 2021-05-23 HISTORY — PX: HEMOSTASIS CLIP PLACEMENT: SHX6857

## 2021-05-23 SURGERY — COLONOSCOPY WITH PROPOFOL
Anesthesia: Monitor Anesthesia Care

## 2021-05-23 MED ORDER — LACTATED RINGERS IV SOLN
INTRAVENOUS | Status: AC | PRN
  Administered 2021-05-23: 1000 mL via INTRAVENOUS

## 2021-05-23 MED ORDER — PROPOFOL 500 MG/50ML IV EMUL
INTRAVENOUS | Status: DC | PRN
  Administered 2021-05-23: 125 ug/kg/min via INTRAVENOUS

## 2021-05-23 MED ORDER — PROPOFOL 10 MG/ML IV BOLUS
INTRAVENOUS | Status: DC | PRN
  Administered 2021-05-23: 10 mg via INTRAVENOUS
  Administered 2021-05-23 (×2): 15 mg via INTRAVENOUS

## 2021-05-23 SURGICAL SUPPLY — 22 items

## 2021-05-23 NOTE — Anesthesia Postprocedure Evaluation (Signed)
Anesthesia Post Note ? ?Patient: Erin Fox ? ?Procedure(s) Performed: COLONOSCOPY WITH PROPOFOL ?ENDOSCOPIC MUCOSAL RESECTION ?POLYPECTOMY ?HEMOSTASIS CLIP PLACEMENT ? ?  ? ?Patient location during evaluation: PACU ?Anesthesia Type: MAC ?Level of consciousness: awake and alert ?Pain management: pain level controlled ?Vital Signs Assessment: post-procedure vital signs reviewed and stable ?Respiratory status: spontaneous breathing, nonlabored ventilation and respiratory function stable ?Cardiovascular status: blood pressure returned to baseline and stable ?Postop Assessment: no apparent nausea or vomiting ?Anesthetic complications: no ? ? ?No notable events documented. ? ?Last Vitals:  ?Vitals:  ? 05/23/21 1015 05/23/21 1030  ?BP: 126/81 (!) 144/87  ?Pulse: (!) 55 (!) 53  ?Resp: 20 20  ?Temp:  36.7 ?C  ?SpO2: 95% 96%  ?  ?Last Pain:  ?Vitals:  ? 05/23/21 1030  ?TempSrc:   ?PainSc: 0-No pain  ? ? ?  ?  ?  ?  ?  ?  ? ?Jarome Matin Lucky Alverson ? ? ? ? ?

## 2021-05-23 NOTE — Transfer of Care (Addendum)
Immediate Anesthesia Transfer of Care Note ? ?Patient: Erin Fox ? ?Procedure(s) Performed: COLONOSCOPY WITH PROPOFOL ?ENDOSCOPIC MUCOSAL RESECTION ?POLYPECTOMY ?HEMOSTASIS CLIP PLACEMENT ? ?Patient Location: PACU ? ?Anesthesia Type:MAC ? ?Level of Consciousness: drowsy and patient cooperative ? ?Airway & Oxygen Therapy: Patient Spontanous Breathing and Patient connected to face mask oxygen ? ?Post-op Assessment: Report given to RN and Post -op Vital signs reviewed and stable ? ?Post vital signs: Reviewed and stable ? ?Last Vitals:  ?Vitals Value Taken Time  ?BP 144/87 05/23/21 1030  ?Temp 36.7 ?C 05/23/21 1030  ?Pulse 53 05/23/21 1030  ?Resp 20 05/23/21 1030  ?SpO2 96 % 05/23/21 1030  ? ? ?Last Pain:  ?Vitals:  ? 05/23/21 1030  ?TempSrc:   ?PainSc: 0-No pain  ?   ? ?  ? ?Complications: No notable events documented. ?

## 2021-05-23 NOTE — Anesthesia Preprocedure Evaluation (Signed)
Anesthesia Evaluation  ?Patient identified by MRN, date of birth, ID band ?Patient awake ? ? ? ?Reviewed: ?Allergy & Precautions, NPO status , Patient's Chart, lab work & pertinent test results ? ?Airway ?Mallampati: II ? ?TM Distance: >3 FB ?Neck ROM: Full ? ? ? Dental ? ?(+) Teeth Intact, Dental Advisory Given ?  ?Pulmonary ?Current Smoker and Patient abstained from smoking.,  ?symbicort used today ?1/2ppd, rarely uses rescue inhaler ?  ?Pulmonary exam normal ?breath sounds clear to auscultation ? ? ? ? ? ? Cardiovascular ?hypertension, Pt. on medications and Pt. on home beta blockers ?Normal cardiovascular exam ?Rhythm:Regular Rate:Normal ? ? ?  ?Neuro/Psych ?PSYCHIATRIC DISORDERS Anxiety negative neurological ROS ?   ? GI/Hepatic ?negative GI ROS, Neg liver ROS,   ?Endo/Other  ?negative endocrine ROS ? Renal/GU ?negative Renal ROS  ?negative genitourinary ?  ?Musculoskeletal ?negative musculoskeletal ROS ?(+)  ? Abdominal ?  ?Peds ?negative pediatric ROS ?(+)  Hematology ?negative hematology ROS ?(+)   ?Anesthesia Other Findings ? ? Reproductive/Obstetrics ?negative OB ROS ? ?  ? ? ? ? ? ? ? ? ? ? ? ? ? ?  ?  ? ? ? ? ? ? ? ? ?Anesthesia Physical ?Anesthesia Plan ? ?ASA: 2 ? ?Anesthesia Plan: MAC  ? ?Post-op Pain Management:   ? ?Induction: Intravenous ? ?PONV Risk Score and Plan: 1 and Propofol infusion, TIVA and Treatment may vary due to age or medical condition ? ?Airway Management Planned: Natural Airway and Simple Face Mask ? ?Additional Equipment: None ? ?Intra-op Plan:  ? ?Post-operative Plan:  ? ?Informed Consent: I have reviewed the patients History and Physical, chart, labs and discussed the procedure including the risks, benefits and alternatives for the proposed anesthesia with the patient or authorized representative who has indicated his/her understanding and acceptance.  ? ? ? ?Dental advisory given ? ?Plan Discussed with: CRNA ? ?Anesthesia Plan Comments:    ? ? ? ? ? ? ?Anesthesia Quick Evaluation ? ?

## 2021-05-23 NOTE — Op Note (Signed)
[facility] ?Patient Name: Erin Fox ?Procedure Date : 05/23/2021 ?MRN: [id number] ?Attending MD: Justice Britain , MD ?Date of Birth: 1961-01-12 ?CSN: [id number] ?Age: 61 ?Admit Type: Outpatient ?Procedure:                Colonoscopy ?Indications:              Excision of colonic polyp ?Providers:                Justice Britain, MD, Doristine Johns, RN, [facility]  ?                          Jerline Pain, Technician ?Referring MD:             Estill Cotta. Loletha Carrow, MD, Kathlene November, MD ?Medicines:                Monitored Anesthesia Care ?Complications:            No immediate complications. ?Estimated Blood Loss:     Estimated blood loss was minimal. ?Procedure:                Pre-Anesthesia Assessment: ?                          - Prior to the procedure, a History and Physical  ?                          was performed, and patient medications and  ?                          allergies were reviewed. The patient's tolerance of  ?                          previous anesthesia was also reviewed. The risks  ?                          and benefits of the procedure and the sedation  ?                          options and risks were discussed with the patient.  ?                          All questions were answered, and informed consent  ?                          was obtained. Prior Anticoagulants: The patient has  ?                          taken no previous anticoagulant or antiplatelet  ?                          agents except for NSAID medication. ASA Grade  ?                          Assessment: II - A patient with mild systemic  ?  disease. After reviewing the risks and benefits,  ?                          the patient was deemed in satisfactory condition to  ?                          undergo the procedure. ?                          After obtaining informed consent, the colonoscope  ?                          was passed under direct vision. Throughout the  ?                           procedure, the patient's blood pressure, pulse, and  ?                          oxygen saturations were monitored continuously. The  ?                          CF-HQ190L ([id number]) Olympus colonoscope was  ?                          introduced through the anus and advanced to the 5  ?                          cm into the ileum. The patient tolerated the  ?                          procedure. The quality of the bowel preparation was  ?                          adequate. The terminal ileum, ileocecal valve,  ?                          appendiceal orifice, and rectum were photographed.  ?                          The colonoscopy was somewhat difficult due to a  ?                          tortuous colon. Successful completion of the  ?                          procedure was aided by changing the patient's  ?                          position, using manual pressure, straightening and  ?                          shortening the scope to obtain bowel loop reduction  ?  and using scope torsion as well as use of abdominal  ?                          binder. ?Scope In: 9:02:55 AM ?Scope Out: 9:47:36 AM ?Total Procedure Duration: 0 hours 44 minutes 41 seconds  ?Findings: ?     Skin tags were found on perianal exam. ?     The digital rectal exam findings include hemorrhoids. Pertinent  ?     negatives include no palpable rectal lesions. ?     The terminal ileum and ileocecal valve appeared normal. ?     A medium post polypectomy scar was found in the ascending colon. There  ?     was what appeared to be some possible residual polypoid tissue. The  ?     tissue was removed with a cold snare. Resection and retrieval were  ?     complete. ?     A 35 mm polyp was found in the proximal transverse colon. The polyp was  ?     sessile. Preparations were made for mucosal resection. NBI imaging and  ?     White-light endoscopy was done to demarcate the borders of the lesion.  ?     Everlift was injected to raise the  lesion. Piecemeal mucosal resection  ?     using a cold snare was performed. Resection and retrieval were complete.  ?     To prevent bleeding after mucosal resection, four hemostatic clips were  ?     successfully placed (MR conditional). There was no bleeding at the end  ?     of the procedure. ?     Five sessile polyps were found in the rectum (3), recto-sigmoid colon  ?     (1) and descending colon (1). The polyps were 2 to 6 mm in size. These  ?     polyps were removed with a cold snare. Resection and retrieval were  ?     complete. ?     Multiple small-mouthed diverticula were found in the left colon and  ?     right colon. ?     Normal mucosa was found in the entire colon otherwise. ?     Non-bleeding non-thrombosed external and internal hemorrhoids were found  ?     during retroflexion, during perianal exam and during digital exam. The  ?     hemorrhoids were Grade II (internal hemorrhoids that prolapse but reduce  ?     spontaneously). ?Impression:               - Perianal skin tags found on perianal exam.  ?                          Hemorrhoids found on digital rectal exam. ?                          - The examined portion of the ileum was normal. ?                          - Post-polypectomy scar in the ascending colon with  ?                          polypoid tissue present - removed  tissue for  ?                          sampling purposes. ?                          - One 35 mm polyp in the proximal transverse colon,  ?                          removed with piecemeal cold mucosal resection.  ?                          Resected and retrieved. Clips (MR conditional) were  ?                          placed. ?                          - Five 2 to 6 mm polyps in the rectum, at the  ?                          recto-sigmoid colon and in the descending colon,  ?                          removed with a cold snare. Resected and retrieved. ?                          - Diverticulosis in the left colon and in the  right  ?                          colon. ?                          - Normal mucosa in the entire examined colon  ?                          otherwise. ?                          - Non-bleeding non-thrombosed external and internal  ?                          hemorrhoids. ?Recommendation:           - The patient will be observed post-procedure,  ?                          until all discharge criteria are met. ?                          - Discharge patient to home. ?                          - Patient has a contact number available for  ?                          emergencies.  The signs and symptoms of potential  ?                          delayed complications were discussed with the  ?                          patient. Return to normal activities tomorrow.  ?                          Written discharge instructions were provided to the  ?                          patient. ?                          - High fiber diet. ?                          - Use FiberCon 1-2 tablets PO daily. ?                          - No NSAIDs for 2-weeks to decrease  ?                          post-interventional bleeding risk. ?                          - Continue present medications. ?                          - Await pathology results. ?                          - Repeat colonoscopy in 9-12 months for  ?                          surveillance if adenomatous. ?                          - The findings and recommendations were discussed  ?                          with the patient. ?                          - The findings and recommendations were discussed  ?                          with the patient's family. ?Procedure Code(s):        --- Professional --- ?                          [phone], Colonoscopy, flexible; with endoscopic  ?                          mucosal resection ?  75423, 61, Colonoscopy, flexible; with removal of  ?                          tumor(s), polyp(s), or other lesion(s) by snare  ?                           technique ?Diagnosis Code(s):        --- Professional --- ?                          K64.1, Second degree hemorrhoids ?                          Z98.890, Other specified postprocedural states ?

## 2021-05-23 NOTE — Anesthesia Procedure Notes (Signed)
Procedure Name: [place] ?Date/Time: 05/23/2021 8:54 AM ?Performed by: Janene Harvey, CRNA ?Pre-anesthesia Checklist: Patient identified, Emergency Drugs available, Suction available and Patient being monitored ?Patient Re-evaluated:Patient Re-evaluated prior to induction ?Oxygen Delivery Method: Simple face mask ?Induction Type: IV induction ?Placement Confirmation: positive ETCO2 ?Dental Injury: Teeth and Oropharynx as per pre-operative assessment  ? ? ? ? ?

## 2021-05-23 NOTE — H&P (Signed)
? ?GASTROENTEROLOGY PROCEDURE H&P NOTE  ? ?Primary Care Physician: ?Colon Branch, MD ? ?HPI: ?Erin Fox is a 61 y.o. female who presents for colonoscopy for history of colon polyps, large transverse colon polyp with attempt at resection and also follow-up of prior cold piecemeal resections. ? ?Past Medical History:  ?Diagnosis Date  ? Anxiety   ? Blood transfusion without reported diagnosis   ? after pregnancy 5-6 units  ? Cyst on ear   ? Left ear  ? Diverticulosis of colon   ? Erythematous bladder mucosa   ? Hematuria   ? w/u neg ~ 01-2014, 03-2014  ? Hernia, umbilical   ? History of colon polyps   ? HSV (herpes simplex virus) infection   ? Hyperlipidemia   ? Hypertension   ? Insomnia   ? Nephrolithiasis   ? NON-OBSTRUCTIVE- pt denies this  ? OAB (overactive bladder)   ? 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 Wears glasses   ? ?Past Surgical History:  ?Procedure Laterality Date  ? 5th toes resected bilateral  1990's  ? bone's removed  ? ABDOMINAL HYSTERECTOMY  1984  ? W/ UNILATERAL SALPINGOOPHORECTOMY  ? ABDOMINOPLASTY  1990  ? BREAST BIOPSY Right 01/02/2004  ? CESAREAN SECTION    ? COLONOSCOPY    ? COLONOSCOPY W/ POLYPECTOMY  last one 02-14-2014  ? CYSTO/  RETROGRADE PYELOGRAM/  LEFT URETEROSCOPY/  BLADDER BIOPSY  08/27/2004  ? CYSTOSCOPY W/ RETROGRADES Bilateral 03/17/2014  ? Procedure: CYSTOSCOPY WITH RETROGRADE PYELOGRAM;  Surgeon: Alexis Frock, MD;  Location: [facility];  Service: Urology;  Laterality: Bilateral;  ? CYSTOSCOPY WITH BIOPSY N/A 03/17/2014  ? Procedure: CYSTOSCOPY WITH BIOPSY AND FULGERATION;  Surgeon: Alexis Frock, MD;  Location: [facility];  Service: Urology;  Laterality: N/A;  ? OTHER SURGICAL HISTORY Left 2016  ? Cyst Removal on Left ear  ? POLYPECTOMY    ? REMOVAL BARTHOLIN CYST/ GLAND AND REVISION LEFT LABIA  06/10/2004  ? ?No current facility-administered medications for this encounter.  ? ?No current facility-administered  medications for this encounter. ?No Known Allergies ?Family History  ?Problem Relation Age of Onset  ? Colon polyps Mother   ? Hyperlipidemia Mother   ? Hypertension Mother   ?     M and sister   ? Diabetes Father   ? Colon polyps Sister 65  ?     8 polyps removed  ? High blood pressure Sister   ? Cancer Paternal Aunt   ?     lung  ? Colon cancer Paternal Uncle   ? Colon cancer Other   ?     uncle , dx at age 56s  ? Breast cancer Neg Hx   ? Esophageal cancer Neg Hx   ? Rectal cancer Neg Hx   ? Stomach cancer Neg Hx   ? ?Social History  ? ?Socioeconomic History  ? Marital status: Divorced  ?  Spouse name: Not on file  ? Number of children: 1  ? Years of education: Not on file  ? Highest education level: Not on file  ?Occupational History  ? Occupation: Radiation protection practitioner  ?  Employer: [employer]  ?Tobacco Use  ? Smoking status: Every Day  ?  Packs/day: 0.50  ?  Years: 30.00  ?  Pack years: 15.00  ?  Types: Cigarettes  ? Smokeless tobacco: Never  ? Tobacco comments:  ?  3/4  ppd  ?Substance and Sexual Activity  ? Alcohol use: Yes  ?  Alcohol/week: 0.0 standard drinks  ?  Comment: OCCASIONAL  ? Drug use: Yes  ?  Types: Marijuana  ?  Comment: at night  Last smoked marajuana 02/21/21  ? Sexual activity: Not on file  ?Other Topics Concern  ? Not on file  ?Social History Narrative  ? Her mother lives w/ her, she is elderly, has memory issues, + stress for Gracelee  ?    ? ?Social Determinants of Health  ? ?Financial Resource Strain: Not on file  ?Food Insecurity: Not on file  ?Transportation Needs: Not on file  ?Physical Activity: Not on file  ?Stress: Not on file  ?Social Connections: Not on file  ?Intimate Partner Violence: Not on file  ? ? ?Physical Exam: ?There were no vitals filed for this visit. ?There is no height or weight on file to calculate BMI. ?GEN: NAD ?EYE: Sclerae anicteric ?ENT: MMM ?CV: Non-tachycardic ?GI: Soft, NT/ND ?NEURO:  Alert & Oriented x 3 ? ?Lab Results: ?No results for input(s): WBC, HGB,  HCT, PLT in the last 72 hours. ?BMET ?No results for input(s): NA, K, CL, CO2, GLUCOSE, BUN, CREATININE, CALCIUM in the last 72 hours. ?LFT ?No results for input(s): PROT, ALBUMIN, AST, ALT, ALKPHOS, BILITOT, BILIDIR, IBILI in the last 72 hours. ?PT/INR ?No results for input(s): LABPROT, INR in the last 72 hours. ? ? ?Impression / Plan: ?This is a 61 y.o.female who presents for colonoscopy for history of colon polyps, large transverse colon polyp with attempt at resection and also follow-up of prior cold piecemeal resections. ? ?Based upon the description and endoscopic pictures I do feel that it is reasonable to pursue an Advanced Polypectomy attempt of the polyp/lesion.  We discussed some of the techniques of advanced polypectomy which include Endoscopic Mucosal Resection, OVESCO Full-Thickness Resection, Endorotor Morcellation, and Tissue Ablation via Fulguration.  We also reviewed images of typical techniques as noted above.  The risks and benefits of endoscopic evaluation were discussed with the patient; these include but are not limited to the risk of perforation, infection, bleeding, missed lesions, lack of diagnosis, severe illness requiring hospitalization, as well as anesthesia and sedation related illnesses.  During attempts at advanced resection, the risks of bleeding and perforation/leak are increased as opposed to diagnostic and screening procedures, and that was discussed with the patient as well.   In addition, I explained that with the possible need for piecemeal resection, subsequent short-interval endoscopic evaluation for follow up and potential retreatment of the lesion/area may be necessary. If, after attempt at removal of the polyp/lesion, it is found that the patient has a complication or that an invasive lesion or malignant lesion is found, or that the polyp/lesion continues to recur, the patient is aware and understands that surgery may still be indicated/required.  All patient questions  were answered, to the best of my ability, and the patient agrees to the aforementioned plan of action with follow-up as indicated. ? ? ?The risks and benefits of endoscopic evaluation/treatment were discussed with the patient and/or family; these include but are not limited to the risk of perforation, infection, bleeding, missed lesions, lack of diagnosis, severe illness requiring hospitalization, as well as anesthesia and sedation related illnesses.  The patient's history has been reviewed, patient examined, no change in status, and deemed stable for procedure.  The patient and/or family is agreeable to proceed.  ? ? ?Justice Britain, MD ?[facility] ?Advanced Endoscopy ?Office # [phone] ? ?

## 2021-05-24 ENCOUNTER — Encounter (HOSPITAL_COMMUNITY): Payer: Self-pay | Admitting: Gastroenterology

## 2021-05-24 LAB — SURGICAL PATHOLOGY

## 2021-05-25 ENCOUNTER — Other Ambulatory Visit: Payer: Self-pay | Admitting: Internal Medicine

## 2021-05-26 ENCOUNTER — Encounter: Payer: Self-pay | Admitting: Gastroenterology

## 2021-05-28 ENCOUNTER — Ambulatory Visit: Payer: BC Managed Care – PPO

## 2021-05-28 ENCOUNTER — Ambulatory Visit (INDEPENDENT_AMBULATORY_CARE_PROVIDER_SITE_OTHER): Payer: BC Managed Care – PPO

## 2021-05-28 DIAGNOSIS — Z23 Encounter for immunization: Secondary | ICD-10-CM | POA: Diagnosis not present

## 2021-05-28 NOTE — Progress Notes (Signed)
Erin Fox is a 61 y.o. female presents to the office today for shingles injections, per physician's orders. ?(route) was administered Left deltoid  (location) today. Patient tolerated injection. ? ? ? ?Drayson Dorko M Ingeborg Fite ? ?

## 2021-06-14 ENCOUNTER — Other Ambulatory Visit: Payer: Self-pay | Admitting: Internal Medicine

## 2021-06-24 ENCOUNTER — Ambulatory Visit (INDEPENDENT_AMBULATORY_CARE_PROVIDER_SITE_OTHER): Payer: BC Managed Care – PPO | Admitting: Dermatology

## 2021-06-24 ENCOUNTER — Encounter: Payer: Self-pay | Admitting: Dermatology

## 2021-06-24 DIAGNOSIS — L821 Other seborrheic keratosis: Secondary | ICD-10-CM

## 2021-06-24 DIAGNOSIS — Z86018 Personal history of other benign neoplasm: Secondary | ICD-10-CM

## 2021-06-24 DIAGNOSIS — Z1283 Encounter for screening for malignant neoplasm of skin: Secondary | ICD-10-CM | POA: Diagnosis not present

## 2021-06-24 DIAGNOSIS — L820 Inflamed seborrheic keratosis: Secondary | ICD-10-CM | POA: Diagnosis not present

## 2021-06-24 DIAGNOSIS — D485 Neoplasm of uncertain behavior of skin: Secondary | ICD-10-CM

## 2021-06-24 NOTE — Patient Instructions (Signed)

## 2021-07-09 DIAGNOSIS — Z800 Family history of malignant neoplasm of digestive organs: Secondary | ICD-10-CM | POA: Diagnosis not present

## 2021-07-09 DIAGNOSIS — Z8371 Family history of colonic polyps: Secondary | ICD-10-CM | POA: Diagnosis not present

## 2021-07-09 DIAGNOSIS — Z86010 Personal history of colonic polyps: Secondary | ICD-10-CM | POA: Diagnosis not present

## 2021-07-09 DIAGNOSIS — Z8042 Family history of malignant neoplasm of prostate: Secondary | ICD-10-CM | POA: Diagnosis not present

## 2021-07-09 DIAGNOSIS — Z801 Family history of malignant neoplasm of trachea, bronchus and lung: Secondary | ICD-10-CM | POA: Diagnosis not present

## 2021-07-09 DIAGNOSIS — Z01419 Encounter for gynecological examination (general) (routine) without abnormal findings: Secondary | ICD-10-CM | POA: Diagnosis not present

## 2021-07-10 ENCOUNTER — Encounter: Payer: Self-pay | Admitting: Dermatology

## 2021-07-10 NOTE — Progress Notes (Signed)
   Follow-Up Visit   Subjective  Erin Fox is a 61 y.o. female who presents for the following: Skin Problem (Mole on the chest x 1 year, patient has history of moderate atypia on the abdomen from 2020).  Change in spot on chest, check other areas Location:  Duration:  Quality:  Associated Signs/Symptoms: Modifying Factors:  Severity:  Timing: Context:   Objective  Well appearing patient in no apparent distress; mood and affect are within normal limits. No atypical nevi/pigmented lesions (all checked with dermoscopy) noted at the time of the visit. Waist up exam today chest neck and facial keratosis.  1 possible new nonmelanoma skin cancer chest will be biopsied  Chest - Medial (Center) Bi-lobed 8 mm brown papule, the upper two thirds is a clinically typical seborrheic keratosis but there is a pearly 3 mm extension inferiorly and that should be biopsied.     Neck Multiple clinically typical noninflamed 2 to 5 mm dark brown textured papules, typical dermoscopy    All skin waist up examined.  Areas beneath undergarments not fully examined.   Assessment & Plan    Screening exam for skin cancer  Annual skin examination  Neoplasm of uncertain behavior of skin Chest - Medial (Center)  Skin / nail biopsy Type of biopsy: tangential   Informed consent: discussed and consent obtained   Timeout: patient name, date of birth, surgical site, and procedure verified   Anesthesia: the lesion was anesthetized in a standard fashion   Anesthetic:  1% lidocaine w/ epinephrine 1-100,000 local infiltration Instrument used: flexible razor blade   Hemostasis achieved with: aluminum chloride and electrodesiccation   Outcome: patient tolerated procedure well   Post-procedure details: wound care instructions given    Specimen 1 - Surgical pathology Differential Diagnosis: seb k   Check Margins: No  Seborrheic keratosis Neck  No intervention indicated      I, Lavonna Monarch, MD, have reviewed all documentation for this visit.  The documentation on 07/10/21 for the exam, diagnosis, procedures, and orders are all accurate and complete.

## 2021-08-12 ENCOUNTER — Ambulatory Visit (INDEPENDENT_AMBULATORY_CARE_PROVIDER_SITE_OTHER): Payer: BC Managed Care – PPO | Admitting: Internal Medicine

## 2021-08-12 ENCOUNTER — Ambulatory Visit: Payer: BC Managed Care – PPO | Admitting: Internal Medicine

## 2021-08-12 ENCOUNTER — Encounter: Payer: Self-pay | Admitting: Internal Medicine

## 2021-08-12 VITALS — BP 122/66 | HR 60 | Temp 98.4°F | Resp 16 | Ht 68.0 in | Wt 157.2 lb

## 2021-08-12 DIAGNOSIS — M7989 Other specified soft tissue disorders: Secondary | ICD-10-CM

## 2021-08-12 DIAGNOSIS — I10 Essential (primary) hypertension: Secondary | ICD-10-CM | POA: Diagnosis not present

## 2021-08-12 MED ORDER — LOSARTAN POTASSIUM 50 MG PO TABS: 50.0000 mg | ORAL_TABLET | Freq: Every day | ORAL | 0 refills | Status: DC

## 2021-08-12 NOTE — Progress Notes (Signed)
Subjective:    Patient ID: Erin Fox, female    DOB: 03/24/1960, 61 y.o.   MRN: [id number]  DOS:  08/12/2021 Type of visit - description: Hypertension management  Has  chronic swelling left leg. Currently working with a vein specialist. Was recommended to switch amlodipine to some other BP medication. Ambulatory BPs: Range from normal to occasional high, usually in the high side depending on diet.  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   Wears glasses     Past Surgical History:  Procedure Laterality Date   5th toes resected bilateral  1990's   bone's removed   ABDOMINAL HYSTERECTOMY  1984   W/ UNILATERAL SALPINGOOPHORECTOMY   ABDOMINOPLASTY  1990   BREAST BIOPSY Right 01/02/2004   CESAREAN SECTION     COLONOSCOPY     COLONOSCOPY W/ POLYPECTOMY  last one 02-14-2014   COLONOSCOPY WITH PROPOFOL N/A 05/23/2021   Procedure: COLONOSCOPY WITH PROPOFOL;  Surgeon: Irving Copas., MD;  Location: [place];  Service: Gastroenterology;  Laterality: N/A;   CYSTO/  RETROGRADE PYELOGRAM/  LEFT URETEROSCOPY/  BLADDER BIOPSY  08/27/2004   CYSTOSCOPY W/ RETROGRADES Bilateral 03/17/2014   Procedure: CYSTOSCOPY WITH RETROGRADE PYELOGRAM;  Surgeon: Alexis Frock, MD;  Location: [facility];  Service: Urology;  Laterality: Bilateral;   CYSTOSCOPY WITH BIOPSY N/A 03/17/2014   Procedure:  CYSTOSCOPY WITH BIOPSY AND FULGERATION;  Surgeon: Alexis Frock, MD;  Location: [facility];  Service: Urology;  Laterality: N/A;   ENDOSCOPIC MUCOSAL RESECTION N/A 05/23/2021   Procedure: ENDOSCOPIC MUCOSAL RESECTION;  Surgeon: Rush Landmark Telford Nab., MD;  Location: [place];  Service: Gastroenterology;  Laterality: N/A;   HEMOSTASIS CLIP PLACEMENT  05/23/2021   Procedure: HEMOSTASIS CLIP PLACEMENT;  Surgeon: Irving Copas., MD;  Location: [place];  Service: Gastroenterology;;   OTHER SURGICAL HISTORY Left 2016   Cyst Removal on Left ear   POLYPECTOMY     POLYPECTOMY  05/23/2021   Procedure: POLYPECTOMY;  Surgeon: Irving Copas., MD;  Location: [place];  Service: Gastroenterology;;   REMOVAL BARTHOLIN CYST/ GLAND AND REVISION LEFT LABIA  06/10/2004   SUBMUCOSAL LIFTING INJECTION  05/23/2021   Procedure: SUBMUCOSAL LIFTING INJECTION;  Surgeon: Irving Copas., MD;  Location: [place];  Service: Gastroenterology;;    Current Outpatient Medications  Medication Instructions   acyclovir (ZOVIRAX) 400 mg, Oral, 2 times daily PRN   carvedilol (COREG) 12.5 mg, Oral, 2 times daily with meals   fluticasone (FLONASE) 50 MCG/ACT nasal spray 2 sprays, Each Nare, Daily   folic acid (FOLVITE) 5 mg, Oral, Daily   losartan (COZAAR) 50 mg, Oral, Daily   meloxicam (MOBIC) 7.5 mg, Oral, Daily, Always take with food   PREVIDENT 5000 BOOSTER PLUS 1.1 % PSTE 1 application , dental,  Daily   simvastatin (ZOCOR) 20 mg, Oral, Daily at bedtime   SYMBICORT 160-4.5 MCG/ACT inhaler 2 puffs, Inhalation, 2 times daily   Vitamin D 2,000 Units, Oral, Daily       Objective:   Physical Exam BP 122/66   Pulse 60   Temp 98.4 F (36.9 C) (Oral)   Resp 16   Ht '5\' 8"'$  (1.727 m)   Wt 157 lb 4 oz (71.3 kg)   SpO2 96%   BMI 23.91 kg/m  General:   Well developed, NAD, BMI noted. HEENT:  Normocephalic . Face symmetric, atraumatic Lungs:  CTA B Normal  respiratory effort, no intercostal retractions, no accessory muscle use. Heart: RRR,  no murmur.  Lower extremities: L leg slightly larger compared to the right. Skin: Not pale. Not jaundice Neurologic:  alert & oriented X3.  Speech normal, gait appropriate for age and unassisted Psych--  Cognition and judgment appear intact.  Cooperative with normal attention span and concentration.  Behavior appropriate. No anxious or depressed appearing.      Assessment     Assessment HTN: Onset 07-2016 Anxiety, insomnia Hyperlipidemia Reacive airway DZ dx 2019 HSV L leg edema,Venous insufficiency, since the 80s after she had her child. Over active bladder, s/p cysto   >1, 2011, 2016, etc, see urology notes  Non-obstructive nephrolithiasis Hemorrhoids- banding x 3 2020 IBS? Sees dermatology regularly Mild folic acid deficiency (labs 01/2020).  PLAN HTN: Currently on amlodipine and carvedilol, ambulatory BPs range from normal to slightly high sometimes. Was advised by a vein specialist to stop amlodipine due to chronic left leg swelling. Plan: DC amlodipine, start losartan 50, monitor BPs, BMP in 2 to 3 weeks, low-salt diet. L leg edema, chronic issue, working with a vein specialist, they are planning some procedures.  She uses compression stockings most days with good control of symptoms. RTC labs 2 to 3 weeks.   RTC CPX 02/2022

## 2021-08-12 NOTE — Patient Instructions (Addendum)
Stop amlodipine  Start losartan 50 mg 1 tablet daily.  Were sending a prescription  Watch your salt intake  Check the  blood pressure regularly BP GOAL is between 110/65 and  135/85. If it is consistently higher or lower, let me know  [place], [place] back for blood work only in 2 to 3 weeks  Come back for a physical exam by 02-2022

## 2021-08-12 NOTE — Assessment & Plan Note (Signed)
HTN: Currently on amlodipine and carvedilol, ambulatory BPs range from normal to slightly high sometimes. Was advised by a vein specialist to stop amlodipine due to chronic left leg swelling. Plan: DC amlodipine, start losartan 50, monitor BPs, BMP in 2 to 3 weeks, low-salt diet. L leg edema, chronic issue, working with a vein specialist, they are planning some procedures.  She uses compression stockings most days with good control of symptoms. RTC labs 2 to 3 weeks.  RTC CPX 02/2022

## 2021-08-15 ENCOUNTER — Other Ambulatory Visit: Payer: Self-pay | Admitting: Internal Medicine

## 2021-08-15 DIAGNOSIS — Z1231 Encounter for screening mammogram for malignant neoplasm of breast: Secondary | ICD-10-CM

## 2021-08-20 ENCOUNTER — Telehealth: Payer: Self-pay | Admitting: Internal Medicine

## 2021-08-20 NOTE — Telephone Encounter (Signed)
FYI. Please advise.

## 2021-08-20 NOTE — Telephone Encounter (Signed)
LMOM informing Pt of PCP recommendations. Instructed to call if questions/concerns.  

## 2021-08-20 NOTE — Telephone Encounter (Signed)
Pt stated since starting losartan bp has been running high and she's had a slight headache.   124/84 sat 143/84 mon 149/88 today

## 2021-08-20 NOTE — Telephone Encounter (Signed)
Okay to increase losartan '50mg'$  to 2 tablets daily. Call if BP is not at goal or if she has symptoms.

## 2021-08-22 ENCOUNTER — Other Ambulatory Visit: Payer: Self-pay | Admitting: Internal Medicine

## 2021-08-23 ENCOUNTER — Other Ambulatory Visit: Payer: Self-pay

## 2021-08-23 MED ORDER — FLUTICASONE PROPIONATE 50 MCG/ACT NA SUSP: 2.0000 | Freq: Every day | NASAL | 3 refills | Status: DC

## 2021-08-26 ENCOUNTER — Other Ambulatory Visit (INDEPENDENT_AMBULATORY_CARE_PROVIDER_SITE_OTHER): Payer: BC Managed Care – PPO

## 2021-08-26 ENCOUNTER — Ambulatory Visit (INDEPENDENT_AMBULATORY_CARE_PROVIDER_SITE_OTHER): Payer: BC Managed Care – PPO | Admitting: Internal Medicine

## 2021-08-26 ENCOUNTER — Encounter: Payer: Self-pay | Admitting: Internal Medicine

## 2021-08-26 ENCOUNTER — Telehealth: Payer: Self-pay | Admitting: Internal Medicine

## 2021-08-26 VITALS — BP 144/86 | HR 60 | Temp 98.8°F | Resp 16 | Ht 68.0 in | Wt 160.0 lb

## 2021-08-26 DIAGNOSIS — M7989 Other specified soft tissue disorders: Secondary | ICD-10-CM

## 2021-08-26 DIAGNOSIS — I10 Essential (primary) hypertension: Secondary | ICD-10-CM

## 2021-08-26 LAB — BASIC METABOLIC PANEL
BUN: 8 mg/dL (ref 6–23)
CO2: 30 mEq/L (ref 19–32)
Calcium: 8.7 mg/dL (ref 8.4–10.5)
Chloride: 107 mEq/L (ref 96–112)
Creatinine, Ser: 0.66 mg/dL (ref 0.40–1.20)
GFR: 95.96 mL/min (ref >60.00)
Glucose, Bld: 94 mg/dL (ref 70–99)
Potassium: 3.8 mEq/L (ref 3.5–5.1)
Sodium: 144 mEq/L (ref 135–145)

## 2021-08-26 MED ORDER — AMLODIPINE BESYLATE 5 MG PO TABS: 5.0000 mg | ORAL_TABLET | Freq: Every day | ORAL | Status: DC

## 2021-08-26 MED ORDER — FOLIC ACID 1 MG PO TABS: 5.0000 mg | ORAL_TABLET | Freq: Every day | ORAL | 3 refills | Status: DC

## 2021-08-26 NOTE — Patient Instructions (Addendum)
Check the  blood pressure regularly BP GOAL is between 110/65 and  135/85. If it is consistently higher or lower, let me know     To find a good quality blood pressure cuff:  www.validatebp.org

## 2021-08-26 NOTE — Telephone Encounter (Signed)
Pt stated current bp is 174/102 with no sxs and thinks her medications is not working because she has not even ate today. Transferred to triage to see if pt needs to seeks immediate care.

## 2021-08-26 NOTE — Progress Notes (Unsigned)
Subjective:    Patient ID: Erin Fox, female    DOB: [birth year], 61 y.o.   MRN: [id number]  DOS:  08/26/2021 Type of visit - description: Acute, BP management  Since amlodipine was switched to losartan BP has not been as good. Lower extremity edema has not change. BP Readings from Last 3 Encounters:  08/26/21 (!) 144/86  08/12/21 122/66  05/23/21 (!) 144/87     Review of Systems See above   Past Medical History:  Diagnosis Date   Anxiety    Atypical mole 12/13/2018   right abdomen moderate   Blood transfusion without reported diagnosis    after pregnancy 5-6 units   Cyst on ear    Left ear   Diverticulosis of colon    Erythematous bladder mucosa    Hematuria    w/u neg ~ 01-2014, [id number]   Hernia, umbilical    History of colon polyps    HSV (herpes simplex virus) infection    Hyperlipidemia    Hypertension    Insomnia    Nephrolithiasis    NON-OBSTRUCTIVE- pt denies this   OAB (overactive bladder)    Superficial peroneal nerve neuropathy, 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   Wears glasses     Past Surgical History:  Procedure Laterality Date   5th toes resected bilateral  1990's   bone's removed   ABDOMINAL HYSTERECTOMY  1984   W/ UNILATERAL SALPINGOOPHORECTOMY   ABDOMINOPLASTY  1990   BREAST BIOPSY Right 01/02/2004   CESAREAN SECTION     COLONOSCOPY     COLONOSCOPY W/ POLYPECTOMY  last one 02-14-2014   COLONOSCOPY WITH PROPOFOL N/A 05/23/2021   Procedure: COLONOSCOPY WITH PROPOFOL;  Surgeon: Irving Copas., MD;  Location: [facility] ENDOSCOPY;  Service: Gastroenterology;  Laterality: N/A;   CYSTO/  RETROGRADE PYELOGRAM/  LEFT URETEROSCOPY/  BLADDER BIOPSY  08/27/2004   CYSTOSCOPY W/ RETROGRADES Bilateral 03/17/2014   Procedure: CYSTOSCOPY WITH RETROGRADE PYELOGRAM;  Surgeon: Alexis Frock, MD;  Location[facility];  Service: Urology;  Laterality: Bilateral;   CYSTOSCOPY WITH BIOPSY N/A 03/17/2014   Procedure: CYSTOSCOPY WITH BIOPSY AND  FULGERATION;  Surgeon: Alexis Frock, MD;  Location: [facility];  Service: Urology;  Laterality: N/A;   ENDOSCOPIC MUCOSAL RESECTION N/A 05/23/2021   Procedure: ENDOSCOPIC MUCOSAL RESECTION;  Surgeon: Rush Landmark Telford Nab., MD;  Location: [place];  Service: Gastroenterology;  Laterality: N/A;   HEMOSTASIS CLIP PLACEMENT  05/23/2021   Procedure: HEMOSTASIS CLIP PLACEMENT;  Surgeon: Irving Copas., MD;  Location: [place];  Service: Gastroenterology;;   OTHER SURGICAL HISTORY Left 2016   Cyst Removal on Left ear   POLYPECTOMY     POLYPECTOMY  05/23/2021   Procedure: POLYPECTOMY;  Surgeon: Irving Copas., MD;  Location: [place];  Service: Gastroenterology;;   REMOVAL BARTHOLIN CYST/ GLAND AND REVISION LEFT LABIA  06/10/2004   SUBMUCOSAL LIFTING INJECTION  05/23/2021   Procedure: SUBMUCOSAL LIFTING INJECTION;  Surgeon: Irving Copas., MD;  Location: [place];  Service: Gastroenterology;;    Current Outpatient Medications  Medication Instructions   acyclovir (ZOVIRAX) 400 mg, Oral, 2 times daily PRN   carvedilol (COREG) 12.5 mg, Oral, 2 times daily with meals   fluticasone (FLONASE) 50 MCG/ACT nasal spray 2 sprays, Each Nare, Daily   folic acid (FOLVITE) 5 mg, Oral, Daily   losartan (COZAAR) 100 mg, Oral, Daily   meloxicam (MOBIC) 7.5 mg, Oral, Daily, Always take with food   PREVIDENT 5000 BOOSTER PLUS 1.1 % PSTE 1 application ,  dental, Daily   simvastatin (ZOCOR) 20 mg, Oral, Daily at bedtime   SYMBICORT 160-4.5 MCG/ACT inhaler 2 puffs, Inhalation, 2 times daily   Vitamin D 2,000 Units, Oral, Daily       Objective:   Physical Exam BP (!) 144/86   Pulse 60   Temp 98.8 F (37.1 C) (Oral)   Resp 16   Ht '5\' 8"'$  (1.727 m)   Wt 160 lb (72.6 kg)   SpO2 95%   BMI 24.33 kg/m  General:   Well developed, NAD, BMI noted. HEENT:  Normocephalic . Face symmetric, atraumatic Lower extremities: L leg is larger than the right, at  baseline, has compression sock in place. Skin: Not pale. Not jaundice Neurologic:  alert & oriented X3.  Speech normal, gait appropriate for age and unassisted Psych--  Cognition and judgment appear intact.  Cooperative with normal attention span and concentration.  Behavior appropriate. No anxious or depressed appearing.      Assessment     Assessment HTN: Onset 07-2016 Anxiety, insomnia Hyperlipidemia Reacive airway DZ dx 2019 HSV L leg edema,Venous insufficiency, since the 80s after she had her child. Over active bladder, s/p cysto   >1, 2011, 2016, etc, see urology notes  Non-obstructive nephrolithiasis Hemorrhoids- banding x 3 2020 IBS? Sees dermatology regularly Mild folic acid deficiency (labs 01/2020).  PLAN Left leg edema:  On 08/12/2021 with stop amlodipine hoping that that will help with swelling, on 08/20/2021 dose was increased to 100 mg daily because BP was not well controlled, subsequently BMP was okay. At this point, BP is not as well-controlled as before and the swelling of the leg has not improved at all. BP yesterday 154/92, 174/92. Plan: Stop losartan, go back on amlodipine, continue carvedilol, continue monitoring leg swelling. She agreed with plan. HTN: See above RTC by 02-2018 for CPX  7-3 Currently on amlodipine and carvedilol, ambulatory BPs range from normal to slightly high sometimes. Was advised by a vein specialist to stop amlodipine due to chronic left leg swelling. Plan: DC amlodipine, start losartan 50, monitor BPs, BMP in 2 to 3 weeks, low-salt diet. L leg edema, chronic issue, working with a vein specialist, they are planning some procedures.  She uses compression stockings most days with good control of symptoms. RTC labs 2 to 3 weeks.   RTC CPX 02/2022

## 2021-08-26 NOTE — Telephone Encounter (Signed)
Pt scheduled w/ PCP today.

## 2021-08-27 NOTE — Assessment & Plan Note (Signed)
Left leg edema:  On 08/12/2021 we stopped amlodipine hoping that that will help with L leg swelling, on 08/20/2021 dose was increased to 100 mg daily because BP was not well controlled, subsequently BMP was okay. At this point, BP is not as well-controlled as before and the swelling of the leg has not improved at all. BP yesterday 154/92, 174/92. Plan: Stop losartan, go back on amlodipine, continue carvedilol, continue monitoring leg swelling. She agreed with plan. HTN: See above RTC by 02-2018 for CPX

## 2021-09-11 ENCOUNTER — Ambulatory Visit
Admission: RE | Admit: 2021-09-11 | Discharge: 2021-09-11 | Disposition: A | Discharge status: Home / Self Care | Payer: BC Managed Care – PPO | Source: Ambulatory Visit | Attending: Internal Medicine | Admitting: Internal Medicine

## 2021-09-11 DIAGNOSIS — J069 Acute upper respiratory infection, unspecified: Secondary | ICD-10-CM | POA: Diagnosis not present

## 2021-09-11 DIAGNOSIS — R8281 Pyuria: Secondary | ICD-10-CM | POA: Diagnosis not present

## 2021-09-11 DIAGNOSIS — Z1231 Encounter for screening mammogram for malignant neoplasm of breast: Secondary | ICD-10-CM

## 2021-09-11 DIAGNOSIS — M549 Dorsalgia, unspecified: Secondary | ICD-10-CM | POA: Diagnosis not present

## 2021-09-11 DIAGNOSIS — R059 Cough, unspecified: Secondary | ICD-10-CM | POA: Diagnosis not present

## 2021-09-25 ENCOUNTER — Other Ambulatory Visit: Payer: Self-pay | Admitting: Internal Medicine

## 2021-09-25 MED ORDER — AMLODIPINE BESYLATE 5 MG PO TABS: 5.0000 mg | ORAL_TABLET | Freq: Every day | ORAL | 1 refills | Status: DC

## 2021-10-08 ENCOUNTER — Ambulatory Visit (INDEPENDENT_AMBULATORY_CARE_PROVIDER_SITE_OTHER): Payer: BC Managed Care – PPO | Admitting: Family

## 2021-10-08 ENCOUNTER — Encounter: Payer: Self-pay | Admitting: Family

## 2021-10-08 VITALS — BP 130/70 | HR 59 | Temp 98.6°F | Ht 67.0 in | Wt 155.4 lb

## 2021-10-08 DIAGNOSIS — J209 Acute bronchitis, unspecified: Secondary | ICD-10-CM

## 2021-10-08 MED ORDER — BENZONATATE 100 MG PO CAPS: 100.0000 mg | ORAL_CAPSULE | Freq: Three times a day (TID) | ORAL | 0 refills | Status: DC | PRN

## 2021-10-08 MED ORDER — PREDNISONE 20 MG PO TABS: ORAL_TABLET | ORAL | 0 refills | Status: DC

## 2021-10-08 MED ORDER — DOXYCYCLINE HYCLATE 100 MG PO TABS: 100.0000 mg | ORAL_TABLET | Freq: Two times a day (BID) | ORAL | 0 refills | Status: DC

## 2021-10-08 NOTE — Progress Notes (Signed)
Erin Fox is a 61 y.o. 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   Reactive airway disease 07/28/2017   Hypertension 09/03/2016   PCP NOTES >>>>>>>>>>>>>>>>>> 05/08/2015   Epidermoid cyst of skin of ear 06/06/2014   Hematuria 04/11/2014   Back pain 04/11/2014   Vitamin D deficiency 07/22/2013   Hyperlipidemia 03/08/2013   Annual physical exam 11/05/2010   HIP PAIN, LEFT 01/01/2010   Anxiety state 08/03/2009   Insomnia-PCP notes 11/11/2006   HSV 05/22/2006   Dyslipidemia 05/22/2006   Left leg swelling 05/22/2006    Current Outpatient Medications  Medication Sig Dispense Refill   acyclovir (ZOVIRAX) 400 MG tablet Take 1 tablet (400 mg total) by mouth 2 (two) times daily as needed. 180 tablet 3   amLODipine (NORVASC) 5 MG tablet Take 1 tablet (5 mg total) by mouth daily. 90 tablet 1   benzonatate (TESSALON) 100 MG capsule Take 1 capsule (100 mg total) by mouth 3 (three) times daily as needed. 30 capsule 0   carvedilol (COREG) 12.5 MG tablet Take 1 tablet (12.5 mg total) by mouth 2 (two) times daily with a meal. 180 tablet 1   Cholecalciferol (VITAMIN D) 50 MCG (2000 UT) tablet Take 2,000 Units by mouth daily.     doxycycline (VIBRA-TABS) 100 MG tablet Take 1 tablet (100 mg total) by mouth 2 (two) times daily. 14 tablet 0   fluticasone (FLONASE) 50 MCG/ACT nasal spray Place 2 sprays into both nostrils daily. 48 g 3   folic acid (FOLVITE) 1 MG tablet Take 5 tablets (5 mg total) by mouth daily. 450 tablet 3   predniSONE (DELTASONE) 20 MG tablet Take 2 tablets daily x 2 days, then 1 tablet daily x 5 days 9 tablet 0   PREVIDENT 5000 BOOSTER PLUS 1.1 % PSTE Place 1 application. onto teeth daily.     simvastatin (ZOCOR) 20 MG tablet Take 1 tablet (20 mg total) by mouth at bedtime. 90 tablet 1   SYMBICORT 160-4.5 MCG/ACT inhaler  Inhale 2 puffs by mouth twice daily 30.6 g 1   meloxicam (MOBIC) 7.5 MG tablet Take 1 tablet (7.5 mg total) by mouth daily. Always take with food (Patient not taking: Reported on 10/08/2021) 30 tablet 0   No current facility-administered medications for this visit.    Allergies: Patient has no known allergies.  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   Wears glasses     Past Surgical History:  Procedure Laterality Date   5th toes resected bilateral  1990's   bone's removed   ABDOMINAL HYSTERECTOMY  1984   W/ UNILATERAL SALPINGOOPHORECTOMY   ABDOMINOPLASTY  1990   BREAST BIOPSY Right 2001  CESAREAN SECTION     COLONOSCOPY     COLONOSCOPY W/ POLYPECTOMY  last one 02-14-2014   COLONOSCOPY WITH PROPOFOL N/A 05/23/2021   Procedure: COLONOSCOPY WITH PROPOFOL;  Surgeon: Rush Landmark Telford Nab., MD;  Location: [place];  Service: Gastroenterology;  Laterality: N/A;   CYSTO/  RETROGRADE PYELOGRAM/  LEFT URETEROSCOPY/  BLADDER BIOPSY  08/27/2004   CYSTOSCOPY W/ RETROGRADES Bilateral 03/17/2014   Procedure: CYSTOSCOPY WITH RETROGRADE PYELOGRAM;  Surgeon: Alexis Frock, MD;  Location: [facility];  Service: Urology;  Laterality: Bilateral;   CYSTOSCOPY WITH BIOPSY N/A 03/17/2014   Procedure: CYSTOSCOPY WITH BIOPSY AND FULGERATION;  Surgeon: Alexis Frock, MD;  Location: [facility];  Service: Urology;  Laterality: N/A;    ENDOSCOPIC MUCOSAL RESECTION N/A 05/23/2021   Procedure: ENDOSCOPIC MUCOSAL RESECTION;  Surgeon: Rush Landmark Telford Nab., MD;  Location: [place];  Service: Gastroenterology;  Laterality: N/A;   HEMOSTASIS CLIP PLACEMENT  05/23/2021   Procedure: HEMOSTASIS CLIP PLACEMENT;  Surgeon: Irving Copas., MD;  Location: [place];  Service: Gastroenterology;;   OTHER SURGICAL HISTORY Left 2016   Cyst Removal on Left ear   POLYPECTOMY     POLYPECTOMY  05/23/2021   Procedure: POLYPECTOMY;  Surgeon: Mansouraty, Telford Nab., MD;  Location: [place];  Service: Gastroenterology;;   REMOVAL BARTHOLIN CYST/ GLAND AND REVISION LEFT LABIA  06/10/2004   SUBMUCOSAL LIFTING INJECTION  05/23/2021   Procedure: SUBMUCOSAL LIFTING INJECTION;  Surgeon: Irving Copas., MD;  Location: [facility] ENDOSCOPY;  Service: Gastroenterology;;    Family History  Problem Relation Age of Onset   Colon polyps Mother    Hyperlipidemia Mother    Hypertension Mother        M and sister    Diabetes Father    Colon polyps Sister 23       8 polyps removed   High blood pressure Sister    Cancer Paternal Aunt        lung   Colon cancer Paternal Uncle    Colon cancer Other        uncle , dx at age 61s   Breast cancer Neg Hx    Esophageal cancer Neg Hx    Rectal cancer Neg Hx    Stomach cancer Neg Hx     Social History   Tobacco Use   Smoking status: Every Day    Packs/day: 0.50    Years: 30.00    Total pack years: 15.00    Types: Cigarettes   Smokeless tobacco: Never   Tobacco comments:    3/4  ppd  Substance Use Topics   Alcohol use: Yes    Alcohol/week: 0.0 standard drinks of alcohol    Comment: OCCASIONAL    Subjective:   Seen at [facility] on 09/11/2021 with suspected viral URI/ allergies;  had normal CXR to rule out pneumonia; good response to Atarax and Tessalon Perles; symptoms had done well for the past 2 weeks and have recently re-flared;   Objective:  Vitals:   10/08/21 1129  BP: 130/70   Pulse: (!) 59  Temp: 98.6 F (37 C)  TempSrc: Oral  SpO2: 98%  Weight: 155 lb 6.4 oz (70.5 kg)  Height: '5\' 7"'$  (1.702 m)    General: Well developed, well nourished, in no acute distress  Skin : Warm and dry.  Head: Normocephalic and atraumatic  Eyes: Sclera and conjunctiva clear; pupils round and reactive to light; extraocular movements intact  Ears: External normal; canals clear; tympanic membranes normal  Oropharynx:  Pink, supple. No suspicious lesions  Neck: Supple without thyromegaly, adenopathy  Lungs: Respirations unlabored; wheezing noted in upper lobes; CVS exam: normal rate and regular rhythm.  Neurologic: Alert and oriented; speech intact; face symmetrical; moves all extremities well; CNII-XII intact without focal deficit   Assessment:  1. Acute bronchitis, unspecified organism     Plan:  Rx for Doxycycline and Prednisone- take as directed; increase fluids, rest and follow up worse, no better; she had CXR at [facility] 2 weeks ago so will hold today but to consider other imaging if symptoms persist.   No follow-ups on file.  No orders of the defined types were placed in this encounter.   Requested Prescriptions   Signed Prescriptions Disp Refills   doxycycline (VIBRA-TABS) 100 MG tablet 14 tablet 0    Sig: Take 1 tablet (100 mg total) by mouth 2 (two) times daily.   predniSONE (DELTASONE) 20 MG tablet 9 tablet 0    Sig: Take 2 tablets daily x 2 days, then 1 tablet daily x 5 days   benzonatate (TESSALON) 100 MG capsule 30 capsule 0    Sig: Take 1 capsule (100 mg total) by mouth 3 (three) times daily as needed.

## 2021-10-21 ENCOUNTER — Other Ambulatory Visit: Payer: Self-pay | Admitting: Internal Medicine

## 2021-10-31 ENCOUNTER — Ambulatory Visit: Payer: BC Managed Care – PPO | Admitting: Family Medicine

## 2021-11-07 ENCOUNTER — Other Ambulatory Visit: Payer: Self-pay | Admitting: Internal Medicine

## 2022-02-25 DIAGNOSIS — M79662 Pain in left lower leg: Secondary | ICD-10-CM | POA: Diagnosis not present

## 2022-02-25 DIAGNOSIS — M79605 Pain in left leg: Secondary | ICD-10-CM | POA: Diagnosis not present

## 2022-02-25 DIAGNOSIS — I83892 Varicose veins of left lower extremities with other complications: Secondary | ICD-10-CM | POA: Diagnosis not present

## 2022-02-25 DIAGNOSIS — I8312 Varicose veins of left lower extremity with inflammation: Secondary | ICD-10-CM | POA: Diagnosis not present

## 2022-02-27 ENCOUNTER — Encounter: Payer: BC Managed Care – PPO | Admitting: Internal Medicine

## 2022-03-21 ENCOUNTER — Other Ambulatory Visit: Payer: Self-pay | Admitting: Internal Medicine

## 2022-03-28 ENCOUNTER — Ambulatory Visit (INDEPENDENT_AMBULATORY_CARE_PROVIDER_SITE_OTHER): Payer: BC Managed Care – PPO | Admitting: Internal Medicine

## 2022-03-28 ENCOUNTER — Encounter: Payer: Self-pay | Admitting: Internal Medicine

## 2022-03-28 VITALS — BP 124/66 | HR 56 | Temp 98.2°F | Resp 16 | Ht 67.0 in | Wt 161.1 lb

## 2022-03-28 DIAGNOSIS — E538 Deficiency of other specified B group vitamins: Secondary | ICD-10-CM

## 2022-03-28 DIAGNOSIS — I10 Essential (primary) hypertension: Secondary | ICD-10-CM | POA: Diagnosis not present

## 2022-03-28 DIAGNOSIS — Z0000 Encounter for general adult medical examination without abnormal findings: Secondary | ICD-10-CM

## 2022-03-28 DIAGNOSIS — Z87891 Personal history of nicotine dependence: Secondary | ICD-10-CM

## 2022-03-28 DIAGNOSIS — Z122 Encounter for screening for malignant neoplasm of respiratory organs: Secondary | ICD-10-CM

## 2022-03-28 DIAGNOSIS — E785 Hyperlipidemia, unspecified: Secondary | ICD-10-CM

## 2022-03-28 DIAGNOSIS — Z780 Asymptomatic menopausal state: Secondary | ICD-10-CM

## 2022-03-28 LAB — CBC WITH DIFFERENTIAL/PLATELET
Basophils Absolute: 0 10*3/uL (ref 0.0–0.1)
Basophils Relative: 0.8 % (ref 0.0–3.0)
Eosinophils Absolute: 0.1 10*3/uL (ref 0.0–0.7)
Eosinophils Relative: 2.6 % (ref 0.0–5.0)
HCT: 39.3 % (ref 36.0–46.0)
Hemoglobin: 13.3 g/dL (ref 12.0–15.0)
Lymphocytes Relative: 32.8 % (ref 12.0–46.0)
Lymphs Abs: 1.3 10*3/uL (ref 0.7–4.0)
MCHC: 33.9 g/dL (ref 30.0–36.0)
MCV: 97.8 fl (ref 78.0–100.0)
Monocytes Absolute: 0.5 10*3/uL (ref 0.1–1.0)
Monocytes Relative: 12.7 % — ABNORMAL HIGH (ref 3.0–12.0)
Neutro Abs: 2 10*3/uL (ref 1.4–7.7)
Neutrophils Relative %: 51.1 % (ref 43.0–77.0)
Platelets: 235 10*3/uL (ref 150.0–400.0)
RBC: 4.02 Mil/uL (ref 3.87–5.11)
RDW: 13.5 % (ref 11.5–15.5)
WBC: 3.8 10*3/uL — ABNORMAL LOW (ref 4.0–10.5)

## 2022-03-28 LAB — LIPID PANEL
Cholesterol: 213 mg/dL — ABNORMAL HIGH (ref 0–200)
HDL: 112.2 mg/dL (ref >39.00)
LDL Cholesterol: 80 mg/dL (ref 0–99)
NonHDL: 100.43
Total CHOL/HDL Ratio: 2
Triglycerides: 103 mg/dL (ref 0.0–149.0)
VLDL: 20.6 mg/dL (ref 0.0–40.0)

## 2022-03-28 LAB — COMPREHENSIVE METABOLIC PANEL
ALT: 19 U/L (ref 0–35)
AST: 20 U/L (ref 0–37)
Albumin: 4.3 g/dL (ref 3.5–5.2)
Alkaline Phosphatase: 77 U/L (ref 39–117)
BUN: 12 mg/dL (ref 6–23)
CO2: 31 mEq/L (ref 19–32)
Calcium: 9.4 mg/dL (ref 8.4–10.5)
Chloride: 105 mEq/L (ref 96–112)
Creatinine, Ser: 0.64 mg/dL (ref 0.40–1.20)
GFR: 96.28 mL/min (ref >60.00)
Glucose, Bld: 80 mg/dL (ref 70–99)
Potassium: 3.5 mEq/L (ref 3.5–5.1)
Sodium: 143 mEq/L (ref 135–145)
Total Bilirubin: 0.8 mg/dL (ref 0.2–1.2)
Total Protein: 6.7 g/dL (ref 6.0–8.3)

## 2022-03-28 LAB — B12 AND FOLATE PANEL
Folate: 6 ng/mL (ref >5.9)
Vitamin B-12: 96 pg/mL — ABNORMAL LOW (ref 211–911)

## 2022-03-28 LAB — TSH: TSH: 3.75 u[IU]/mL (ref 0.35–5.50)

## 2022-03-28 NOTE — Patient Instructions (Addendum)
Vaccines I recommend:  RSV vaccine  Call gastroenterology and schedule your next colonoscopy.  [phone]  GO TO THE LAB : Get the blood work     [place], [place] back for a checkup in 6 months  STOP BY THE FIRST FLOOR: Schedule a bone density test    "[place] of attorney" ,  "Living will" (Advance care planning documents)  If you already have a living will or healthcare power of attorney, is recommended you bring the copy to be scanned in your chart.   The document will be available to all the doctors you see in the system.  Advance care planning is a process that supports adults in  understanding and sharing their preferences regarding future medical care.  The patient's preferences are recorded in documents called Advance Directives and the can be modified at any time while the patient is in full mental capacity.   If you don't have one, please consider create one.      More information at: [email or web address]

## 2022-03-28 NOTE — Progress Notes (Unsigned)
Subjective:    Patient ID: Erin Fox, female    DOB: [birth year], 62 y.o.   MRN: [id number]  DOS:  03/28/2022 Type of visit - description: cpx  Since the last office visit is doing well.  No major concerns. Chronic medical problems were evaluated  Review of Systems See above   Past Medical History:  Diagnosis Date   Anxiety    Atypical mole 12/13/2018   right abdomen moderate   Blood transfusion without reported diagnosis    after pregnancy 5-6 units   Cyst on ear    Left ear   Diverticulosis of colon    Erythematous bladder mucosa    Hematuria    w/u neg ~ 01-2014, [id number]   Hernia, umbilical    History of colon polyps    HSV (herpes simplex virus) infection    Hyperlipidemia    Hypertension    Insomnia    Nephrolithiasis    NON-OBSTRUCTIVE- pt denies this   OAB (overactive bladder)    Superficial peroneal nerve neuropathy, 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   Wears glasses     Past Surgical History:  Procedure Laterality Date   5th toes resected bilateral  1990's   bone's removed   ABDOMINAL HYSTERECTOMY  1984   W/ UNILATERAL SALPINGOOPHORECTOMY   ABDOMINOPLASTY  1990   BREAST BIOPSY Right 2001   CESAREAN SECTION     COLONOSCOPY     COLONOSCOPY W/ POLYPECTOMY  last one 02-14-2014   COLONOSCOPY WITH PROPOFOL N/A 05/23/2021   Procedure: COLONOSCOPY WITH PROPOFOL;  Surgeon: Irving Copas., MD;  Location: [facility] ENDOSCOPY;  Service: Gastroenterology;  Laterality: N/A;   CYSTO/  RETROGRADE PYELOGRAM/  LEFT URETEROSCOPY/  BLADDER BIOPSY  08/27/2004   CYSTOSCOPY W/ RETROGRADES Bilateral 03/17/2014   Procedure: CYSTOSCOPY WITH RETROGRADE PYELOGRAM;  Surgeon: Alexis Frock, MD;  Location: [facility];  Service: Urology;  Laterality: Bilateral;   CYSTOSCOPY WITH BIOPSY N/A 03/17/2014   Procedure: CYSTOSCOPY WITH BIOPSY AND FULGERATION;  Surgeon: Alexis Frock, MD;  Location: [facility];  Service: Urology;  Laterality: N/A;   ENDOSCOPIC  MUCOSAL RESECTION N/A 05/23/2021   Procedure: ENDOSCOPIC MUCOSAL RESECTION;  Surgeon: Rush Landmark Telford Nab., MD;  Location: [place];  Service: Gastroenterology;  Laterality: N/A;   HEMOSTASIS CLIP PLACEMENT  05/23/2021   Procedure: HEMOSTASIS CLIP PLACEMENT;  Surgeon: Irving Copas., MD;  Location: [place];  Service: Gastroenterology;;   OTHER SURGICAL HISTORY Left 2016   Cyst Removal on Left ear   POLYPECTOMY     POLYPECTOMY  05/23/2021   Procedure: POLYPECTOMY;  Surgeon: Irving Copas., MD;  Location: [place];  Service: Gastroenterology;;   REMOVAL BARTHOLIN CYST/ GLAND AND REVISION LEFT LABIA  06/10/2004   SUBMUCOSAL LIFTING INJECTION  05/23/2021   Procedure: SUBMUCOSAL LIFTING INJECTION;  Surgeon: Irving Copas., MD;  Location: [place];  Service: Gastroenterology;;    Current Outpatient Medications  Medication Instructions   acyclovir (ZOVIRAX) 400 mg, Oral, 2 times daily PRN   amLODipine (NORVASC) 5 mg, Oral, Daily   benzonatate (TESSALON) 100 mg, Oral, 3 times daily PRN   carvedilol (COREG) 12.5 mg, Oral, 2 times daily with meals   doxycycline (VIBRA-TABS) 100 mg, Oral, 2 times daily   fluticasone (FLONASE) 50 MCG/ACT nasal spray 2 sprays, Each Nare, Daily   folic acid (FOLVITE) 5 mg, Oral, Daily   meloxicam (MOBIC) 7.5 mg, Oral, Daily, Always take with food   predniSONE (DELTASONE) 20 MG tablet Take 2 tablets daily x 2 days,  then 1 tablet daily x 5 days   PREVIDENT 5000 BOOSTER PLUS 1.1 % PSTE 1 application , dental, Daily   simvastatin (ZOCOR) 20 mg, Oral, Daily at bedtime   SYMBICORT 160-4.5 MCG/ACT inhaler 2 puffs, Inhalation, 2 times daily   Vitamin D 2,000 Units, Oral, Daily       Objective:   Physical Exam BP 124/66   Pulse (!) 56   Temp 98.2 F (36.8 C) (Oral)   Resp 16   Ht 5' 7"$  (1.702 m)   Wt 161 lb 2 oz (73.1 kg)   SpO2 97%   BMI 25.24 kg/m  General: Well developed, NAD, BMI noted Neck: No  thyromegaly   HEENT:  Normocephalic . Face symmetric, atraumatic Lungs:  CTA B Normal respiratory effort, no intercostal retractions, no accessory muscle use. Heart: RRR,  no murmur.  Abdomen:  Not distended, soft, non-tender. No rebound or rigidity.   Lower extremities: Left leg edema at baseline Skin: Exposed areas without rash. Not pale. Not jaundice Neurologic:  alert & oriented X3.  Speech normal, gait appropriate for age and unassisted Strength symmetric and appropriate for age.  Psych: Cognition and judgment appear intact.  Cooperative with normal attention span and concentration.  Behavior appropriate. No anxious or depressed appearing.     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 Over active bladder, s/p cysto   >1, 2011, 2016, etc, see urology notes  Non-obstructive nephrolithiasis Hemorrhoids- banding x 3 2020 IBS? Sees dermatology regularly Mild folic acid deficiency (labs 01/2020).  PLAN Here for CPX HTN, BP is normal, no recent ambulatory BPs, continue amlodipine, carvedilol, check labs Anxiety, insomnia: Not a major issue at this point Hyperlipidemia: On simvastatin, checking labs Reactive airway disease: On Symbicort as needed Folic acid deficiency: On supplements, check 123456 and folic acid. RTC 6 months for a checkup   - Td 2016  - s/p shingrex - RSV d/w pt -COVID-vaccine: Up-to-date. - s/p flu shot   -- h/o hysterectomy for benign reasons,saw gyn  had a Pap 02/01/2018 ([facility]); plans to see gyn 2024  --MMG 09-2021 ([facility]) --CCS: Cscope 11/ 2010: Polyps, diverticula, hemorrhoids. Cscope 1-16, no polyps, next 7 years.  C-scope 02/22/2021, next this year per pt, she plans to call  --Tobacco abuse: Smoking mostly 1/2 ppd, at times 3/4 ppd, started at age [age], pack per year: Very close to 27, borderline indication for CT, this  was discussed with the patient.  Elected a referral.  Will do Encouraged to quit tobacco. Bones: Check DEXA --Diet and exercise: Room for improvement on exercise, counseled.  Diet is healthy most of the time --Labs: CMP FLP CBC TSH folic acid 123456 -ACP info provided    7=== Left leg edema:  On 08/12/2021 we stopped amlodipine hoping that that will help with L leg swelling, on 08/20/2021 dose was increased to 100 mg daily because BP was not well controlled, subsequently BMP was okay. At this point, BP is not as well-controlled as before and the swelling of the leg has not improved at all. BP yesterday 154/92, 174/92. Plan: Stop losartan, go back on amlodipine, continue carvedilol, continue monitoring leg swelling. She agreed with plan. HTN: See above RTC by 02-2018 for CPX

## 2022-03-29 ENCOUNTER — Encounter: Payer: Self-pay | Admitting: Internal Medicine

## 2022-03-29 NOTE — Assessment & Plan Note (Signed)
-   Td 2016  - s/p shingrex - RSV d/w pt -COVID-vaccine: Up-to-date. - s/p flu shot   -- h/o hysterectomy for benign reasons,saw gyn  had a Pap 02/01/2018 (K PN); plans to see gyn 2024  --MMG 09-2021 ([facility]) --CCS: Cscope 11/ 2010: Polyps, diverticula, hemorrhoids. Cscope 1-16, no polyps, next 7 years.  C-scope 02/22/2021, next this year per pt, she plans to call  --Tobacco abuse: Smoking mostly 1/2 ppd, at times 3/4 ppd, started at age [age], pack per year: Very close to 47, borderline indication for CT, this was discussed with the patient.  Elected a referral.  Will do Encouraged to quit tobacco. --Bones: Check DEXA --Diet and exercise: Room for improvement on exercise, counseled.  Diet is healthy most of the time --Labs: CMP FLP CBC TSH folic acid 123456 -ACP info provided

## 2022-03-29 NOTE — Assessment & Plan Note (Signed)
Here for CPX HTN, BP is normal, no recent ambulatory BPs, continue amlodipine, carvedilol, check labs Anxiety, insomnia: Not a major issue at this point Hyperlipidemia: On simvastatin, checking labs Reactive airway disease: On Symbicort as needed Folic acid deficiency: On supplements, check 123456 and folic acid. RTC 6 months for a checkup

## 2022-03-30 ENCOUNTER — Other Ambulatory Visit: Payer: Self-pay | Admitting: Internal Medicine

## 2022-03-30 DIAGNOSIS — R509 Fever, unspecified: Secondary | ICD-10-CM | POA: Diagnosis not present

## 2022-03-30 DIAGNOSIS — R0982 Postnasal drip: Secondary | ICD-10-CM | POA: Diagnosis not present

## 2022-03-30 DIAGNOSIS — R062 Wheezing: Secondary | ICD-10-CM | POA: Diagnosis not present

## 2022-03-30 DIAGNOSIS — R059 Cough, unspecified: Secondary | ICD-10-CM | POA: Diagnosis not present

## 2022-03-30 MED ORDER — ASPIRIN 81 MG PO TBEC: 81.0000 mg | DELAYED_RELEASE_TABLET | Freq: Every day | ORAL | Status: DC

## 2022-03-31 ENCOUNTER — Ambulatory Visit (HOSPITAL_BASED_OUTPATIENT_CLINIC_OR_DEPARTMENT_OTHER)
Admission: RE | Admit: 2022-03-31 | Discharge: 2022-03-31 | Disposition: A | Discharge status: Home / Self Care | Payer: BC Managed Care – PPO | Source: Ambulatory Visit | Attending: Internal Medicine | Admitting: Internal Medicine

## 2022-03-31 DIAGNOSIS — Z780 Asymptomatic menopausal state: Secondary | ICD-10-CM | POA: Insufficient documentation

## 2022-03-31 DIAGNOSIS — E538 Deficiency of other specified B group vitamins: Secondary | ICD-10-CM | POA: Insufficient documentation

## 2022-03-31 DIAGNOSIS — M8589 Other specified disorders of bone density and structure, multiple sites: Secondary | ICD-10-CM | POA: Diagnosis not present

## 2022-04-07 ENCOUNTER — Telehealth: Payer: Self-pay | Admitting: Podiatry

## 2022-04-07 ENCOUNTER — Telehealth: Payer: Self-pay | Admitting: Internal Medicine

## 2022-04-07 NOTE — Telephone Encounter (Signed)
Noted  

## 2022-04-07 NOTE — Telephone Encounter (Signed)
Patient wants to know if its ok for her to reorder the orthotics she had made in 2022? She accidentally threw them away?  Please advise

## 2022-04-07 NOTE — Telephone Encounter (Signed)
Pt called stating that she need to switch over the the generic of Symbicort as her pharmacy notified her about switching to ensure coverage. Pt does not need a refill at this time.

## 2022-04-10 ENCOUNTER — Other Ambulatory Visit: Payer: Self-pay | Admitting: Internal Medicine

## 2022-04-20 IMAGING — MG MM DIGITAL SCREENING BILAT W/ TOMO AND CAD
8 series · 8 of 24 positions shown · non-contrast
Comparison: Previous exam(s).

CLINICAL DATA: Screening.

EXAM:
DIGITAL SCREENING BILATERAL MAMMOGRAM WITH TOMOSYNTHESIS AND CAD
TECHNIQUE: Bilateral screening digital craniocaudal and mediolateral oblique
mammograms were obtained. Bilateral screening digital breast
tomosynthesis was performed. The images were evaluated with
computer-aided detection.

[L MLO synth-2D]
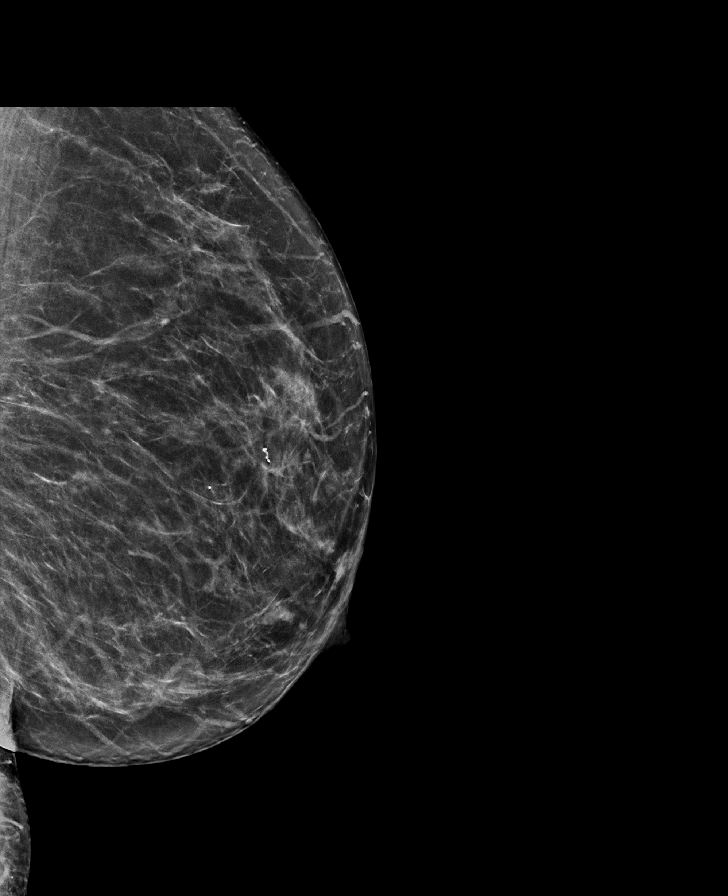

[R CC synth-2D]
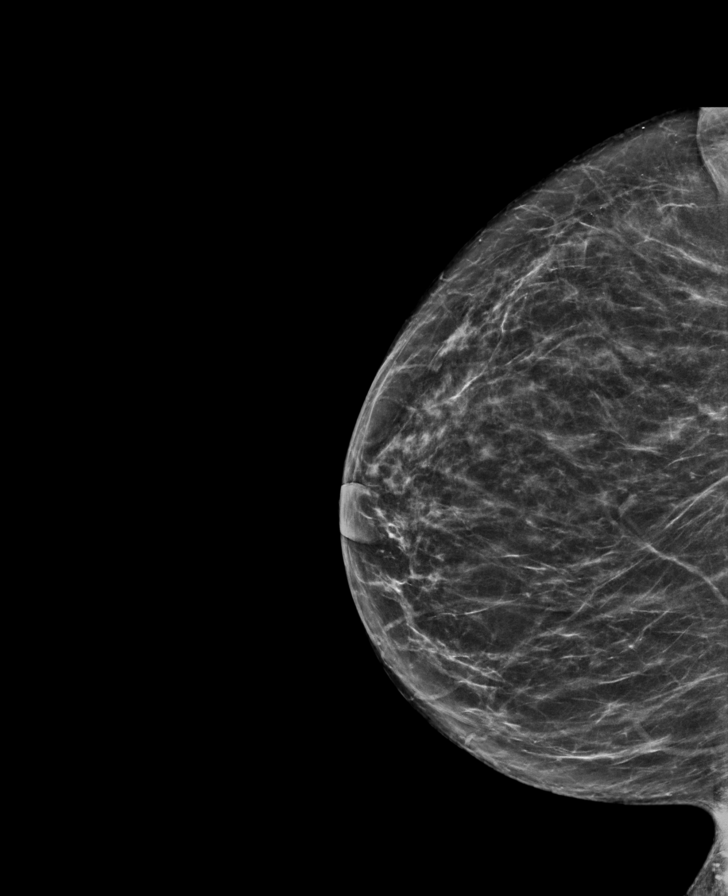

[R MLO synth-2D]
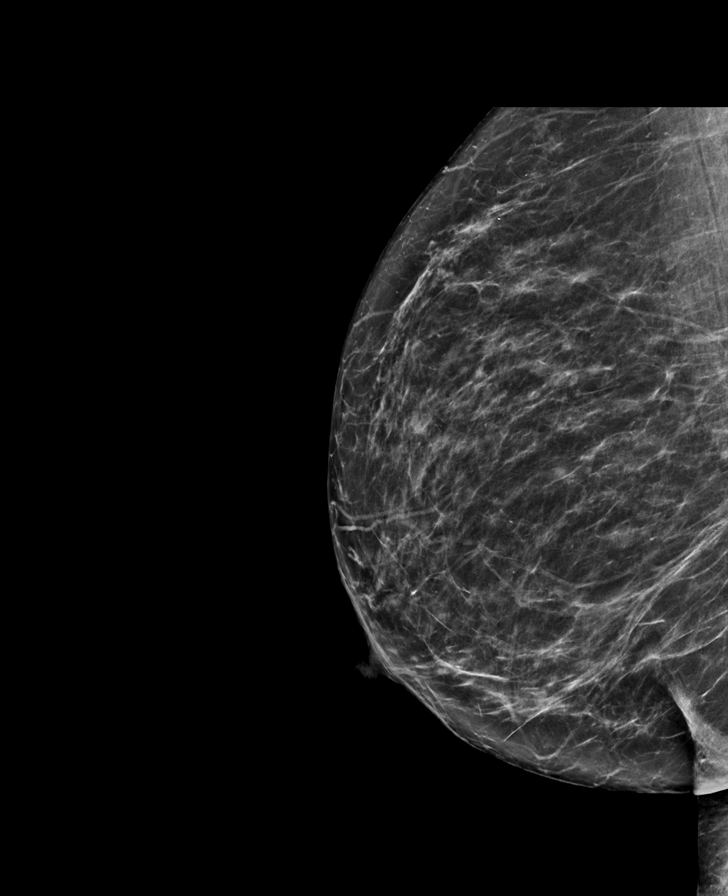

[L CC synth-2D]
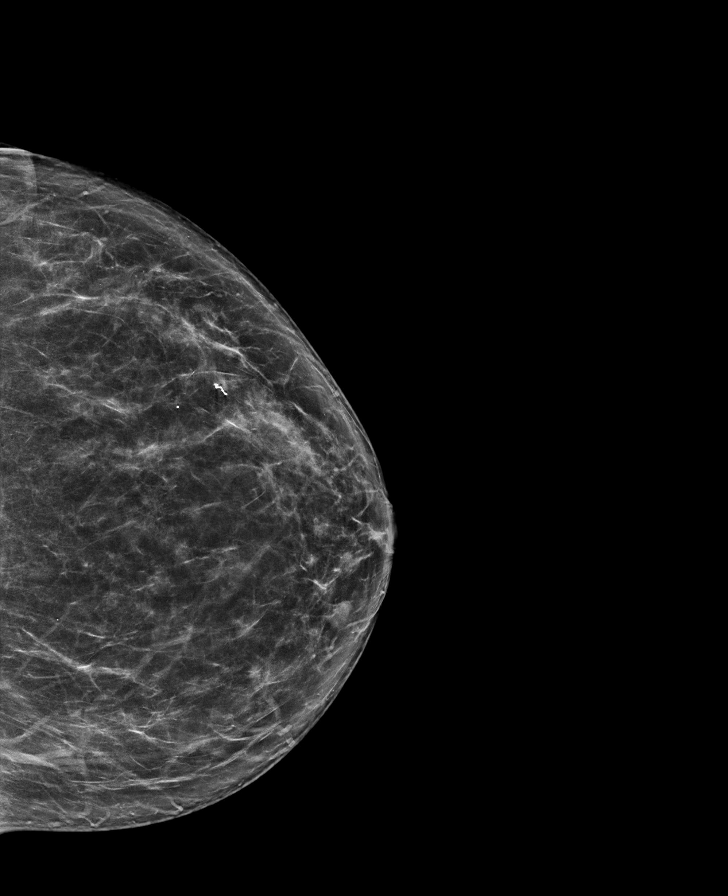

[L MLO tomo · tomo slice 37/74.0]
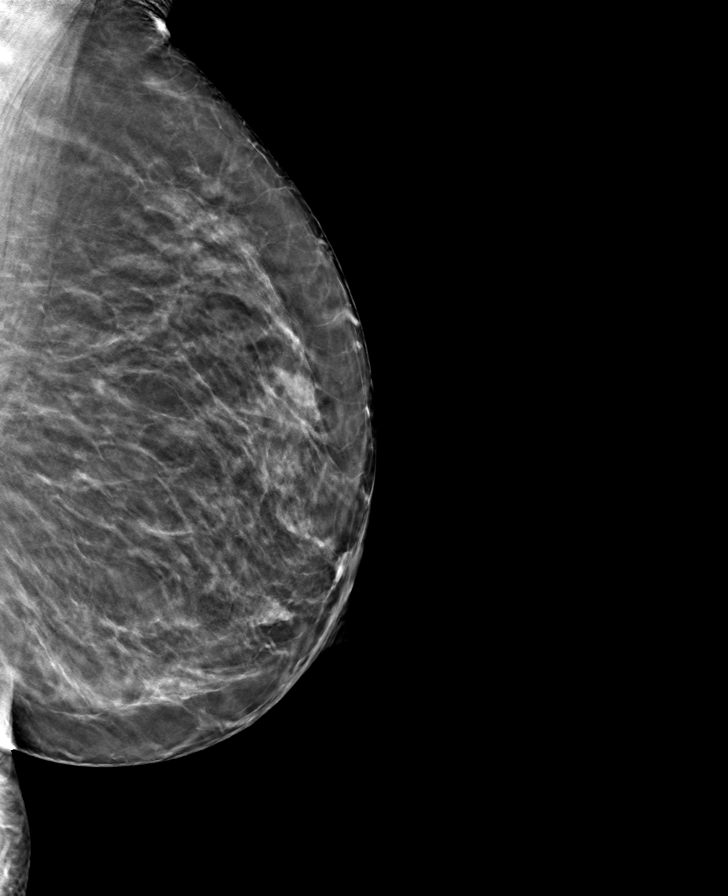

[R MLO tomo · tomo slice 35/69.0]
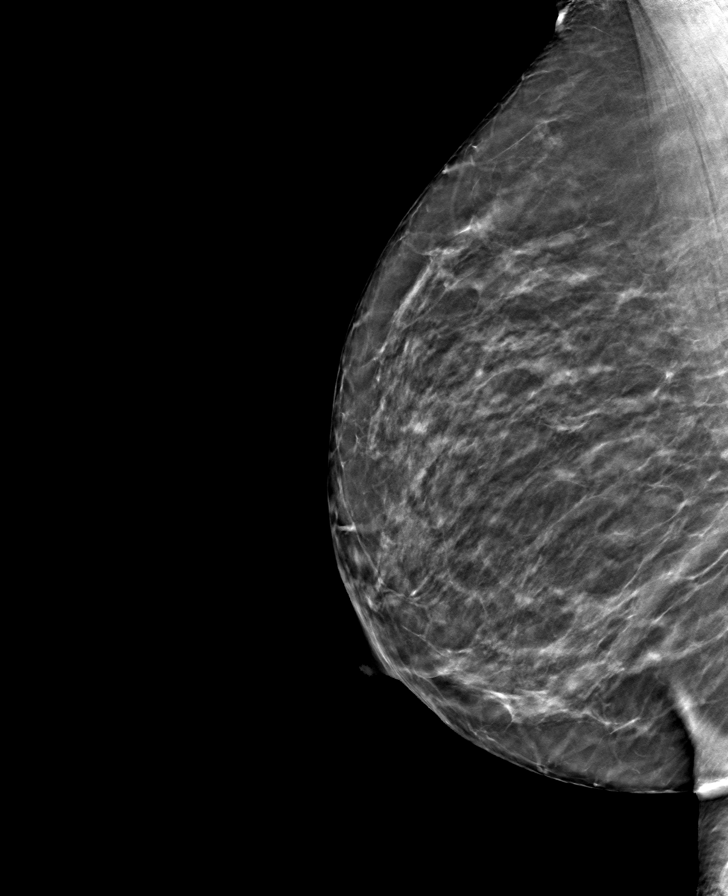

[L CC tomo · tomo slice 39/78.0]
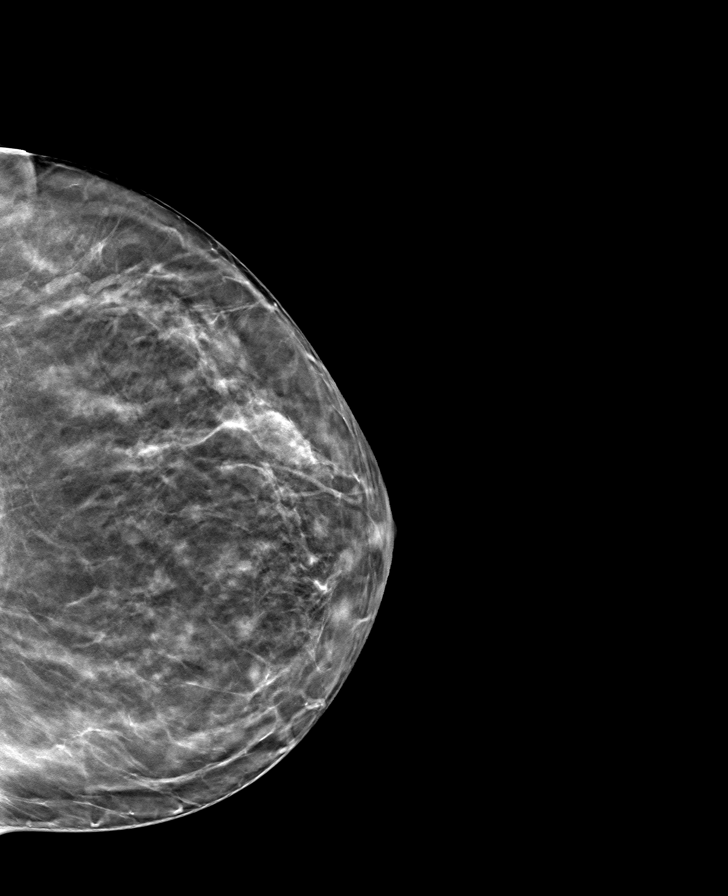

[R CC tomo · tomo slice 37/73.0]
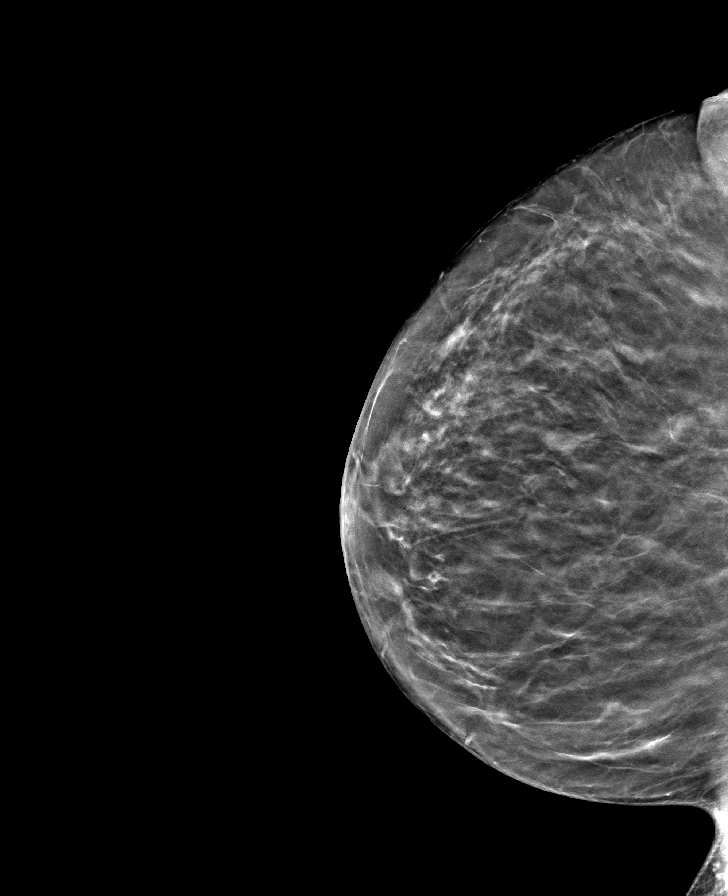

[8 of 24 positions shown; findings below may reference images not displayed]

ACR Breast Density Category b: There are scattered areas of
fibroglandular density.
FINDINGS: There are no findings suspicious for malignancy.
IMPRESSION: No mammographic evidence of malignancy. A result letter of this
screening mammogram will be mailed directly to the patient.

RECOMMENDATION:
Screening mammogram in one year. (Code:51-O-LD2)

BI-RADS CATEGORY  1: Negative.

## 2022-04-29 ENCOUNTER — Other Ambulatory Visit: Payer: Self-pay | Admitting: Internal Medicine

## 2022-05-01 ENCOUNTER — Ambulatory Visit (INDEPENDENT_AMBULATORY_CARE_PROVIDER_SITE_OTHER): Payer: BC Managed Care – PPO | Admitting: Podiatry

## 2022-05-01 DIAGNOSIS — M778 Other enthesopathies, not elsewhere classified: Secondary | ICD-10-CM

## 2022-05-01 DIAGNOSIS — Q666 Other congenital valgus deformities of feet: Secondary | ICD-10-CM | POA: Diagnosis not present

## 2022-05-01 NOTE — Progress Notes (Signed)
Subjective:  Patient ID: Erin Fox, female    DOB: 10/03/1960,  MRN: [id number]  Chief Complaint  Patient presents with   Foot Pain    62 y.o. female presents with the above complaint. Patient presents with complaint of bilateral midfoot pain.  Patient states this came out of nowhere right foot is worse than the left foot.  She went to get it evaluated she has not seen anyone else prior to seeing me.  She is also would like to get a new pair of orthotics as the previous ones has not worn out.  She denies any other acute complaints.  Pain scale 7 out of 10 dull achy in nature   Review of Systems: Negative except as noted in the HPI. Denies N/V/F/Ch.  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 Swallow whole., Disp: , Rfl:    carvedilol (COREG) 12.5 MG tablet, TAKE 1 TABLET BY MOUTH TWICE DAILY WITH A MEAL, Disp: 180 tablet, Rfl: 2   Cholecalciferol (VITAMIN D) 50 MCG (2000 UT) tablet, Take 2,000 Units by mouth daily., Disp: , Rfl:    fluticasone (FLONASE) 50 MCG/ACT nasal spray, Place 2 sprays  into both nostrils daily., Disp: 48 g, Rfl: 3   folic acid (FOLVITE) 1 MG tablet, Take 5 tablets (5 mg total) by mouth daily., Disp: 450 tablet, Rfl: 3   PREVIDENT 5000 BOOSTER PLUS 1.1 % PSTE, Place 1 application. onto teeth daily., Disp: , Rfl:    simvastatin (ZOCOR) 20 MG tablet, Take 1 tablet (20 mg total) by mouth at bedtime., Disp: 90 tablet, Rfl: 1   SYMBICORT 160-4.5 MCG/ACT inhaler, Inhale 2 puffs by mouth twice daily, Disp: 30.6 g, Rfl: 1  Social History   Tobacco Use  Smoking Status Every Day   Packs/day: 0.50   Years: 30.00   Additional pack years: 0.00   Total pack years: 15.00   Types: Cigarettes  Smokeless Tobacco Never  Tobacco Comments   Smoked since age ~32   1/2  ppd    No Known Allergies Objective:  There were no vitals filed for this visit. There is no height or weight on file to calculate BMI. Constitutional Well developed. Well nourished.  Vascular Dorsalis pedis pulses palpable bilaterally. Posterior tibial pulses palpable bilaterally. Capillary refill normal to all digits.  No cyanosis or clubbing noted. Pedal hair growth normal.  Neurologic Normal speech. Oriented to person,  place, and time. Epicritic sensation to light touch grossly present bilaterally.  Dermatologic Nails well groomed and normal in appearance. No open wounds. No skin lesions.  Orthopedic: Pain on palpation to the bilateral midfoot no pain at the Lisfranc interval.  Underlying arthritis clinically appreciated.  No extensor tendinitis noted.  Gait examination shows pes planovalgus with calcaneovalgus to many toe signs partially recruit the arch with dorsiflexion of the hallux able to perform single and double heel raise   Radiographs: None Assessment:   1. Capsulitis of foot, right   2. Capsulitis of foot, left   3. Pes planovalgus    Plan:  Patient was evaluated and treated and all questions answered.  Bilateral midfoot arthritis/capsulitis -All questions and concerns were  discussed with the patient in extensive detail given the amount of pain that she is experiencing she will benefit from steroid injection of decrease inflammatory component associate with pain.  Patient agrees with plan like to proceed with injection -A steroid injection was performed at bilateral midfoot using 1% plain Lidocaine and 10 mg of Kenalog. This was well tolerated.  Pes planovalgus -I explained to patient the etiology of pes planovalgus and relationship with Planter fasciitis and various treatment options were discussed.  Given patient foot structure in the setting of Planter fasciitis I believe patient will benefit from custom-made orthotics to help control the hindfoot motion support the arch of the foot and take the stress away from plantar fascial.  Patient agrees with the plan like to proceed with orthotics -Patient was casted for orthotics

## 2022-05-05 ENCOUNTER — Other Ambulatory Visit: Payer: Self-pay | Admitting: Podiatry

## 2022-05-05 DIAGNOSIS — Q666 Other congenital valgus deformities of feet: Secondary | ICD-10-CM

## 2022-05-07 ENCOUNTER — Encounter: Payer: Self-pay | Admitting: Podiatry

## 2022-05-08 ENCOUNTER — Telehealth: Payer: Self-pay

## 2022-05-08 NOTE — Telephone Encounter (Signed)
PA initiated via Covermymeds; KEY: [id number]  This request has been approved using information available on the patient's profile. [id number];Review Type:Prior Auth;Coverage Start Date:04/08/2022;Coverage End Date:05/08/2023;

## 2022-05-14 MED ORDER — SYMBICORT 160-4.5 MCG/ACT IN AERO: 2.0000 | INHALATION_SPRAY | Freq: Two times a day (BID) | RESPIRATORY_TRACT | 1 refills | Status: DC

## 2022-05-14 NOTE — Telephone Encounter (Signed)
Rx sent 

## 2022-05-14 NOTE — Telephone Encounter (Signed)
Pt would like to pick this rx up.   [place], [place]. [address] [place] [address] Phone: [phone]  Fax: [phone]

## 2022-05-14 NOTE — Addendum Note (Signed)
Addended byDamita Dunnings D on: 05/14/2022 12:44 PM   Modules accepted: Orders

## 2022-05-28 DIAGNOSIS — L538 Other specified erythematous conditions: Secondary | ICD-10-CM | POA: Diagnosis not present

## 2022-05-28 DIAGNOSIS — L821 Other seborrheic keratosis: Secondary | ICD-10-CM | POA: Diagnosis not present

## 2022-05-28 DIAGNOSIS — L298 Other pruritus: Secondary | ICD-10-CM | POA: Diagnosis not present

## 2022-05-28 DIAGNOSIS — L820 Inflamed seborrheic keratosis: Secondary | ICD-10-CM | POA: Diagnosis not present

## 2022-06-05 ENCOUNTER — Ambulatory Visit (INDEPENDENT_AMBULATORY_CARE_PROVIDER_SITE_OTHER): Payer: BC Managed Care – PPO | Admitting: Podiatry

## 2022-06-05 DIAGNOSIS — Q666 Other congenital valgus deformities of feet: Secondary | ICD-10-CM | POA: Diagnosis not present

## 2022-06-05 DIAGNOSIS — M778 Other enthesopathies, not elsewhere classified: Secondary | ICD-10-CM | POA: Diagnosis not present

## 2022-06-05 NOTE — Progress Notes (Signed)
Subjective:  Patient ID: Erin Fox, female    DOB: 02-21-1960,  MRN: [id number]  Chief Complaint  Patient presents with   Foot Pain    Pt stated that she is doing a little better     62 y.o. female presents with the above complaint.  Patient presents for follow-up of bilateral midfoot pain.  Patient states that he is doing a lot better.  She states the injection helped considerably.  She would like to do another bilateral foot injections.   Review of Systems: Negative except as noted in the HPI. Denies N/V/F/Ch.  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 Swallow whole., Disp: , Rfl:    carvedilol (COREG) 12.5 MG tablet, TAKE 1 TABLET BY MOUTH TWICE DAILY WITH A MEAL, Disp: 180 tablet, Rfl: 2   Cholecalciferol (VITAMIN D) 50 MCG (2000 UT) tablet, Take 2,000 Units by mouth daily., Disp: , Rfl:    fluticasone (FLONASE) 50 MCG/ACT nasal spray, Place 2 sprays into both nostrils daily., Disp: 48 g, Rfl: 3   folic acid (FOLVITE) 1 MG tablet, Take 5 tablets (5 mg total) by mouth daily.,  Disp: 450 tablet, Rfl: 3   PREVIDENT 5000 BOOSTER PLUS 1.1 % PSTE, Place 1 application. onto teeth daily., Disp: , Rfl:    simvastatin (ZOCOR) 20 MG tablet, Take 1 tablet (20 mg total) by mouth at bedtime., Disp: 90 tablet, Rfl: 1   SYMBICORT 160-4.5 MCG/ACT inhaler, Inhale 2 puffs into the lungs 2 (two) times daily., Disp: 30.6 g, Rfl: 1  Social History   Tobacco Use  Smoking Status Every Day   Packs/day: 0.50   Years: 30.00   Additional pack years: 0.00   Total pack years: 15.00   Types: Cigarettes  Smokeless Tobacco Never  Tobacco Comments   Smoked since age ~58   1/2  ppd    No Known Allergies Objective:  There were no vitals filed for this visit. There is no height or weight on file to calculate BMI. Constitutional Well developed. Well nourished.  Vascular Dorsalis pedis pulses palpable bilaterally. Posterior tibial pulses palpable bilaterally. Capillary refill normal to all digits.  No cyanosis or clubbing noted. Pedal hair growth normal.  Neurologic Normal speech. Oriented to person, place, and time. Epicritic sensation to light touch grossly present bilaterally.  Dermatologic Nails well groomed and normal in appearance. No open wounds. No skin lesions.  Orthopedic: Pain on palpation to the bilateral midfoot no pain at the Lisfranc interval.  Underlying arthritis clinically appreciated.  No extensor tendinitis noted.  Gait examination shows pes planovalgus with calcaneovalgus to many toe signs partially recruit the arch with dorsiflexion of the hallux able to perform single and double heel raise   Radiographs: None Assessment:   1. Capsulitis of foot, right   2. Capsulitis of foot, left     Plan:  Patient was evaluated and treated and all questions answered.  Bilateral midfoot arthritis/capsulitis -All questions and concerns were discussed with the patient in extensive detail given the amount of pain that she is experiencing she will benefit from steroid  injection of decrease inflammatory component associate with pain.  Patient agrees with plan like to proceed with injection -Another steroid injection was performed at bilateral midfoot using 1% plain Lidocaine and 10 mg of Kenalog. This was well tolerated.  Pes planovalgus -I explained to patient the etiology of pes planovalgus and relationship with Planter fasciitis and various treatment options were discussed.  Given patient foot structure in the setting of Planter fasciitis I believe patient will benefit from custom-made orthotics to help control the hindfoot motion support the arch of the foot and take the stress away from plantar fascial.  Patient agrees with the plan like to proceed with orthotics -Orthotics were dispensed and they are fitting and functioning well.

## 2022-06-14 ENCOUNTER — Other Ambulatory Visit: Payer: Self-pay | Admitting: Internal Medicine

## 2022-06-24 IMAGING — CT CT RENAL STONE PROTOCOL
2 of 4 series · 17 of 46 positions shown, 19 images · non-contrast
Comparison: February 09, 2014.

CLINICAL DATA: Right flank pain, gross hematuria.

EXAM:
CT ABDOMEN AND PELVIS WITHOUT CONTRAST
TECHNIQUE: Multidetector CT imaging of the abdomen and pelvis was performed
following the standard protocol without IV contrast.

[Series 2: axial st · axial · 0.92mm/px · z∈[-412,-37]mm · 14 of 83 slices shown, 16 images]
[im 4/83  soft-tissue]
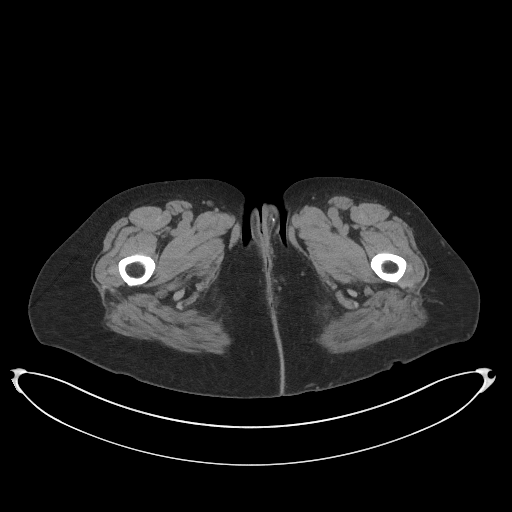
[im 4/83  bone]
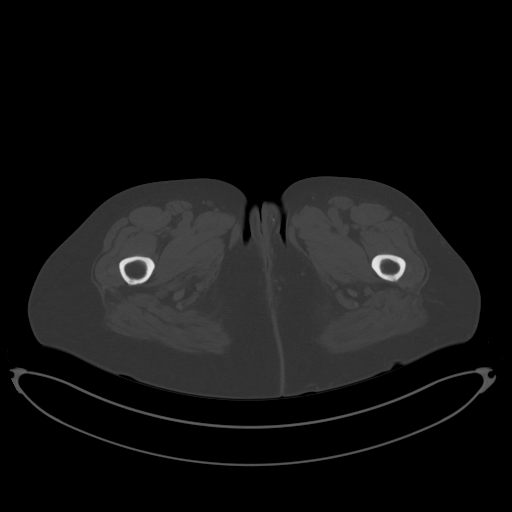
[im 11/83  soft-tissue]
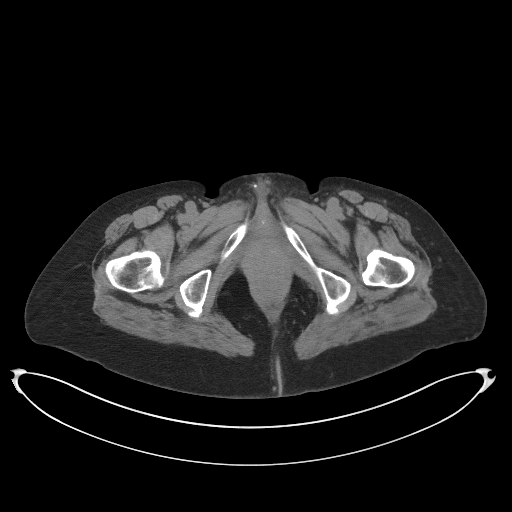
[im 18/83  soft-tissue]
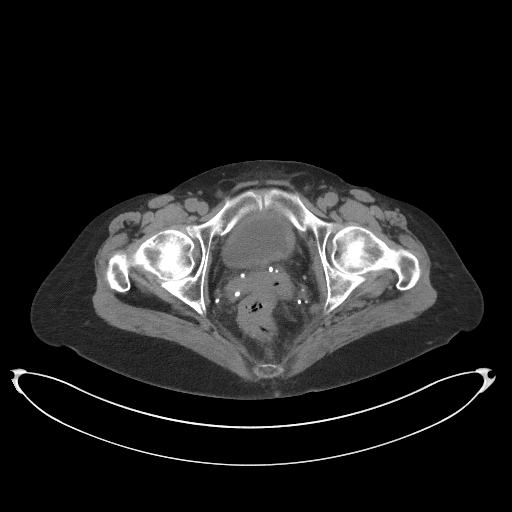
[im 21/83  soft-tissue]
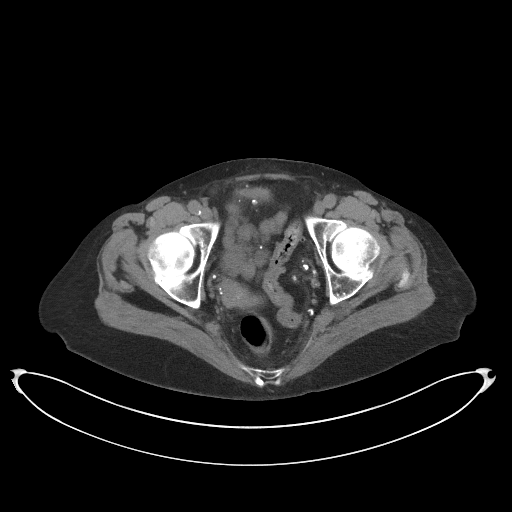
[im 28/83  soft-tissue]
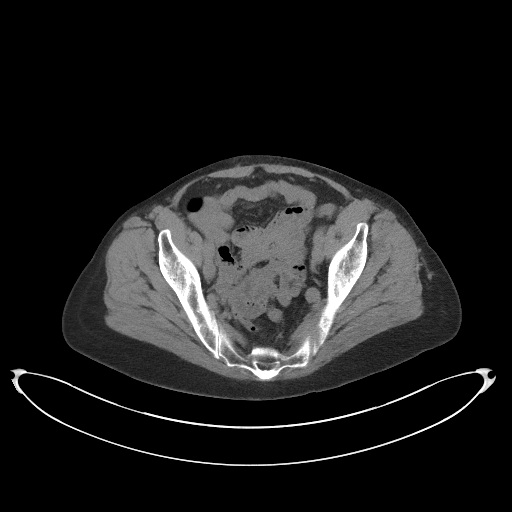
[im 35/83  soft-tissue]
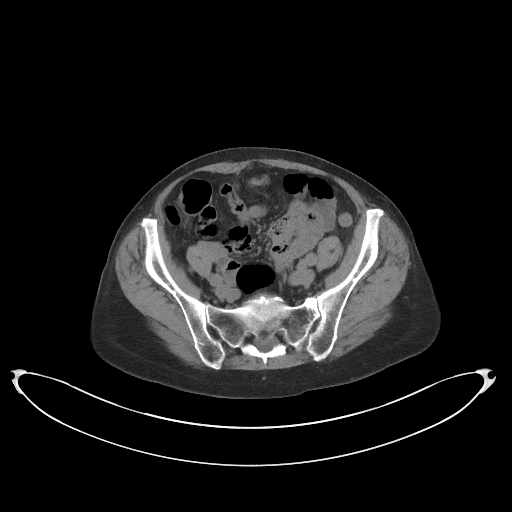
[im 38/83  soft-tissue]
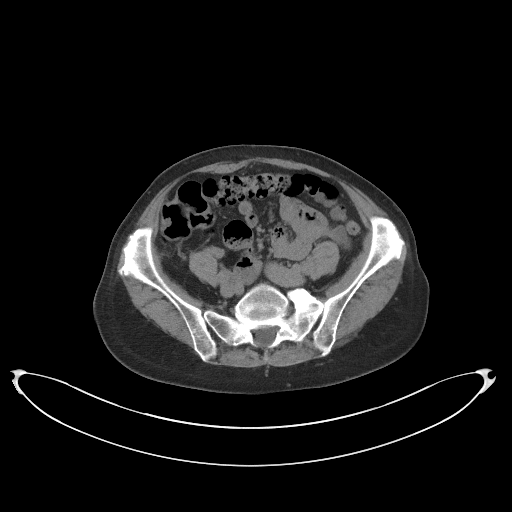
[im 45/83  soft-tissue]
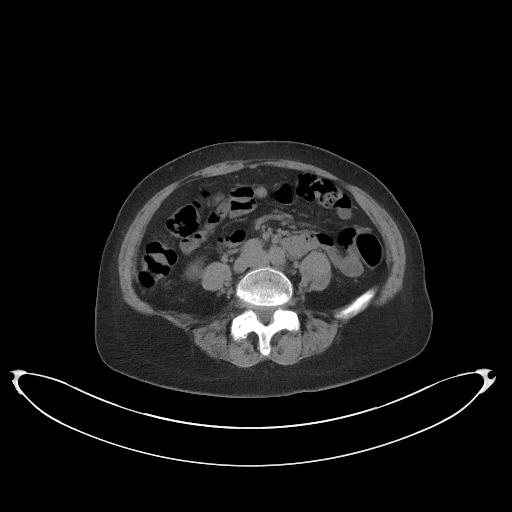
[im 48/83  soft-tissue]
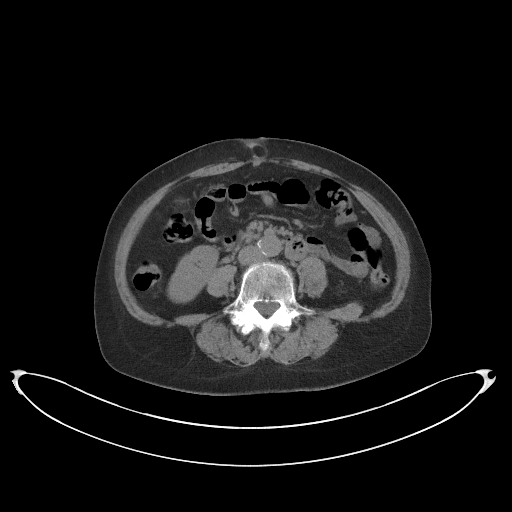
[im 48/83  bone]
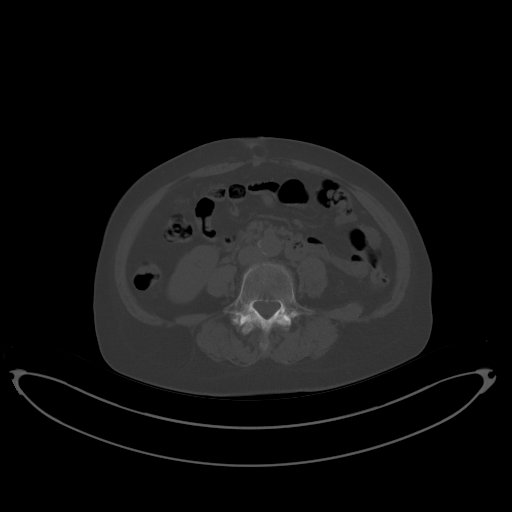
[im 55/83  soft-tissue]
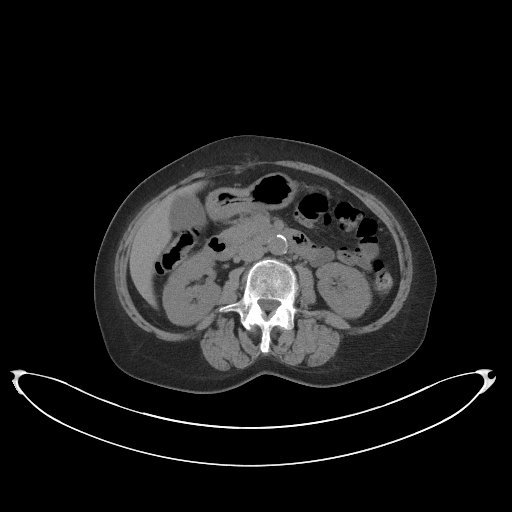
[im 62/83  soft-tissue]
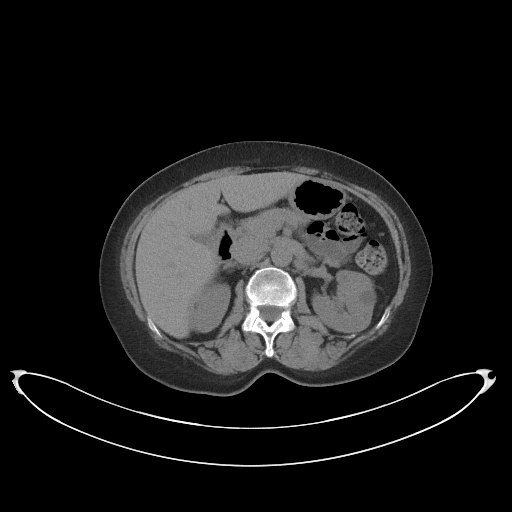
[im 65/83  soft-tissue]
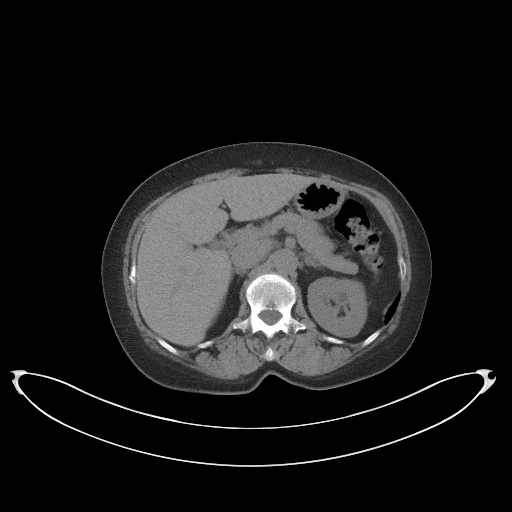
[im 72/83  soft-tissue]
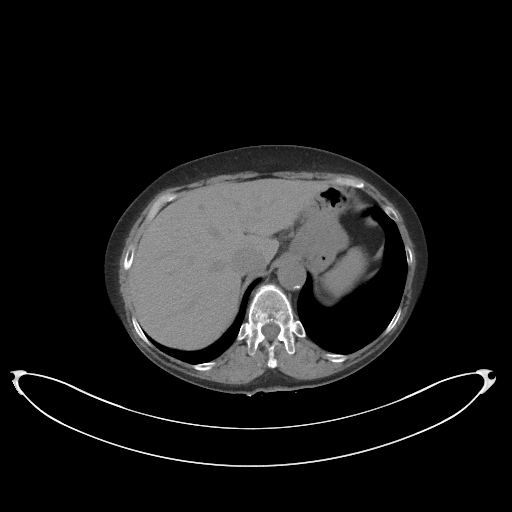
[im 79/83  soft-tissue]
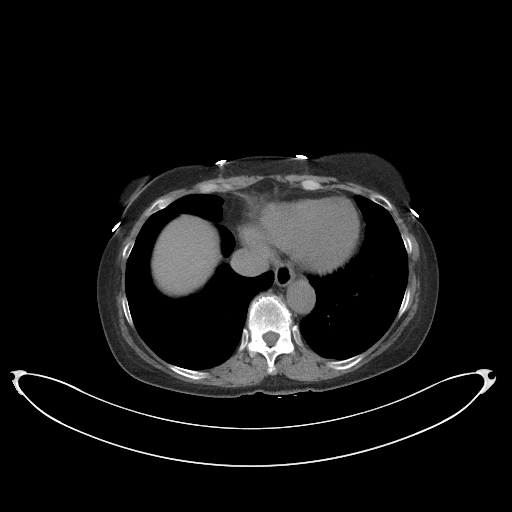

[Series 5: coronal st · coronal · 0.77mm/px · 3 of 83 slices shown]
[im 28/83  soft-tissue]
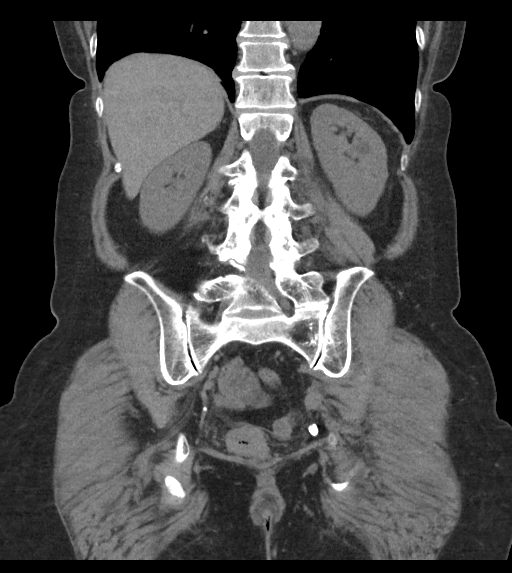
[im 37/83  soft-tissue]
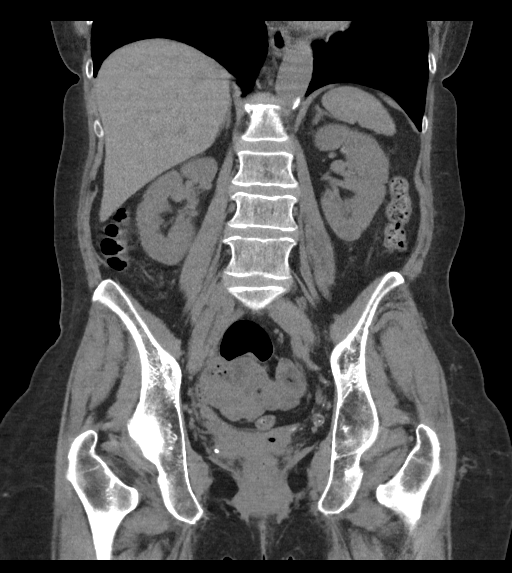
[im 46/83  soft-tissue]
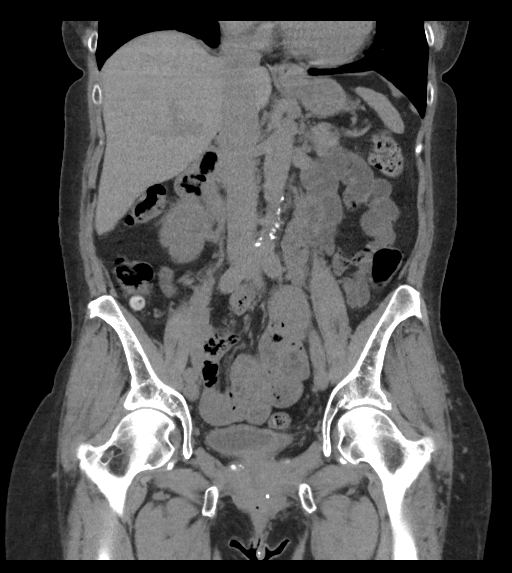

[17 of 46 positions shown; findings below may reference images not displayed]

FINDINGS: Lower chest: No acute abnormality.

Hepatobiliary: No focal liver abnormality is seen. No gallstones,
gallbladder wall thickening, or biliary dilatation.

Pancreas: Unremarkable. No pancreatic ductal dilatation or
surrounding inflammatory changes.

Spleen: Normal in size without focal abnormality.

Adrenals/Urinary Tract: Adrenal glands appear normal. Left renal
cysts are noted. No hydronephrosis or renal obstruction is noted.
Urinary bladder is unremarkable.

Stomach/Bowel: Stomach is within normal limits. Appendix appears
normal. No evidence of bowel wall thickening, distention, or
inflammatory changes.

Vascular/Lymphatic: Aortic atherosclerosis. No enlarged abdominal or
pelvic lymph nodes.

Reproductive: Status post hysterectomy. No adnexal masses.

Other: No abdominal wall hernia or abnormality. No abdominopelvic
ascites.

Musculoskeletal: No acute or significant osseous findings.
IMPRESSION: No acute abnormality seen in the abdomen or pelvis.

Aortic Atherosclerosis ([id number]).

## 2022-06-25 ENCOUNTER — Ambulatory Visit: Payer: BC Managed Care – PPO | Admitting: Dermatology

## 2022-07-08 ENCOUNTER — Telehealth: Payer: Self-pay | Admitting: Acute Care

## 2022-07-08 NOTE — Telephone Encounter (Signed)
Patient was referred to the Lung Cancer Screening program by Dr. Drue Novel. Called and spoke to pt. Reviewed the program details and screening questions with patient. Per insurance guidelines the patient must have at least a 20 pack year smoking history. Pt states she started smoking at age 62 and is still a current smoker; pt quit 2 years within this timeframe. Patient states she smokes 0.5 packs per day and feels this is a good average. Patient would have 33 years smoking with 0.5ppd, subtracting the 2 years she quit, leaving patient with a 16.5 pack year history. Patient is aware she is ineligible for the program and this information will be forwarded to her PCP as an FYI.

## 2022-07-09 NOTE — Telephone Encounter (Signed)
Noted, thank you

## 2022-08-11 ENCOUNTER — Other Ambulatory Visit: Payer: Self-pay | Admitting: Internal Medicine

## 2022-08-11 DIAGNOSIS — Z1231 Encounter for screening mammogram for malignant neoplasm of breast: Secondary | ICD-10-CM

## 2022-08-25 ENCOUNTER — Telehealth: Payer: Self-pay | Admitting: Internal Medicine

## 2022-08-25 ENCOUNTER — Encounter: Payer: Self-pay | Admitting: Internal Medicine

## 2022-08-25 DIAGNOSIS — Z01419 Encounter for gynecological examination (general) (routine) without abnormal findings: Secondary | ICD-10-CM | POA: Diagnosis not present

## 2022-08-25 NOTE — Telephone Encounter (Signed)
LMOM informing of PCP recommendations.  

## 2022-08-25 NOTE — Telephone Encounter (Signed)
Pt has sent pics via mychart.

## 2022-08-25 NOTE — Telephone Encounter (Signed)
Office visit for blood work?

## 2022-08-25 NOTE — Telephone Encounter (Signed)
Aspirin started 03/2022 due to cardiovascular risk of 8.5% at 10 years. I saw the pictures, she has ecchymosis at the arms.  Not sure if that is caused by aspirin but certainly it could make her prone to such problems. Plan: Hold aspirin for 2 to 3 weeks, then restart, see if there is any relationship. If she has bleeding in the stools or in the urine: Needs to be seen.

## 2022-08-25 NOTE — Telephone Encounter (Signed)
Pt called & said when she visited her gynecologist they noticed bruises going from her elbow up, on both sides & they think the Asprin she was prescribed is causing it. She wants to know if she should continue to take it. She will send pictures on mychart. Please advise pt.

## 2022-08-28 DIAGNOSIS — U071 COVID-19: Secondary | ICD-10-CM | POA: Diagnosis not present

## 2022-08-28 DIAGNOSIS — R059 Cough, unspecified: Secondary | ICD-10-CM | POA: Diagnosis not present

## 2022-09-03 ENCOUNTER — Other Ambulatory Visit: Payer: Self-pay

## 2022-09-03 ENCOUNTER — Other Ambulatory Visit: Payer: Self-pay | Admitting: Internal Medicine

## 2022-09-03 MED ORDER — BUDESONIDE-FORMOTEROL FUMARATE 160-4.5 MCG/ACT IN AERO: 2.0000 | INHALATION_SPRAY | Freq: Two times a day (BID) | RESPIRATORY_TRACT | 5 refills | Status: DC

## 2022-09-10 ENCOUNTER — Encounter (INDEPENDENT_AMBULATORY_CARE_PROVIDER_SITE_OTHER): Payer: Self-pay

## 2022-09-16 ENCOUNTER — Ambulatory Visit: Admission: RE | Admit: 2022-09-16 | Payer: BC Managed Care – PPO | Source: Ambulatory Visit

## 2022-09-16 DIAGNOSIS — Z1231 Encounter for screening mammogram for malignant neoplasm of breast: Secondary | ICD-10-CM | POA: Diagnosis not present

## 2022-09-29 ENCOUNTER — Ambulatory Visit (INDEPENDENT_AMBULATORY_CARE_PROVIDER_SITE_OTHER): Payer: BC Managed Care – PPO | Admitting: Internal Medicine

## 2022-09-29 ENCOUNTER — Encounter: Payer: Self-pay | Admitting: Internal Medicine

## 2022-09-29 ENCOUNTER — Telehealth: Payer: Self-pay | Admitting: Gastroenterology

## 2022-09-29 VITALS — BP 120/68 | HR 50 | Temp 98.1°F | Resp 16 | Ht 67.0 in | Wt 159.0 lb

## 2022-09-29 DIAGNOSIS — I10 Essential (primary) hypertension: Secondary | ICD-10-CM | POA: Diagnosis not present

## 2022-09-29 DIAGNOSIS — E538 Deficiency of other specified B group vitamins: Secondary | ICD-10-CM | POA: Diagnosis not present

## 2022-09-29 DIAGNOSIS — J4520 Mild intermittent asthma, uncomplicated: Secondary | ICD-10-CM

## 2022-09-29 DIAGNOSIS — E785 Hyperlipidemia, unspecified: Secondary | ICD-10-CM | POA: Diagnosis not present

## 2022-09-29 MED ORDER — ASPIRIN 81 MG PO TBEC: 81.0000 mg | DELAYED_RELEASE_TABLET | ORAL | Status: DC

## 2022-09-29 NOTE — Assessment & Plan Note (Signed)
HTN: BP looks good, continue amlodipine, carvedilol, monitor BPs at home High cholesterol: On simvastatin, last LDL satisfactory.  See next Cardiovascular risk: 8.5% Risk mostly related to tobacco.  Quitting d/w pt Also  was recommended to start aspirin but d/c d/t easy bruising. Benefits of aspirin again discussed, she will try to take 1 tablet every other day. Folic acid and B12 deficiencies: Labs from 03-2022 showed severely decreased B12.  Was Rx supplements but did not take them, encouraged to do. Vaccine advice: Flu shot every fall, RSV, COVID booster if not done recently Due for a colonoscopy, having a hard time finding somebody to bring her back home after a cscope  Reactive airway disease: Fortunately doing well, uses albuterol rarely RTC 6 months CPX

## 2022-09-29 NOTE — Progress Notes (Signed)
Subjective:    Patient ID: Erin Fox, female    DOB: 07/05/1960, 62 y.o.   MRN: [id number]  DOS:  09/29/2022 Type of visit - description: rov  Since the last office visit, she is doing okay.  Tried aspirin daily, developed easy bruising, self d/c ASA.  Denies nausea vomiting.  No blood in the stools.  Had her second case of COVID few weeks ago, feels essentially fully recuperated except for mild cough.  Not taking B12 supplements.   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   Wears glasses     Past Surgical History:  Procedure Laterality Date   5th toes resected bilateral  1990's   bone's removed   ABDOMINAL HYSTERECTOMY  1984   W/ UNILATERAL SALPINGOOPHORECTOMY   ABDOMINOPLASTY  1990   BREAST BIOPSY Right 2001   CESAREAN SECTION     COLONOSCOPY     COLONOSCOPY W/ POLYPECTOMY  last one 02-14-2014   COLONOSCOPY WITH PROPOFOL N/A 05/23/2021   Procedure: COLONOSCOPY WITH PROPOFOL;  Surgeon: Lemar Lofty., MD;  Location: [facility] ENDOSCOPY;  Service: Gastroenterology;  Laterality: N/A;   CYSTO/  RETROGRADE PYELOGRAM/  LEFT URETEROSCOPY/  BLADDER BIOPSY  08/27/2004   CYSTOSCOPY W/ RETROGRADES Bilateral 03/17/2014   Procedure: CYSTOSCOPY WITH RETROGRADE PYELOGRAM;  Surgeon: Sebastian Ache, MD;  Location[facility];  Service: Urology;  Laterality: Bilateral;   CYSTOSCOPY WITH  BIOPSY N/A 03/17/2014   Procedure: CYSTOSCOPY WITH BIOPSY AND FULGERATION;  Surgeon: Sebastian Ache, MD;  Location[facility];  Service: Urology;  Laterality: N/A;   ENDOSCOPIC MUCOSAL RESECTION N/A 05/23/2021   Procedure: ENDOSCOPIC MUCOSAL RESECTION;  Surgeon: Meridee Score Netty Starring., MD;  Location: [facility] ENDOSCOPY;  Service: Gastroenterology;  Laterality: N/A;   HEMOSTASIS CLIP PLACEMENT  05/23/2021   Procedure: HEMOSTASIS CLIP PLACEMENT;  Surgeon: Lemar Lofty., MD;  Location: [facility] ENDOSCOPY;  Service: Gastroenterology;;   OTHER SURGICAL HISTORY Left 2016   Cyst Removal on Left ear   POLYPECTOMY     POLYPECTOMY  05/23/2021   Procedure: POLYPECTOMY;  Surgeon: Mansouraty, Netty Starring., MD;  Location: [facility] ENDOSCOPY;  Service: Gastroenterology;;   REMOVAL BARTHOLIN CYST/ GLAND AND REVISION LEFT LABIA  06/10/2004   SUBMUCOSAL LIFTING INJECTION  05/23/2021   Procedure: SUBMUCOSAL LIFTING INJECTION;  Surgeon: Lemar Lofty., MD;  Location: [facility] ENDOSCOPY;  Service: Gastroenterology;;    Current Outpatient Medications  Medication Instructions   acyclovir (ZOVIRAX) 400 mg, Oral, 2 times daily PRN   albuterol (VENTOLIN HFA) 108 (90 Base) MCG/ACT inhaler 2 puffs, Inhalation, Every 6 hours PRN   amLODipine (NORVASC) 5 mg, Oral, Daily   aspirin EC 81 mg, Oral, Daily, Swallow whole.   budesonide-formoterol (BREYNA) 160-4.5 MCG/ACT inhaler 2 puffs, Inhalation, 2 times daily   carvedilol (COREG) 12.5  mg, Oral, 2 times daily with meals   fluticasone (FLONASE) 50 MCG/ACT nasal spray 2 sprays, Each Nare, Daily   folic acid (FOLVITE) 5 mg, Oral, Daily   PREVIDENT 5000 BOOSTER PLUS 1.1 % PSTE 1 application , dental, Daily   simvastatin (ZOCOR) 20 mg, Oral, Daily at bedtime   Vitamin D 2,000 Units, Oral, Daily       Objective:   Physical Exam BP 120/68   Pulse (!) 50   Temp 98.1 F (36.7 C) (Oral)   Resp 16   Ht 5\' 7"  (1.702 m)   Wt 159 lb (72.1 kg)   SpO2 96%   BMI  24.90 kg/m  General:   Well developed, NAD, BMI noted. HEENT:  Normocephalic . Face symmetric, atraumatic Lungs:  CTA B Normal respiratory effort, no intercostal retractions, no accessory muscle use. Heart: RRR,  no murmur.  Skin: Not pale. Not jaundice Neurologic:  alert & oriented X3.  Speech normal, gait appropriate for age and unassisted Psych--  Cognition and judgment appear intact.  Cooperative with normal attention span and concentration.  Behavior appropriate. No anxious or depressed appearing.      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 Over active bladder, s/p cysto   >1, 2011, 2016, etc, see urology notes  H/o hematuria- s/p extensive w/u ~ 2016 Non-obstructive nephrolithiasis Hemorrhoids- banding x 3 2020 IBS? Sees dermatology regularly Mild folic acid deficiency (labs 01/2020). B12  dx  Def 2024  PLAN HTN: BP looks good, continue amlodipine, carvedilol, monitor BPs at home High cholesterol: On simvastatin, last LDL satisfactory.  See next Cardiovascular risk: 8.5% Risk mostly related to tobacco.  Quitting d/w pt Also  was recommended to start aspirin but d/c d/t easy bruising. Benefits of aspirin again discussed, she will try to take 1 tablet every other day. Folic acid and B12 deficiencies: Labs from 03-2022 showed severely decreased B12.  Was Rx supplements but did not take them, encouraged to do. Vaccine advice: Flu shot every fall, RSV, COVID booster if not done recently Due for a colonoscopy, having a hard time finding somebody to bring her back home after a cscope  Reactive airway disease: Fortunately doing well, uses albuterol rarely RTC 6 months CPX

## 2022-09-29 NOTE — Telephone Encounter (Signed)
Inbound call from patient requesting to schedule recall hospital colonoscopy. Requesting a call back. Please advise, thank you.

## 2022-09-29 NOTE — Patient Instructions (Addendum)
Vaccines I recommend: Covid booster Flu shot this fall RSV vaccine  Aspirin: Take 1 tablet every other day  Vitamin B12 supplements: Take no less than 2000 mcg daily    Check the  blood pressure regularly Blood pressure goal:  between 110/65 and  135/85. If it is consistently higher or lower, let me know     GO TO THE FRONT DESK, PLEASE SCHEDULE YOUR APPOINTMENTS Come back for   a physical exam by 03-2023

## 2022-10-01 ENCOUNTER — Other Ambulatory Visit (HOSPITAL_BASED_OUTPATIENT_CLINIC_OR_DEPARTMENT_OTHER): Payer: Self-pay

## 2022-10-01 MED ORDER — RSVPREF3 VAC RECOMB ADJUVANTED 120 MCG/0.5ML IM SUSR
0.5000 mL | Freq: Once | INTRAMUSCULAR | 0 refills | Status: AC
  Filled 2022-10-01: qty 0.5, 1d supply, fill #0

## 2022-10-02 ENCOUNTER — Other Ambulatory Visit: Payer: Self-pay

## 2022-10-02 DIAGNOSIS — Z86010 Personal history of colonic polyps: Secondary | ICD-10-CM

## 2022-10-02 MED ORDER — NA SULFATE-K SULFATE-MG SULF 17.5-3.13-1.6 GM/177ML PO SOLN: 1.0000 | Freq: Once | ORAL | 0 refills | Status: AC

## 2022-10-02 NOTE — Telephone Encounter (Signed)
Appt made for 12/01/22 at 1030 am at [facility] with GM   Left message on machine to call back

## 2022-10-02 NOTE — Telephone Encounter (Signed)
Dr Meridee Score does this colon need to be at the hospital or can she be done in the [facility]?

## 2022-10-02 NOTE — Telephone Encounter (Signed)
Hospital 60 minute Colon EMR. Thanks. GM

## 2022-10-03 NOTE — Telephone Encounter (Signed)
Noted nothing further needed.

## 2022-10-03 NOTE — Telephone Encounter (Signed)
Inbound call from patient confirming 10/21 procedure date. Requesting a call back in any further information is needing to be given. Please advise, thank you.

## 2022-10-03 NOTE — Telephone Encounter (Signed)
Left message on machine to call back  

## 2022-10-08 DIAGNOSIS — L298 Other pruritus: Secondary | ICD-10-CM | POA: Diagnosis not present

## 2022-10-08 DIAGNOSIS — L820 Inflamed seborrheic keratosis: Secondary | ICD-10-CM | POA: Diagnosis not present

## 2022-10-08 DIAGNOSIS — L821 Other seborrheic keratosis: Secondary | ICD-10-CM | POA: Diagnosis not present

## 2022-10-08 DIAGNOSIS — L538 Other specified erythematous conditions: Secondary | ICD-10-CM | POA: Diagnosis not present

## 2022-10-20 ENCOUNTER — Other Ambulatory Visit: Payer: Self-pay | Admitting: Internal Medicine

## 2022-10-22 ENCOUNTER — Telehealth: Payer: Self-pay | Admitting: Internal Medicine

## 2022-10-22 NOTE — Telephone Encounter (Signed)
Please advise 

## 2022-10-22 NOTE — Telephone Encounter (Signed)
Spoke w/ Pt- informed of PCP recommendations. Pt verbalized understanding.  

## 2022-10-22 NOTE — Telephone Encounter (Signed)
Vax I recommend:  COVID booster Flu shot. RSV. Last Tdap was 2016 next 2026. PNM 20 when she is 38

## 2022-10-22 NOTE — Telephone Encounter (Signed)
Patient needs tetanus shot. Please place order. Patient would like to get her pneumonia shot while she is here. Please advise when ready to schedule.

## 2022-11-15 ENCOUNTER — Other Ambulatory Visit: Payer: Self-pay | Admitting: Internal Medicine

## 2022-11-17 ENCOUNTER — Telehealth: Payer: Self-pay | Admitting: Gastroenterology

## 2022-11-17 NOTE — Telephone Encounter (Signed)
Inbound call from patient requesting a call to reschedule 10/21 colonoscopy at [facility] for November. Please advise, thank you.

## 2022-11-18 NOTE — Telephone Encounter (Signed)
PT  returning call to reschedule colonoscopy. She wants to have it done in either November or December. Please advise.

## 2022-11-18 NOTE — Telephone Encounter (Signed)
Appt has been rescheduled to 01/26/23 at 11 am at [facility] with GM   Left message on machine to call back

## 2022-11-19 NOTE — Telephone Encounter (Signed)
Patient returned call.   Confirmed the procedure date.

## 2022-11-19 NOTE — Telephone Encounter (Signed)
Left message on machine to call back  

## 2022-11-19 NOTE — Telephone Encounter (Signed)
Noted the pt should call if she has any further concerns or questions.

## 2022-12-11 ENCOUNTER — Other Ambulatory Visit: Payer: Self-pay | Admitting: Internal Medicine

## 2022-12-25 DIAGNOSIS — S161XXA Strain of muscle, fascia and tendon at neck level, initial encounter: Secondary | ICD-10-CM | POA: Diagnosis not present

## 2023-01-14 DIAGNOSIS — I83892 Varicose veins of left lower extremities with other complications: Secondary | ICD-10-CM | POA: Diagnosis not present

## 2023-01-15 DIAGNOSIS — I83892 Varicose veins of left lower extremities with other complications: Secondary | ICD-10-CM | POA: Diagnosis not present

## 2023-01-15 DIAGNOSIS — Z09 Encounter for follow-up examination after completed treatment for conditions other than malignant neoplasm: Secondary | ICD-10-CM | POA: Diagnosis not present

## 2023-01-20 ENCOUNTER — Other Ambulatory Visit: Payer: Self-pay | Admitting: Internal Medicine

## 2023-01-20 ENCOUNTER — Encounter (HOSPITAL_COMMUNITY): Payer: Self-pay | Admitting: Gastroenterology

## 2023-01-26 ENCOUNTER — Encounter (HOSPITAL_COMMUNITY)
Admission: RE | Disposition: A | Discharge status: Home / Self Care | Payer: Self-pay | Source: Home / Self Care | Attending: Gastroenterology

## 2023-01-26 ENCOUNTER — Ambulatory Visit (HOSPITAL_COMMUNITY): Payer: BC Managed Care – PPO

## 2023-01-26 ENCOUNTER — Other Ambulatory Visit: Payer: Self-pay

## 2023-01-26 ENCOUNTER — Encounter (HOSPITAL_COMMUNITY): Payer: Self-pay | Admitting: Gastroenterology

## 2023-01-26 ENCOUNTER — Ambulatory Visit (HOSPITAL_COMMUNITY)
Admission: RE | Admit: 2023-01-26 | Discharge: 2023-01-26 | Disposition: A | Discharge status: Home / Self Care | Payer: BC Managed Care – PPO | Attending: Gastroenterology | Admitting: Gastroenterology

## 2023-01-26 DIAGNOSIS — K5730 Diverticulosis of large intestine without perforation or abscess without bleeding: Secondary | ICD-10-CM | POA: Diagnosis not present

## 2023-01-26 DIAGNOSIS — D123 Benign neoplasm of transverse colon: Secondary | ICD-10-CM | POA: Insufficient documentation

## 2023-01-26 DIAGNOSIS — D122 Benign neoplasm of ascending colon: Secondary | ICD-10-CM | POA: Diagnosis not present

## 2023-01-26 DIAGNOSIS — K635 Polyp of colon: Secondary | ICD-10-CM | POA: Diagnosis not present

## 2023-01-26 DIAGNOSIS — K641 Second degree hemorrhoids: Secondary | ICD-10-CM | POA: Diagnosis not present

## 2023-01-26 DIAGNOSIS — I10 Essential (primary) hypertension: Secondary | ICD-10-CM | POA: Diagnosis not present

## 2023-01-26 DIAGNOSIS — Z9889 Other specified postprocedural states: Secondary | ICD-10-CM | POA: Diagnosis not present

## 2023-01-26 DIAGNOSIS — K644 Residual hemorrhoidal skin tags: Secondary | ICD-10-CM | POA: Insufficient documentation

## 2023-01-26 DIAGNOSIS — Z79899 Other long term (current) drug therapy: Secondary | ICD-10-CM | POA: Insufficient documentation

## 2023-01-26 DIAGNOSIS — D124 Benign neoplasm of descending colon: Secondary | ICD-10-CM

## 2023-01-26 DIAGNOSIS — F17210 Nicotine dependence, cigarettes, uncomplicated: Secondary | ICD-10-CM | POA: Diagnosis not present

## 2023-01-26 DIAGNOSIS — Q438 Other specified congenital malformations of intestine: Secondary | ICD-10-CM | POA: Insufficient documentation

## 2023-01-26 DIAGNOSIS — Q439 Congenital malformation of intestine, unspecified: Secondary | ICD-10-CM

## 2023-01-26 DIAGNOSIS — Z860101 Personal history of adenomatous and serrated colon polyps: Secondary | ICD-10-CM

## 2023-01-26 DIAGNOSIS — Z1211 Encounter for screening for malignant neoplasm of colon: Secondary | ICD-10-CM | POA: Diagnosis not present

## 2023-01-26 DIAGNOSIS — Z09 Encounter for follow-up examination after completed treatment for conditions other than malignant neoplasm: Secondary | ICD-10-CM | POA: Diagnosis not present

## 2023-01-26 DIAGNOSIS — D126 Benign neoplasm of colon, unspecified: Secondary | ICD-10-CM

## 2023-01-26 HISTORY — PX: SUBMUCOSAL LIFTING INJECTION: SHX6855

## 2023-01-26 HISTORY — PX: HEMOSTASIS CONTROL: SHX6838

## 2023-01-26 HISTORY — PX: BIOPSY: SHX5522

## 2023-01-26 HISTORY — PX: COLONOSCOPY WITH PROPOFOL: SHX5780

## 2023-01-26 HISTORY — PX: POLYPECTOMY: SHX5525

## 2023-01-26 HISTORY — PX: SUBMUCOSAL TATTOO INJECTION: SHX6856

## 2023-01-26 HISTORY — PX: ENDOSCOPIC MUCOSAL RESECTION: SHX6839

## 2023-01-26 SURGERY — COLONOSCOPY WITH PROPOFOL
Anesthesia: Monitor Anesthesia Care

## 2023-01-26 MED ORDER — PROPOFOL 500 MG/50ML IV EMUL
INTRAVENOUS | Status: AC
  Filled 2023-01-26: qty 50

## 2023-01-26 MED ORDER — PROPOFOL 1000 MG/100ML IV EMUL
INTRAVENOUS | Status: AC
  Filled 2023-01-26: qty 100

## 2023-01-26 MED ORDER — SPOT INK MARKER SYRINGE KIT
PACK | SUBMUCOSAL | Status: AC
  Filled 2023-01-26: qty 5

## 2023-01-26 MED ORDER — SODIUM CHLORIDE 0.9 % IV SOLN: INTRAVENOUS | Status: DC | PRN

## 2023-01-26 MED ORDER — SPOT INK MARKER SYRINGE KIT
PACK | SUBMUCOSAL | Status: DC | PRN
  Administered 2023-01-26: 2.5 mL via SUBMUCOSAL

## 2023-01-26 MED ORDER — PROPOFOL 10 MG/ML IV BOLUS
INTRAVENOUS | Status: DC | PRN
  Administered 2023-01-26: 20 mg via INTRAVENOUS
  Administered 2023-01-26 (×2): 30 mg via INTRAVENOUS
  Administered 2023-01-26: 40 mg via INTRAVENOUS
  Administered 2023-01-26: 125 ug/kg/min via INTRAVENOUS
  Administered 2023-01-26: 50 mg via INTRAVENOUS
  Administered 2023-01-26: 30 mg via INTRAVENOUS
  Administered 2023-01-26: 50 mg via INTRAVENOUS
  Administered 2023-01-26: 20 mg via INTRAVENOUS

## 2023-01-26 SURGICAL SUPPLY — 21 items

## 2023-01-26 NOTE — Anesthesia Procedure Notes (Signed)
Procedure Name: MAC Date/Time: 01/26/2023 11:27 AM  Performed by: Sharyn Dross, CRNAPre-anesthesia Checklist: Patient identified, Emergency Drugs available, Suction available, Patient being monitored and Timeout performed Patient Re-evaluated:Patient Re-evaluated prior to induction Oxygen Delivery Method: Nasal cannula Preoxygenation: Pre-oxygenation with 100% oxygen Induction Type: IV induction Placement Confirmation: positive ETCO2 Dental Injury: Teeth and Oropharynx as per pre-operative assessment

## 2023-01-26 NOTE — Progress Notes (Signed)
[employer]

## 2023-01-26 NOTE — Op Note (Signed)
[facility] Patient Name: Erin Fox Procedure Date: 01/26/2023 MRN: [id number] Attending MD: Corliss Parish , MD, [id number] Date of Birth: 06-Feb-1961 CSN: [id number] Age: 62 Admit Type: Outpatient Procedure:                Colonoscopy Indications:              High risk colon cancer surveillance: Personal                            history of sessile serrated colon polyp (10 mm or                            greater in size) -previous piecemeal resection in                            2023 (overdue for surveillance) Providers:                Corliss Parish, MD, Fransisca Connors, Kandice Robinsons, Technician, Harrington Challenger, Technician, Eliberto Ivory, RN Referring MD:              Medicines:                Monitored Anesthesia Care Complications:            No immediate complications. Estimated Blood Loss:     Estimated blood loss was minimal. Procedure:                Pre-Anesthesia Assessment:                           - Prior to the procedure, a History and Physical                            was performed, and patient medications and                            allergies were reviewed. The patient's tolerance of                            previous anesthesia was also reviewed. The risks                            and benefits of the procedure and the sedation                            options and risks were discussed with the patient.                            All questions were answered, and informed consent                            was  obtained. Prior Anticoagulants: The patient has                            taken no anticoagulant or antiplatelet agents                            except for aspirin. ASA Grade Assessment: II - A                            patient with mild systemic disease. After reviewing                            the risks and benefits, the patient was deemed in                             satisfactory condition to undergo the procedure.                           After obtaining informed consent, the colonoscope                            was passed under direct vision. Throughout the                            procedure, the patient's blood pressure, pulse, and                            oxygen saturations were monitored continuously. The                            CF-HQ190L ([id number]) Olympus colonoscope was                            introduced through the anus and advanced to the the                            sigmoid colon for evaluation. This was the intended                            extent. The PCF-PH190L ([id number]) Olympus ultra                            slim endoscope was introduced through the anus and                            advanced to the the cecum, identified by                            appendiceal orifice and ileocecal valve. The                            colonoscopy was technically difficult and complex  due to restricted mobility of the colon,                            significant looping and a tortuous colon.                            Successful completion of the procedure was aided by                            changing the patient to a supine position, using                            manual pressure, straightening and shortening the                            scope to obtain bowel loop reduction and using                            scope torsion. The patient tolerated the procedure.                            The quality of the bowel preparation was adequate.                            The ileocecal valve, appendiceal orifice, and                            rectum were photographed. Scope In: 11:43:29 AM Scope Out: 12:42:14 PM Scope Withdrawal Time: 0 hours 44 minutes 58 seconds  Total Procedure Duration: 0 hours 58 minutes 45 seconds  Findings:      The digital rectal exam findings include hemorrhoids.  Pertinent       negatives include no palpable rectal lesions.      The left colon was grossly tortuous.      A medium post mucosectomy scar was found in the ascending colon. There       was a small 4 mm area of potential recurrent polypoid tissue versus       granulation tissue. This was removed for sampling purposes with a cold       snare for histology.      A 30 mm polyp was found in the transverse colon. The polyp was sessile.       Preparations were made for attempt at mucosal resection. Demarcation of       the lesion was performed with high-definition white light and narrow       band imaging to clearly identify the boundaries of the lesion. EverLift       was injected to raise the lesion. Due to the patient's positioning, I       could not have any sort of one-to-one movement of my scope torque       (actually had reverse torque occurring throughout the procedure, which       required me to move the patient into different positions along the way       in an effort of trying to optimize resection attempt) piecemeal mucosal       resection using a  snare was performed. Resection and retrieval were       complete. Resected tissue margins were examined and clear of polyp       tissue. Due to the inadequacy of the overall positioning for resection,       clip closure was felt to be difficult to attempt, so the area was       successfully injected with 3 mL PuraStat for hemostasis. Area slightly       distal and on contralateral wall was tattooed with an injection of Spot       (carbon black).      A medium post mucosectomy scar was found in the transverse colon. There       was no evidence of the previous polyp.      A 5 mm polyp was found in the descending colon. The polyp was sessile.       The polyp was removed with a cold snare. Resection and retrieval were       complete.      Multiple small-mouthed diverticula were found in the recto-sigmoid       colon, sigmoid colon and  ascending colon.      Normal mucosa was found in the entire colon otherwise.      Non-bleeding non-thrombosed external and internal hemorrhoids were found       during retroflexion, during perianal exam and during digital exam. The       hemorrhoids were Grade II (internal hemorrhoids that prolapse but reduce       spontaneously). Impression:               - Hemorrhoids found on digital rectal exam.                           - Grossly tortuous left colon.                           - Post mucosectomy scar in the ascending colon with                            small area of possible recurrence (tissue removed).                           - One 30 mm polyp in the transverse colon, removed                            with piecemeal mucosal resection. Difficult for                            resection as a result of positioning as documented                            above. Resected and retrieved. Injected PuraStat.                            Tattoo placed on contralateral wall.                           - Post mucosectomy scar in the transverse colon  noted.                           - One 5 mm polyp in the descending colon, removed                            with a cold snare. Resected and retrieved.                           - Diverticulosis in the recto-sigmoid colon, in the                            sigmoid colon and in the ascending colon.                           - Normal mucosa in the entire examined colon                            otherwise.                           - Non-bleeding non-thrombosed external and internal                            hemorrhoids. Moderate Sedation:      Not Applicable - Patient had care per Anesthesia. Recommendation:           - The patient will be observed post-procedure,                            until all discharge criteria are met.                           - Discharge patient to home.                           - Patient  has a contact number available for                            emergencies. The signs and symptoms of potential                            delayed complications were discussed with the                            patient. Return to normal activities tomorrow.                            Written discharge instructions were provided to the                            patient.                           - High fiber diet.                           -  Use FiberCon 1-2 tablets PO daily.                           - Continue present medications.                           - Await pathology results.                           - Repeat colonoscopy in 6 to 12 months for                            surveillance based on pathology results. Use                            ultraslim colonoscope.                           - The findings and recommendations were discussed                            with the patient.                           - The findings and recommendations were discussed                            with the patient's family. Procedure Code(s):        --- Professional ---                           [phone], Colonoscopy, flexible; with endoscopic                            mucosal resection                           [id number], 59, Colonoscopy, flexible; with removal of                            tumor(s), polyp(s), or other lesion(s) by snare                            technique Diagnosis Code(s):        --- Professional ---                           Z86.010, Personal history of colonic polyps                           Z98.890, Other specified postprocedural states                           D12.3, Benign neoplasm of transverse colon (hepatic                            flexure or splenic flexure)  D12.4, Benign neoplasm of descending colon                           K64.1, Second degree hemorrhoids                           K57.30, Diverticulosis of large intestine without                             perforation or abscess without bleeding                           Q43.8, Other specified congenital malformations of                            intestine CPT copyright 2022 American Medical Association. All rights reserved. The codes documented in this report are preliminary and upon coder review may  be revised to meet current compliance requirements. Corliss Parish, MD 01/26/2023 12:55:14 PM Number of Addenda: 0

## 2023-01-26 NOTE — Transfer of Care (Signed)
Immediate Anesthesia Transfer of Care Note  Patient: Shyli Wood  Procedure(s) Performed: COLONOSCOPY WITH PROPOFOL BIOPSY ENDOSCOPIC MUCOSAL RESECTION SUBMUCOSAL LIFTING INJECTION HEMOSTASIS CONTROL SUBMUCOSAL TATTOO INJECTION POLYPECTOMY  Patient Location: PACU and Endoscopy Unit  Anesthesia Type:MAC  Level of Consciousness: drowsy and patient cooperative  Airway & Oxygen Therapy: Patient Spontanous Breathing and Patient connected to nasal cannula oxygen  Post-op Assessment: Report given to RN and Post -op Vital signs reviewed and stable  Post vital signs: Reviewed and stable  Last Vitals:  Vitals Value Taken Time  BP 127/81 01/26/23 1250  Temp    Pulse 53 01/26/23 1252  Resp 16 01/26/23 1252  SpO2 98 % 01/26/23 1252  Vitals shown include unfiled device data.  Last Pain:  Vitals:   01/26/23 1028  TempSrc: Temporal  PainSc: 0-No pain         Complications: No notable events documented.

## 2023-01-26 NOTE — H&P (Signed)
GASTROENTEROLOGY PROCEDURE H&P NOTE   Primary Care Physician: Wanda Plump, MD  HPI: Erin Fox is a 62 y.o. female who presents for colonoscopy for follow-up of previous SSP status post piecemeal resection in 2023.  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   Wears glasses    Past Surgical History:  Procedure Laterality Date   5th toes resected bilateral  1990's   bone's removed   ABDOMINAL HYSTERECTOMY  1984   W/ UNILATERAL SALPINGOOPHORECTOMY   ABDOMINOPLASTY  1990   BREAST BIOPSY Right 2001   CESAREAN SECTION     COLONOSCOPY     COLONOSCOPY W/ POLYPECTOMY  last one 02-14-2014   COLONOSCOPY WITH PROPOFOL N/A 05/23/2021   Procedure: COLONOSCOPY WITH PROPOFOL;  Surgeon: Lemar Lofty., MD;  Location[facility] ENDOSCOPY;  Service: Gastroenterology;  Laterality: N/A;   CYSTO/  RETROGRADE PYELOGRAM/  LEFT URETEROSCOPY/  BLADDER BIOPSY  08/27/2004   CYSTOSCOPY W/ RETROGRADES Bilateral 03/17/2014   Procedure: CYSTOSCOPY WITH RETROGRADE PYELOGRAM;  Surgeon: Sebastian Ache, MD;  Location[facility];  Service: Urology;  Laterality: Bilateral;   CYSTOSCOPY WITH BIOPSY N/A 03/17/2014   Procedure: CYSTOSCOPY WITH BIOPSY AND FULGERATION;  Surgeon: Sebastian Ache, MD;  Location: [facility];  Service: Urology;  Laterality: N/A;   ENDOSCOPIC MUCOSAL RESECTION N/A 05/23/2021   Procedure: ENDOSCOPIC MUCOSAL  RESECTION;  Surgeon: Meridee Score Netty Starring., MD;  Location: [facility] ENDOSCOPY;  Service: Gastroenterology;  Laterality: N/A;   HEMOSTASIS CLIP PLACEMENT  05/23/2021   Procedure: HEMOSTASIS CLIP PLACEMENT;  Surgeon: Lemar Lofty., MD;  Location: [facility] ENDOSCOPY;  Service: Gastroenterology;;   OTHER SURGICAL HISTORY Left 2016   Cyst Removal on Left ear   POLYPECTOMY     POLYPECTOMY  05/23/2021   Procedure: POLYPECTOMY;  Surgeon: Mansouraty, Netty Starring., MD;  Location: [facility] ENDOSCOPY;  Service: Gastroenterology;;   REMOVAL BARTHOLIN CYST/ GLAND AND REVISION LEFT LABIA  06/10/2004   SUBMUCOSAL LIFTING INJECTION  05/23/2021   Procedure: SUBMUCOSAL LIFTING INJECTION;  Surgeon: Lemar Lofty., MD;  Location: [facility] ENDOSCOPY;  Service: Gastroenterology;;   No current facility-administered medications for this encounter.   No current facility-administered medications for this encounter. No Known Allergies Family History  Problem Relation Age of Onset   Colon polyps Mother    Hyperlipidemia Mother    Hypertension Mother        M and sister    Diabetes Father    Colon polyps Sister 68       8 polyps removed   High blood pressure Sister    Cancer Paternal Aunt        lung   Colon cancer Paternal Uncle    Colon cancer Other        uncle , dx at age 59s   Breast cancer Neg Hx  Esophageal cancer Neg Hx    Rectal cancer Neg Hx    Stomach cancer Neg Hx    Social History   Socioeconomic History   Marital status: Divorced    Spouse name: Not on file   Number of children: 1   Years of education: Not on file   Highest education level: Not on file  Occupational History   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[employer])  Tobacco Use   Smoking status: Every Day    Current packs/day: 0.50    Average packs/day: 0.5 packs/day for 30.0 years (15.0 ttl pk-yrs)    Types: Cigarettes   Smokeless tobacco: Never   Tobacco comments:    Smoked since age ~78    1/2  ppd  Vaping Use   Vaping  status: Never Used  Substance and Sexual Activity   Alcohol use: Yes    Alcohol/week: 0.0 standard drinks of alcohol    Comment: OCCASIONAL   Drug use: Yes    Types: Marijuana    Comment: at night  Last smoked marajuana 02/21/21   Sexual activity: Not on file  Other Topics Concern   Not on file  Social History Narrative   Her mother lives w/ her, she is elderly, has memory issues, + stress for [employer]       Social Drivers of Health   Financial Resource Strain: Not on file  Food Insecurity: Not on file  Transportation Needs: Not on file  Physical Activity: Not on file  Stress: Not on file  Social Connections: Not on file  Intimate Partner Violence: Not on file    Physical Exam: Today's Vitals   01/20/23 1134 01/26/23 1028  BP:  (!) 130/91  Pulse:  (!) 54  Resp:  20  Temp:  97.6 F (36.4 C)  TempSrc:  Temporal  SpO2:  99%  Weight: 71.7 kg 73.9 kg  Height: 5' 7.5" (1.715 m) 5\' 7"  (1.702 m)  PainSc:  0-No pain   Body mass index is 25.53 kg/m. GEN: NAD EYE: Sclerae anicteric ENT: MMM CV: Non-tachycardic GI: Soft, NT/ND NEURO:  Alert & Oriented x 3  Lab Results: No results for input(s): "WBC", "HGB", "HCT", "PLT" in the last 72 hours. BMET No results for input(s): "NA", "K", "CL", "CO2", "GLUCOSE", "BUN", "CREATININE", "CALCIUM" in the last 72 hours. LFT No results for input(s): "PROT", "ALBUMIN", "AST", "ALT", "ALKPHOS", "BILITOT", "BILIDIR", "IBILI" in the last 72 hours. PT/INR No results for input(s): "LABPROT", "INR" in the last 72 hours.   Impression / Plan: This is a 62 y.o.female who presents for colonoscopy for follow-up of previous SSP status post piecemeal resection in 2023.  The risks and benefits of endoscopic evaluation/treatment were discussed with the patient and/or family; these include but are not limited to the risk of perforation, infection, bleeding, missed lesions, lack of diagnosis, severe illness requiring hospitalization, as well as  anesthesia and sedation related illnesses.  The patient's history has been reviewed, patient examined, no change in status, and deemed stable for procedure.  The patient and/or family is agreeable to proceed.    Corliss Parish, MD [facility] Advanced [facility]oscopy Office # [phone]

## 2023-01-26 NOTE — Discharge Instructions (Signed)

## 2023-01-26 NOTE — Anesthesia Preprocedure Evaluation (Signed)
Anesthesia Evaluation  Patient identified by MRN, date of birth, ID band Patient awake    Reviewed: Allergy & Precautions, NPO status , Patient's Chart, lab work & pertinent test results  Airway Mallampati: II  TM Distance: >3 FB Neck ROM: Full    Dental no notable dental hx.    Pulmonary neg pulmonary ROS, Current Smoker and Patient abstained from smoking.   Pulmonary exam normal        Cardiovascular hypertension, Pt. on medications and Pt. on home beta blockers  Rhythm:Regular Rate:Normal     Neuro/Psych   Anxiety     negative neurological ROS     GI/Hepatic negative GI ROS, Neg liver ROS,,,  Endo/Other  negative endocrine ROS    Renal/GU   negative genitourinary   Musculoskeletal negative musculoskeletal ROS (+)    Abdominal Normal abdominal exam  (+)   Peds  Hematology Lab Results      Component                Value               Date                      WBC                      3.8 (L)             03/28/2022                HGB                      13.3                03/28/2022                HCT                      39.3                03/28/2022                MCV                      97.8                03/28/2022                PLT                      235.0               03/28/2022              Anesthesia Other Findings   Reproductive/Obstetrics                             Anesthesia Physical Anesthesia Plan  ASA: 2  Anesthesia Plan: MAC   Post-op Pain Management:    Induction: Intravenous  PONV Risk Score and Plan: 1 and Propofol infusion and Treatment may vary due to age or medical condition  Airway Management Planned: Simple Face Mask and Nasal Cannula  Additional Equipment: None  Intra-op Plan:   Post-operative Plan:   Informed Consent: I have reviewed the patients History and Physical, chart, labs and discussed the procedure including the risks, benefits  and alternatives for  the proposed anesthesia with the patient or authorized representative who has indicated his/her understanding and acceptance.     Dental advisory given  Plan Discussed with: CRNA  Anesthesia Plan Comments:        Anesthesia Quick Evaluation

## 2023-01-27 LAB — SURGICAL PATHOLOGY

## 2023-01-27 NOTE — Anesthesia Postprocedure Evaluation (Signed)
Anesthesia Post Note  Patient: Erin Fox  Procedure(s) Performed: COLONOSCOPY WITH PROPOFOL BIOPSY ENDOSCOPIC MUCOSAL RESECTION SUBMUCOSAL LIFTING INJECTION HEMOSTASIS CONTROL SUBMUCOSAL TATTOO INJECTION POLYPECTOMY     Patient location during evaluation: PACU Anesthesia Type: MAC Level of consciousness: awake and alert Pain management: pain level controlled Vital Signs Assessment: post-procedure vital signs reviewed and stable Respiratory status: spontaneous breathing, nonlabored ventilation, respiratory function stable and patient connected to nasal cannula oxygen Cardiovascular status: stable and blood pressure returned to baseline Postop Assessment: no apparent nausea or vomiting Anesthetic complications: no   No notable events documented.  Last Vitals:  Vitals:   01/26/23 1310 01/26/23 1315  BP: (!) 147/77 (!) 150/77  Pulse: (!) 50 (!) 46  Resp: 12 (!) 24  Temp:    SpO2: 99% 99%    Last Pain:  Vitals:   01/27/23 1629  TempSrc:   PainSc: 0-No pain                 Earl Lites P Jerricka Carvey

## 2023-01-28 ENCOUNTER — Encounter: Payer: Self-pay | Admitting: Gastroenterology

## 2023-01-28 DIAGNOSIS — I498 Other specified cardiac arrhythmias: Secondary | ICD-10-CM | POA: Diagnosis not present

## 2023-01-28 DIAGNOSIS — Z79899 Other long term (current) drug therapy: Secondary | ICD-10-CM | POA: Diagnosis not present

## 2023-01-28 DIAGNOSIS — Z9889 Other specified postprocedural states: Secondary | ICD-10-CM | POA: Diagnosis not present

## 2023-01-28 DIAGNOSIS — R0902 Hypoxemia: Secondary | ICD-10-CM | POA: Diagnosis not present

## 2023-01-28 DIAGNOSIS — E785 Hyperlipidemia, unspecified: Secondary | ICD-10-CM | POA: Diagnosis not present

## 2023-01-28 DIAGNOSIS — R001 Bradycardia, unspecified: Secondary | ICD-10-CM | POA: Diagnosis not present

## 2023-01-28 DIAGNOSIS — R531 Weakness: Secondary | ICD-10-CM | POA: Diagnosis not present

## 2023-01-28 DIAGNOSIS — Y998 Other external cause status: Secondary | ICD-10-CM | POA: Diagnosis not present

## 2023-01-28 DIAGNOSIS — F17210 Nicotine dependence, cigarettes, uncomplicated: Secondary | ICD-10-CM | POA: Diagnosis not present

## 2023-01-28 DIAGNOSIS — R55 Syncope and collapse: Secondary | ICD-10-CM | POA: Diagnosis not present

## 2023-01-28 DIAGNOSIS — D320 Benign neoplasm of cerebral meninges: Secondary | ICD-10-CM | POA: Diagnosis not present

## 2023-01-28 DIAGNOSIS — D329 Benign neoplasm of meninges, unspecified: Secondary | ICD-10-CM | POA: Diagnosis not present

## 2023-01-28 DIAGNOSIS — R4182 Altered mental status, unspecified: Secondary | ICD-10-CM | POA: Diagnosis not present

## 2023-01-28 DIAGNOSIS — F419 Anxiety disorder, unspecified: Secondary | ICD-10-CM | POA: Diagnosis not present

## 2023-01-28 DIAGNOSIS — Y9289 Other specified places as the place of occurrence of the external cause: Secondary | ICD-10-CM | POA: Diagnosis not present

## 2023-01-28 DIAGNOSIS — W19XXXA Unspecified fall, initial encounter: Secondary | ICD-10-CM | POA: Diagnosis not present

## 2023-01-28 DIAGNOSIS — I10 Essential (primary) hypertension: Secondary | ICD-10-CM | POA: Diagnosis not present

## 2023-01-28 DIAGNOSIS — Z860100 Personal history of colon polyps, unspecified: Secondary | ICD-10-CM | POA: Diagnosis not present

## 2023-01-29 ENCOUNTER — Encounter (HOSPITAL_COMMUNITY): Payer: Self-pay | Admitting: Gastroenterology

## 2023-01-29 DIAGNOSIS — R001 Bradycardia, unspecified: Secondary | ICD-10-CM | POA: Diagnosis not present

## 2023-01-29 DIAGNOSIS — R4182 Altered mental status, unspecified: Secondary | ICD-10-CM | POA: Diagnosis not present

## 2023-01-29 DIAGNOSIS — R55 Syncope and collapse: Secondary | ICD-10-CM | POA: Diagnosis not present

## 2023-02-01 DIAGNOSIS — J029 Acute pharyngitis, unspecified: Secondary | ICD-10-CM | POA: Diagnosis not present

## 2023-02-18 DIAGNOSIS — I83892 Varicose veins of left lower extremities with other complications: Secondary | ICD-10-CM | POA: Diagnosis not present

## 2023-03-03 DIAGNOSIS — I87392 Chronic venous hypertension (idiopathic) with other complications of left lower extremity: Secondary | ICD-10-CM | POA: Diagnosis not present

## 2023-03-03 DIAGNOSIS — I83892 Varicose veins of left lower extremities with other complications: Secondary | ICD-10-CM | POA: Diagnosis not present

## 2023-03-10 DIAGNOSIS — I83813 Varicose veins of bilateral lower extremities with pain: Secondary | ICD-10-CM | POA: Diagnosis not present

## 2023-03-11 DIAGNOSIS — M79605 Pain in left leg: Secondary | ICD-10-CM | POA: Diagnosis not present

## 2023-03-25 ENCOUNTER — Ambulatory Visit: Payer: Self-pay | Admitting: Internal Medicine

## 2023-03-25 NOTE — Telephone Encounter (Signed)
Copied from CRM [id number]. Topic: Clinical - Red Word Triage >> Mar 25, 2023  3:13 PM Irine Seal wrote: [facility] that prompted transfer to Nurse Triage: intermittent neck pain, in the back of neck that radiates into shoulder, patient stated this has been off and on since October 2024, daily tylenol and bio freeze offer some relief, but the pain can be severe at time. No headaches, no dizziness. No other symptoms   Chief Complaint: Neck pain Symptoms: Pain to the back of the right neck radiating into her shoulder  Frequency: Intermittent  Pertinent Negatives: Patient denies any numbness or tingling  Disposition: [] ED /[] Urgent Care (no appt availability in office) / [x] Appointment(In office/virtual)/ []  [facility]/ [] Home Care/ [] Refused Recommended Disposition /[] [facility]/ []  Follow-up with PCP Additional Notes: Patient reports she has been experiencing intermittent neck pain since October. She states that her pain has not been improving since the onset and would like to be seen in the office. She denies any neurologic symptoms or changes to her pain. Appointment made for the patient tomorrow.    Reason for Disposition  Neck pain is a chronic symptom (recurrent or ongoing AND present > 4 weeks)  Answer Assessment - Initial Assessment Questions 1. ONSET: "When did the pain begin?"      Going on since October  2. LOCATION: "Where does it hurt?"      Right side of the back of the neck   3. PATTERN "Does the pain come and go, or has it been constant since it started?"      Intermittent  4. SEVERITY: "How bad is the pain?"  (Scale 1-10; or mild, moderate, severe)   - NO PAIN (0): no pain or only slight stiffness    - MILD (1-3): doesn't interfere with normal activities    - MODERATE (4-7): interferes with normal activities or awakens from sleep    - SEVERE (8-10):  excruciating pain, unable to do any normal activities      8/10 5. RADIATION: "Does the pain go anywhere  else, shoot into your arms?"     Radiates to the right shoulder  6. CORD SYMPTOMS: "Any weakness or numbness of the arms or legs?"     No 7. CAUSE: "What do you think is causing the neck pain?"     Unsure 8. NECK OVERUSE: "Any recent activities that involved turning or twisting the neck?"     No 9. OTHER SYMPTOMS: "Do you have any other symptoms?" (e.g., headache, fever, chest pain, difficulty breathing, neck swelling)     No 10. PREGNANCY: "Is there any chance you are pregnant?" "When was your last menstrual period?"       No  Protocols used: Neck Pain or Stiffness-A-AH

## 2023-03-25 NOTE — Progress Notes (Unsigned)
      Established patient visit   Patient: Erin Fox   DOB: [birth year]   63 y.o. Female  MRN: [id number] Visit Date: 03/26/2023  Today's healthcare provider: Alfredia Ferguson, PA-C   No chief complaint on file.  Subjective     ***  Medications: Outpatient Medications Prior to Visit  Medication Sig   acyclovir (ZOVIRAX) 400 MG tablet Take 1 tablet (400 mg total) by mouth 2 (two) times daily as needed.   albuterol (VENTOLIN HFA) 108 (90 Base) MCG/ACT inhaler Inhale 2 puffs into the lungs every 6 (six) hours as needed for wheezing or shortness of breath.   amLODipine (NORVASC) 5 MG tablet Take 1 tablet (5 mg total) by mouth daily.   aspirin EC 81 MG tablet Take 1 tablet (81 mg total) by mouth every other day. Swallow whole.   budesonide-formoterol (BREYNA) 160-4.5 MCG/ACT inhaler Inhale 2 puffs into the lungs 2 (two) times daily.   carvedilol (COREG) 12.5 MG tablet Take 1 tablet (12.5 mg total) by mouth 2 (two) times daily with a meal.   Cholecalciferol (VITAMIN D) 50 MCG (2000 UT) tablet Take 2,000 Units by mouth daily.   fluticasone (FLONASE) 50 MCG/ACT nasal spray Use 2 spray(s) in each nostril once daily   folic acid (FOLVITE) 1 MG tablet Take 5 tablets (5 mg total) by mouth daily.   PREVIDENT 5000 BOOSTER PLUS 1.1 % PSTE Place 1 application. onto teeth daily.   simvastatin (ZOCOR) 20 MG tablet Take 1 tablet (20 mg total) by mouth at bedtime.   No facility-administered medications prior to visit.    Review of Systems {Insert previous labs (optional):23779} {See past labs  Heme  Chem  Endocrine  Serology  Results Review (optional):1}   Objective    There were no vitals taken for this visit. {Insert last BP/Wt (optional):23777}{See vitals history (optional):1}  Physical Exam  ***  No results found for any visits on 03/26/23.  Assessment & Plan    There are no diagnoses linked to this encounter.  ***  No follow-ups on file.       Alfredia Ferguson,  PA-C  [facility] [phone] (phone) [phone] (fax)  [facility]

## 2023-03-26 ENCOUNTER — Encounter: Payer: Self-pay | Admitting: Physician Assistant

## 2023-03-26 ENCOUNTER — Ambulatory Visit (INDEPENDENT_AMBULATORY_CARE_PROVIDER_SITE_OTHER): Payer: BC Managed Care – PPO | Admitting: Physician Assistant

## 2023-03-26 VITALS — BP 122/80 | HR 51 | Temp 98.7°F | Resp 16 | Ht 67.0 in | Wt 160.1 lb

## 2023-03-26 DIAGNOSIS — D329 Benign neoplasm of meninges, unspecified: Secondary | ICD-10-CM | POA: Insufficient documentation

## 2023-03-26 DIAGNOSIS — M542 Cervicalgia: Secondary | ICD-10-CM | POA: Diagnosis not present

## 2023-03-26 MED ORDER — CYCLOBENZAPRINE HCL 5 MG PO TABS: 5.0000 mg | ORAL_TABLET | Freq: Three times a day (TID) | ORAL | 1 refills | Status: DC | PRN

## 2023-03-30 ENCOUNTER — Ambulatory Visit (INDEPENDENT_AMBULATORY_CARE_PROVIDER_SITE_OTHER): Payer: BC Managed Care – PPO | Admitting: Internal Medicine

## 2023-03-30 ENCOUNTER — Encounter: Payer: Self-pay | Admitting: Internal Medicine

## 2023-03-30 VITALS — BP 126/80 | HR 53 | Temp 98.0°F | Resp 18 | Ht 67.0 in | Wt 162.1 lb

## 2023-03-30 DIAGNOSIS — R739 Hyperglycemia, unspecified: Secondary | ICD-10-CM

## 2023-03-30 DIAGNOSIS — E538 Deficiency of other specified B group vitamins: Secondary | ICD-10-CM | POA: Diagnosis not present

## 2023-03-30 DIAGNOSIS — D329 Benign neoplasm of meninges, unspecified: Secondary | ICD-10-CM | POA: Diagnosis not present

## 2023-03-30 DIAGNOSIS — I10 Essential (primary) hypertension: Secondary | ICD-10-CM | POA: Diagnosis not present

## 2023-03-30 DIAGNOSIS — E785 Hyperlipidemia, unspecified: Secondary | ICD-10-CM | POA: Diagnosis not present

## 2023-03-30 DIAGNOSIS — Z0001 Encounter for general adult medical examination with abnormal findings: Secondary | ICD-10-CM

## 2023-03-30 DIAGNOSIS — F17200 Nicotine dependence, unspecified, uncomplicated: Secondary | ICD-10-CM

## 2023-03-30 DIAGNOSIS — Z0000 Encounter for general adult medical examination without abnormal findings: Secondary | ICD-10-CM

## 2023-03-30 LAB — LIPID PANEL
Cholesterol: 215 mg/dL — ABNORMAL HIGH (ref 0–200)
HDL: 106.9 mg/dL (ref >39.00)
LDL Cholesterol: 89 mg/dL (ref 0–99)
NonHDL: 108.41
Total CHOL/HDL Ratio: 2
Triglycerides: 98 mg/dL (ref 0.0–149.0)
VLDL: 19.6 mg/dL (ref 0.0–40.0)

## 2023-03-30 LAB — FOLATE: Folate: >25.2 ng/mL (ref >5.9)

## 2023-03-30 LAB — HEMOGLOBIN A1C: Hgb A1c MFr Bld: 5.4 % (ref 4.6–6.5)

## 2023-03-30 MED ORDER — ASPIRIN 81 MG PO TBEC: 81.0000 mg | DELAYED_RELEASE_TABLET | ORAL | Status: DC | PRN

## 2023-03-30 NOTE — Patient Instructions (Addendum)
  Check the  blood pressure regularly Blood pressure goal:  between 110/65 and  135/85. If it is consistently higher or lower, let me know     GO TO THE LAB : Get the blood work     Next visit with me in 6 months Please schedule it at the front desk        "Health Care Power of attorney" ,  "Living will" (Advance care planning documents)  If you already have a living will or healthcare power of attorney, is recommended you bring the copy to be scanned in your chart.   The document will be available to all the doctors you see in the system.  Advance care planning is a process that supports adults in  understanding and sharing their preferences regarding future medical care.  The patient's preferences are recorded in documents called Advance Directives and the can be modified at any time while the patient is in full mental capacity.   If you don't have one, please consider create one.      More information at: [email or web address]

## 2023-03-30 NOTE — Progress Notes (Unsigned)
Subjective:    Patient ID: Erin Fox, female    DOB: Apr 16, 1960, 63 y.o.   MRN: [id number]  DOS:  03/30/2023 Type of visit - description: CPX  Here for CPX. Has some neck pain. Went to the ER with AMS changes, that is largely resolved, was diagnosed with a meningioma. Concerned about the issue.   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   Wears glasses     Past Surgical History:  Procedure Laterality Date   5th toes resected bilateral  1990's   bone's removed   ABDOMINAL HYSTERECTOMY  1984   W/ UNILATERAL SALPINGOOPHORECTOMY   ABDOMINOPLASTY  1990   BIOPSY  01/26/2023   Procedure: BIOPSY;  Surgeon: Lemar Lofty., MD;  Location: [facility] ENDOSCOPY;  Service: Gastroenterology;;   BREAST BIOPSY Right 2001   CESAREAN SECTION     COLONOSCOPY     COLONOSCOPY W/ POLYPECTOMY  last one 02-14-2014   COLONOSCOPY WITH PROPOFOL N/A 05/23/2021   Procedure: COLONOSCOPY WITH PROPOFOL;  Surgeon: Lemar Lofty., MD;  Location: [facility] ENDOSCOPY;  Service: Gastroenterology;  Laterality: N/A;   COLONOSCOPY WITH PROPOFOL N/A 01/26/2023   Procedure: COLONOSCOPY WITH PROPOFOL;  Surgeon: Meridee Score Netty Starring., MD;  Location: [facility]OSCOPY;  Service: Gastroenterology;  Laterality: N/A;   CYSTO/  RETROGRADE PYELOGRAM/  LEFT URETEROSCOPY/  BLADDER BIOPSY  08/27/2004   CYSTOSCOPY W/ RETROGRADES Bilateral  03/17/2014   Procedure: CYSTOSCOPY WITH RETROGRADE PYELOGRAM;  Surgeon: Sebastian Ache, MD;  Location: [facility];  Service: Urology;  Laterality: Bilateral;   CYSTOSCOPY WITH BIOPSY N/A 03/17/2014   Procedure: CYSTOSCOPY WITH BIOPSY AND FULGERATION;  Surgeon: Sebastian Ache, MD;  Location: [facility];  Service: Urology;  Laterality: N/A;   ENDOSCOPIC MUCOSAL RESECTION N/A 05/23/2021   Procedure: ENDOSCOPIC MUCOSAL RESECTION;  Surgeon: Meridee Score Netty Starring., MD;  Location: [facility] ENDOSCOPY;  Service: Gastroenterology;  Laterality: N/A;   ENDOSCOPIC MUCOSAL RESECTION  01/26/2023   Procedure: ENDOSCOPIC MUCOSAL RESECTION;  Surgeon: Meridee Score Netty Starring., MD;  Location: [facility] ENDOSCOPY;  Service: Gastroenterology;;   HEMOSTASIS CLIP PLACEMENT  05/23/2021   Procedure: HEMOSTASIS CLIP PLACEMENT;  Surgeon: Lemar Lofty., MD;  Location: [facility] ENDOSCOPY;  Service: Gastroenterology;;   HEMOSTASIS CONTROL  01/26/2023   Procedure: HEMOSTASIS CONTROL;  Surgeon: Lemar Lofty., MD;  Location: [facility];  Service: Gastroenterology;;   OTHER SURGICAL HISTORY Left 2016   Cyst Removal on Left ear   POLYPECTOMY     POLYPECTOMY  05/23/2021   Procedure: POLYPECTOMY;  Surgeon: Lemar Lofty., MD;  Location: [facility] ENDOSCOPY;  Service: Gastroenterology;;   POLYPECTOMY  01/26/2023   Procedure: POLYPECTOMY;  Surgeon: Lemar Lofty., MD;  Location: [facility]OSCOPY;  Service: Gastroenterology;;   REMOVAL BARTHOLIN CYST/ GLAND AND REVISION LEFT LABIA  06/10/2004  SUBMUCOSAL LIFTING INJECTION  05/23/2021   Procedure: SUBMUCOSAL LIFTING INJECTION;  Surgeon: Meridee Score Netty Starring., MD;  Location: [facility] ENDOSCOPY;  Service: Gastroenterology;;   SUBMUCOSAL LIFTING INJECTION  01/26/2023   Procedure: SUBMUCOSAL LIFTING INJECTION;  Surgeon: Lemar Lofty., MD;  Location: [facility]OSCOPY;  Service: Gastroenterology;;   SUBMUCOSAL TATTOO INJECTION  01/26/2023    Procedure: SUBMUCOSAL TATTOO INJECTION;  Surgeon: Lemar Lofty., MD;  Location: [facility];  Service: Gastroenterology;;    Current Outpatient Medications  Medication Instructions   acyclovir (ZOVIRAX) 400 mg, Oral, 2 times daily PRN   albuterol (VENTOLIN HFA) 108 (90 Base) MCG/ACT inhaler 2 puffs, Every 6 hours PRN   amLODipine (NORVASC) 5 mg, Oral, Daily   aspirin EC 81 mg, Oral, Every other day, Swallow whole.   B-12 3,000 mcg, Daily   budesonide-formoterol (BREYNA) 160-4.5 MCG/ACT inhaler 2 puffs, 2 times daily   carvedilol (COREG) 12.5 mg, Oral, 2 times daily with meals   cyclobenzaprine (FLEXERIL) 5 mg, Oral, 3 times daily PRN   D3 5000 5,000 Units, Daily   fluticasone (FLONASE) 50 MCG/ACT nasal spray Use 2 spray(s) in each nostril once daily   folic acid (FOLVITE) 5 mg, Oral, Daily   PREVIDENT 5000 BOOSTER PLUS 1.1 % PSTE 1 application , Daily   simvastatin (ZOCOR) 20 mg, Oral, Daily at bedtime       Objective:   Physical Exam BP 126/80   Pulse (!) 53   Temp 98 F (36.7 C) (Oral)   Resp 18   Ht 5\' 7"  (1.702 m)   Wt 162 lb 2 oz (73.5 kg)   SpO2 97%   BMI 25.39 kg/m  General: Well developed, NAD, BMI noted Neck: No  thyromegaly  HEENT:  Normocephalic . Face symmetric, atraumatic Lungs:  CTA B Normal respiratory effort, no intercostal retractions, no accessory muscle use. Heart: RRR,  no murmur.  Abdomen:  Not distended, soft, non-tender. No rebound or rigidity.   Lower extremities: Left leg is slightly larger.  No pitting edema, at baseline. Skin: Exposed areas without rash. Not pale. Not jaundice Neurologic:  alert & oriented X3.  Speech normal, gait appropriate for age and unassisted Strength symmetric and appropriate for age.  Psych: Cognition and judgment appear intact.  Cooperative with normal attention span and concentration.  Behavior appropriate. No anxious or depressed appearing.      Assessment    Assessment HTN: Onset  07-2016 Anxiety, insomnia Hyperlipidemia Reacive airway DZ dx 2019 HSV L leg edema,Venous insufficiency, since the 80s after she had her child.  Hemorrhoids- banding x 3 2020 IBS? Sees dermatology regularly Mild folic acid deficiency (labs 01/2020). B12  dx  Def 2024 Osteopenia: -2.1 (2024). Meningioma:  per ER CT  01/28/2023   GU: Over active bladder, s/p cysto   >1, 2011, 2016, etc, see urology notes  H/o hematuria- s/p extensive w/u ~ 2016 Non-obstructive nephrolithiasis  PLAN Here for CPX - Td 2016 ; s/p shingrex - had a flu and covid vax   - h/o hysterectomy for benign reasons,saw gyn  had a Pap 02/01/2018 ([facility]); saw gyn 08/2022  --MMG 09-2022 ([facility]) --CCS: Cscope 11/ 2010: Polyps, diverticula, hemorrhoids. Cscope 1-16, no polyps, next 7 years.  C-scope 02/22/2021, C-scope 01/26/2023.  Next per GI. --Tobacco abuse: Smoking mostly 1/2 ppd, at times 3/4 ppd, started at age [age].  She is essentially at 20 pack a year, we agreed to refer her to LDCT for lung cancer screening. Not ready to quit tobacco just yet.      --  Labs from December 2024: RPR negative.  CMP okay.  Hemoglobin normal.  TSH 3.3. Vitamin D normal.  B12 more than 1500. Check FLP, A1c, folic acid -ACP info provided  For issues addressed:  HTN: On amlodipine, carvedilol, no ambulatory BPs, BP today is very good.  No change. Hyperlipidemia: On simvastatin.  Check FLP. Left leg edema: Sees a venous specialist regularly  Osteopenia: T-score -2.1 03-2022, repeat in 3 years.  Encouraged calcium and vitamin D. Vitamin deficiencies: Check a folic acid. Meningioma: Incidental finding after she presented with mental status changes to the ER December 2024 (MS changes due to a sedative from her dentist, name of sedative? Request a referral to neurology .  Will arrange Neck pain: Seen at this office but another provider, to have PT. RTC 6 months  The ASCVD Risk score (Arnett DK, et al., 2019) failed to calculate for  the following reasons:   The valid HDL cholesterol range is 20 to 100 mg/dL   ===== HTN: BP looks good, continue amlodipine, carvedilol, monitor BPs at home High cholesterol: On simvastatin, last LDL satisfactory.  See next Cardiovascular risk: 8.5% Risk mostly related to tobacco.  Quitting d/w pt Also  was recommended to start aspirin but d/c d/t easy bruising. Benefits of aspirin again discussed, she will try to take 1 tablet every other day. Folic acid and B12 deficiencies: Labs from 03-2022 showed severely decreased B12.  Was Rx supplements but did not take them, encouraged to do. Vaccine advice: Flu shot every fall, RSV, COVID booster if not done recently Due for a colonoscopy, having a hard time finding somebody to bring her back home after a cscope  Reactive airway disease: Fortunately doing well, uses albuterol rarely RTC 6 months CPX

## 2023-03-31 ENCOUNTER — Encounter: Payer: Self-pay | Admitting: Internal Medicine

## 2023-03-31 NOTE — Assessment & Plan Note (Signed)
Here for CPX  Other issues discussed today: HTN: On amlodipine, carvedilol, no ambulatory BPs, BP today is very good.  No change. Hyperlipidemia: On simvastatin.  Check FLP. Left leg edema: Sees a venous specialist regularly Osteopenia: T-score -2.1  03-2022, repeat in 3 years.  Encouraged calcium and vitamin D. Vitamin deficiencies: Check a folic acid. Meningioma: Incidental finding after she presented with mental status changes to the ER December 2024 (MS changes felt to be due to a sedative from her dentist, name of sedative?) Request a referral to neurology .  Will arrange Neck pain: Seen at this office but another provider, to have PT. RTC 6 months

## 2023-03-31 NOTE — Assessment & Plan Note (Signed)
Here for CPX - Td 2016 ; s/p shingrex - had a flu and covid vax   - h/o hysterectomy for benign reasons,saw gyn  had a Pap 02/01/2018 ([facility]); saw gyn 08/2022  --MMG 09-2022 ([facility]) --CCS: Cscope 11/ 2010: Polyps, diverticula, hemorrhoids. Cscope 1-16, no polyps, next 7 years.  C-scope 02/22/2021, C-scope 01/26/2023.  Next per GI. --Tobacco abuse: Smoking mostly 1/2 ppd, at times 3/4 ppd, started at age [age].  She is essentially at 20 pack a year, we agreed to refer her to LDCT for lung cancer screening. Not ready to quit tobacco just yet. --Labs from December 2024: RPR negative.  CMP okay.  Hemoglobin normal.  TSH 3.3. Vitamin D normal.  B12 more than 1500. Plan: Check FLP, A1c, folic acid -ACP info provided

## 2023-04-01 ENCOUNTER — Encounter: Payer: Self-pay | Admitting: Internal Medicine

## 2023-04-06 DIAGNOSIS — M542 Cervicalgia: Secondary | ICD-10-CM | POA: Diagnosis not present

## 2023-04-09 DIAGNOSIS — M542 Cervicalgia: Secondary | ICD-10-CM | POA: Diagnosis not present

## 2023-04-14 DIAGNOSIS — M542 Cervicalgia: Secondary | ICD-10-CM | POA: Diagnosis not present

## 2023-04-16 ENCOUNTER — Other Ambulatory Visit: Payer: Self-pay | Admitting: Internal Medicine

## 2023-04-16 DIAGNOSIS — M542 Cervicalgia: Secondary | ICD-10-CM | POA: Diagnosis not present

## 2023-04-21 DIAGNOSIS — M542 Cervicalgia: Secondary | ICD-10-CM | POA: Diagnosis not present

## 2023-04-23 DIAGNOSIS — M542 Cervicalgia: Secondary | ICD-10-CM | POA: Diagnosis not present

## 2023-04-28 DIAGNOSIS — M542 Cervicalgia: Secondary | ICD-10-CM | POA: Diagnosis not present

## 2023-04-30 DIAGNOSIS — M542 Cervicalgia: Secondary | ICD-10-CM | POA: Diagnosis not present

## 2023-05-05 ENCOUNTER — Telehealth: Payer: Self-pay

## 2023-05-05 ENCOUNTER — Other Ambulatory Visit: Payer: Self-pay | Admitting: Internal Medicine

## 2023-05-05 DIAGNOSIS — Z09 Encounter for follow-up examination after completed treatment for conditions other than malignant neoplasm: Secondary | ICD-10-CM | POA: Diagnosis not present

## 2023-05-05 DIAGNOSIS — I8312 Varicose veins of left lower extremity with inflammation: Secondary | ICD-10-CM | POA: Diagnosis not present

## 2023-05-05 DIAGNOSIS — R600 Localized edema: Secondary | ICD-10-CM | POA: Diagnosis not present

## 2023-05-05 DIAGNOSIS — M542 Cervicalgia: Secondary | ICD-10-CM | POA: Diagnosis not present

## 2023-05-05 MED ORDER — BUDESONIDE-FORMOTEROL FUMARATE 160-4.5 MCG/ACT IN AERO: 2.0000 | INHALATION_SPRAY | Freq: Two times a day (BID) | RESPIRATORY_TRACT | 1 refills | Status: DC

## 2023-05-05 NOTE — Telephone Encounter (Signed)
 Copied from CRM [id number]. Topic: Clinical - Prescription Issue >> May 05, 2023  4:04 PM Adaysia C wrote: Reason for CRM: Danielle with [facility] pharmacy called to ask the provider if it is ok to dispense the generic brand of SYMBICORT 160-4.5 MCG/ACT inhaler to the patient so that the medication can be covered by the insurance and the patient requested the generic version; please follow up with pharmacy to answer [phone]

## 2023-05-05 NOTE — Telephone Encounter (Signed)
Rx resent for generic.

## 2023-05-06 DIAGNOSIS — D329 Benign neoplasm of meninges, unspecified: Secondary | ICD-10-CM | POA: Diagnosis not present

## 2023-05-21 ENCOUNTER — Other Ambulatory Visit: Payer: Self-pay | Admitting: Family

## 2023-05-26 ENCOUNTER — Other Ambulatory Visit: Payer: Self-pay

## 2023-05-26 MED ORDER — FLUTICASONE PROPIONATE 50 MCG/ACT NA SUSP: 2.0000 | Freq: Every day | NASAL | 1 refills | Status: AC

## 2023-05-26 MED ORDER — FLUTICASONE PROPIONATE 50 MCG/ACT NA SUSP: 2.0000 | Freq: Every day | NASAL | 1 refills | Status: DC

## 2023-05-26 NOTE — Telephone Encounter (Signed)
 Unsure why I'm unable to send electronically. Rx faxed to [facility].

## 2023-05-26 NOTE — Telephone Encounter (Signed)
 Copied from CRM [id number]. Topic: Clinical - Prescription Issue >> May 26, 2023 12:26 PM Erin Fox wrote: Reason for CRM: Patient following up on refill request for her fluticasone (FLONASE) 50 MCG/ACT nasal spray, refill shows pending since 4/10, please reach out to patient, thanks.  Erin Fox [phone]

## 2023-06-04 ENCOUNTER — Other Ambulatory Visit: Payer: Self-pay | Admitting: Internal Medicine

## 2023-06-19 ENCOUNTER — Ambulatory Visit (INDEPENDENT_AMBULATORY_CARE_PROVIDER_SITE_OTHER): Admitting: Internal Medicine

## 2023-06-19 ENCOUNTER — Encounter: Payer: Self-pay | Admitting: Internal Medicine

## 2023-06-19 VITALS — BP 122/80 | HR 56 | Temp 99.2°F | Resp 16 | Ht 67.0 in | Wt 157.1 lb

## 2023-06-19 DIAGNOSIS — M542 Cervicalgia: Secondary | ICD-10-CM | POA: Diagnosis not present

## 2023-06-19 MED ORDER — CYCLOBENZAPRINE HCL 5 MG PO TABS: 5.0000 mg | ORAL_TABLET | Freq: Three times a day (TID) | ORAL | 0 refills | Status: AC | PRN

## 2023-06-19 MED ORDER — PREDNISONE 10 MG PO TABS: ORAL_TABLET | ORAL | 0 refills | Status: DC

## 2023-06-19 NOTE — Progress Notes (Unsigned)
 Subjective:    Patient ID: Georgette Kins, female    DOB: [birth year], 63 y.o.   MRN: [id number]  DOS:  06/19/2023 Type of visit - description: Acute  Was seen at this office 03-2023, had neck pain, did physical therapy and feels better.  Symptoms have returned for the last week. Pain is at the posterior neck with some the radiation to the right trapezoid   area. Does not change with neck movement or arm motion. Denies any upper extremity paresthesias or weakness. No recent injury.   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   Wears glasses     Past Surgical History:  Procedure Laterality Date   5th toes resected bilateral  1990's   bone's removed   ABDOMINAL HYSTERECTOMY  1984   W/ UNILATERAL SALPINGOOPHORECTOMY   ABDOMINOPLASTY  1990   BIOPSY  01/26/2023   Procedure: BIOPSY;  Surgeon: Normie Becton., MD;  Location: [facility];  Service: Gastroenterology;;   BREAST BIOPSY Right 2001   CESAREAN SECTION     COLONOSCOPY     COLONOSCOPY W/ POLYPECTOMY  last one 02-14-2014   COLONOSCOPY WITH PROPOFOL  N/A 05/23/2021   Procedure: COLONOSCOPY WITH PROPOFOL ;  Surgeon: Normie Becton., MD;  Location: [facility] ENDOSCOPY;  Service: Gastroenterology;  Laterality: N/A;   COLONOSCOPY WITH PROPOFOL  N/A 01/26/2023   Procedure: COLONOSCOPY WITH PROPOFOL ;  Surgeon: Mansouraty, Albino Alu., MD;  Location:  [facility];  Service: Gastroenterology;  Laterality: N/A;   CYSTO/  RETROGRADE PYELOGRAM/  LEFT URETEROSCOPY/  BLADDER BIOPSY  08/27/2004   CYSTOSCOPY W/ RETROGRADES Bilateral 03/17/2014   Procedure: CYSTOSCOPY WITH RETROGRADE PYELOGRAM;  Surgeon: Osborn Blaze, MD;  Location: [facility];  Service: Urology;  Laterality: Bilateral;   CYSTOSCOPY WITH BIOPSY N/A 03/17/2014   Procedure: CYSTOSCOPY WITH BIOPSY AND FULGERATION;  Surgeon: Osborn Blaze, MD;  Location: [facility];  Service: Urology;  Laterality: N/A;   ENDOSCOPIC MUCOSAL RESECTION N/A 05/23/2021   Procedure: ENDOSCOPIC MUCOSAL RESECTION;  Surgeon: Brice Campi Albino Alu., MD;  Location: [facility] ENDOSCOPY;  Service: Gastroenterology;  Laterality: N/A;   ENDOSCOPIC MUCOSAL RESECTION  01/26/2023   Procedure: ENDOSCOPIC MUCOSAL RESECTION;  Surgeon: Brice Campi Albino Alu., MD;  Location: [facility];  Service: Gastroenterology;;   HEMOSTASIS CLIP PLACEMENT  05/23/2021   Procedure: HEMOSTASIS CLIP PLACEMENT;  Surgeon: Normie Becton., MD;  Location: [facility] ENDOSCOPY;  Service: Gastroenterology;;   HEMOSTASIS CONTROL  01/26/2023   Procedure: HEMOSTASIS CONTROL;  Surgeon: Normie Becton., MD;  Location: [facility]OSCOPY;  Service: Gastroenterology;;   OTHER SURGICAL HISTORY Left 2016   Cyst Removal on Left ear   POLYPECTOMY     POLYPECTOMY  05/23/2021   Procedure: POLYPECTOMY;  Surgeon: Normie Becton., MD;  Location: [facility] ENDOSCOPY;  Service: Gastroenterology;;   POLYPECTOMY  01/26/2023   Procedure: POLYPECTOMY;  Surgeon: Brice Campi Albino Alu., MD;  Location: [facility];  Service: Gastroenterology;;   REMOVAL BARTHOLIN CYST/ GLAND AND REVISION LEFT LABIA  06/10/2004   SUBMUCOSAL LIFTING INJECTION  05/23/2021   Procedure: SUBMUCOSAL LIFTING INJECTION;  Surgeon: Normie Becton., MD;  Location: [facility] ENDOSCOPY;  Service: Gastroenterology;;   SUBMUCOSAL LIFTING INJECTION  01/26/2023    Procedure: SUBMUCOSAL LIFTING INJECTION;  Surgeon: Normie Becton., MD;  Location: [facility];  Service: Gastroenterology;;   SUBMUCOSAL TATTOO INJECTION  01/26/2023   Procedure: SUBMUCOSAL TATTOO INJECTION;  Surgeon: Normie Becton., MD;  Location: [facility] ENDOSCOPY;  Service: Gastroenterology;;    Current Outpatient Medications  Medication Instructions   acyclovir  (ZOVIRAX ) 400 mg, Oral, 2 times daily PRN   albuterol  (VENTOLIN  HFA) 108 (90 Base) MCG/ACT inhaler 2 puffs, Every 6 hours PRN   amLODipine  (NORVASC ) 5 mg, Oral, Daily   aspirin  EC 81 mg, Oral, Every 48 hours PRN, Swallow whole.   B-12 3,000 mcg, Daily   budesonide -formoterol  (SYMBICORT ) 160-4.5 MCG/ACT inhaler 2 puffs, Inhalation, 2 times daily   carvedilol  (COREG ) 12.5 mg, Oral, 2 times daily with meals   cyclobenzaprine  (FLEXERIL ) 5 mg, Oral, 3 times daily PRN   D3 5000 5,000 Units, Daily   fluticasone  (FLONASE ) 50 MCG/ACT nasal spray 2 sprays, Each Nare, Daily   folic acid  (FOLVITE ) 5 mg, Oral, Daily   PREVIDENT 5000 BOOSTER PLUS 1.1 % PSTE 1 application , Daily   simvastatin  (ZOCOR ) 20 mg, Oral, Daily at bedtime       Objective:   Physical Exam Neck:     BP 122/80   Pulse (!) 56   Temp 99.2 F (37.3 C) (Oral)   Resp 16   Ht 5\' 7"  (1.702 m)   Wt 157 lb 2 oz (71.3 kg)   SpO2 98%   BMI 24.61 kg/m  General:   Well developed, NAD, BMI noted. HEENT:  Normocephalic . Face symmetric, atraumatic Neck: No TTP of the cervical spine Shoulders: ROM normal  skin: Not pale. Not jaundice Neurologic:  alert & oriented X3.  Speech normal, gait appropriate for age and unassisted Motor and DTR symmetric Psych--  Cognition and judgment appear intact.  Cooperative with normal attention span and concentration.  Behavior appropriate. No anxious or depressed appearing.      Assessment     Assessment HTN: Onset 07-2016 Anxiety, insomnia Hyperlipidemia Reacive airway DZ dx 2019 HSV L leg edema,Venous  insufficiency, since the 80s after she had her child. Hemorrhoids- banding x 3 2020 IBS? Sees dermatology regularly Mild folic acid  deficiency (labs 01/2020). B12  dx  Def 2024 Osteopenia: -2.1 (2024). Meningioma: incidental per ER CT  01/28/2023   GU: Over active bladder, s/p cysto   >1, 2011, 2016, etc, see urology notes  H/o hematuria- s/p extensive w/u ~ 2016 Non-obstructive nephrolithiasis  PLAN Neck pain: Neck pain with some radiation to the right without radiculopathy symptoms. Initially seen here 03-2023, was Rx Flexeril  which she took without side effects, did physical therapy and felt better.  Symptoms have returned for the last week. Neuroexam is benign.  Plan: Round of prednisone , Tylenol , heating pad, Flexeril  as needed, watch for drowsiness. If not better consider Ortho referral.  2-25 Here for CPX - Td 2016 ; s/p shingrex - had a flu and covid vax   - h/o hysterectomy for benign reasons,saw gyn  had a Pap 02/01/2018 ([facility]); saw gyn 08/2022  --MMG 09-2022 ([facility]) --CCS: Cscope 11/ 2010: Polyps, diverticula,  hemorrhoids. Cscope 1-16, no polyps, next 7 years.  C-scope 02/22/2021, C-scope 01/26/2023.  Next per GI. --Tobacco abuse: Smoking mostly 1/2 ppd, at times 3/4 ppd, started at age [age].  She is essentially at 20 pack a year, we agreed to refer her to LDCT for lung cancer screening. Addendum: The lung cancer screening team spoke with the patient, she has a 17-pack-year smoking thus does not qualify for screening. Not ready to quit tobacco just yet. --Labs from December 2024: RPR negative.  CMP okay.  Hemoglobin normal.  TSH 3.3. Vitamin D  normal.  B12 more than 1500. Plan: Check FLP, A1c, folic acid  -ACP info provided  Other issues discussed today: HTN: On amlodipine , carvedilol , no ambulatory BPs, BP today is very good.  No change. Hyperlipidemia: On simvastatin .  Check FLP. Left leg edema: Sees a venous specialist regularly Osteopenia: T-score -2.1  03-2022, repeat  in 3 years.  Encouraged calcium and vitamin D . Vitamin deficiencies: Check a folic acid . Meningioma: Incidental finding after she presented with mental status changes to the ER December 2024 (MS changes felt to be due to a sedative from her dentist, name of sedative?) Request a referral to neurology .  Will arrange Neck pain: Seen at this office but another provider, to have PT. RTC 6 months

## 2023-06-19 NOTE — Patient Instructions (Signed)
 Take prednisone  as prescribed  Take the muscle relaxant called cyclobenzaprine  as needed, watch for somnolence.  Tylenol   500 mg OTC 2 tabs a day every 8 hours as needed for pain  Place a heating pad on your neck once or twice a day  If you are not better in the next 2 to 3 weeks let us  know, we will refer you to a orthopedic doctor

## 2023-06-20 NOTE — Assessment & Plan Note (Signed)
 Neck pain: Neck pain with some radiation to the right without obvious radiculopathy symptoms. Initially seen here 03-2023, was Rx Flexeril  which she took without side effects, did physical therapy and felt better.  Symptoms have returned for the last week. Neuroexam is benign.   Plan: Round of prednisone , Tylenol , heating pad, Flexeril  as needed, watch for drowsiness. If not better consider Ortho referral.

## 2023-06-29 DIAGNOSIS — G935 Compression of brain: Secondary | ICD-10-CM | POA: Diagnosis not present

## 2023-06-29 DIAGNOSIS — D496 Neoplasm of unspecified behavior of brain: Secondary | ICD-10-CM | POA: Diagnosis not present

## 2023-07-10 ENCOUNTER — Other Ambulatory Visit: Payer: Self-pay | Admitting: Internal Medicine

## 2023-07-17 DIAGNOSIS — D420 Neoplasm of uncertain behavior of cerebral meninges: Secondary | ICD-10-CM | POA: Diagnosis not present

## 2023-07-17 DIAGNOSIS — Z510 Encounter for antineoplastic radiation therapy: Secondary | ICD-10-CM | POA: Diagnosis not present

## 2023-07-17 DIAGNOSIS — D320 Benign neoplasm of cerebral meninges: Secondary | ICD-10-CM | POA: Diagnosis not present

## 2023-07-17 HISTORY — PX: OTHER SURGICAL HISTORY: SHX169

## 2023-07-29 ENCOUNTER — Telehealth: Payer: Self-pay

## 2023-07-29 DIAGNOSIS — M542 Cervicalgia: Secondary | ICD-10-CM

## 2023-07-29 NOTE — Telephone Encounter (Signed)
 Copied from CRM [id number]. Topic: Referral - Request for Referral >> Jul 29, 2023  1:28 PM Adonis Hoot wrote: Did the patient discuss referral with their provider in the last year? Yes (If No - schedule appointment) (If Yes - send message)  Appointment offered? No  Type of order/referral and detailed reason for visit: chiropractor /t11 and t12 issues that are causing her neck and shoulder pain  Preference of office, provider, location[place]  If referral order, have you been seen by this specialty before? No (If Yes, this issue or another issue? When? Where?  Can we respond through MyChart? Yes

## 2023-07-29 NOTE — Telephone Encounter (Signed)
 Spoke w/ Pt- informed of ortho referral, referral placed.   She also mentioned that she is having easy bruising on her arms again, she is not taking a baby aspirin , Pt states she mentioned it before at visit 09/2022- she mentioned that she was told by PCP if she stopped aspirin  and it came back then it was a certain diagnosis but she couldn't remember what PCP said it was.

## 2023-07-29 NOTE — Telephone Encounter (Signed)
 Benefits of aspirin  discussed 09/29/2022. Was also advised to stop it if causes bothersome easy bruising. We could discuss further on the next opportunity

## 2023-07-29 NOTE — Telephone Encounter (Signed)
 Okay to place chiropractor referral for neck pain? You saw Pt 03/26/23 and 06/19/23 regarding neck pain

## 2023-07-29 NOTE — Telephone Encounter (Signed)
 Recommend Ortho referral, please arrange.

## 2023-07-30 NOTE — Telephone Encounter (Signed)
Please arrange a office visit

## 2023-07-30 NOTE — Telephone Encounter (Signed)
 LMOM informing Pt to schedule visit at her convenience.

## 2023-07-30 NOTE — Telephone Encounter (Signed)
 Pt did stop the aspirin  but has been having bruising still.

## 2023-07-31 ENCOUNTER — Ambulatory Visit: Admitting: Internal Medicine

## 2023-07-31 ENCOUNTER — Encounter: Payer: Self-pay | Admitting: Internal Medicine

## 2023-07-31 VITALS — BP 122/66 | HR 92 | Temp 98.2°F | Resp 16 | Ht 67.0 in | Wt 156.4 lb

## 2023-07-31 DIAGNOSIS — R233 Spontaneous ecchymoses: Secondary | ICD-10-CM | POA: Diagnosis not present

## 2023-07-31 LAB — CBC WITH DIFFERENTIAL/PLATELET
Basophils Absolute: 0.1 10*3/uL (ref 0.0–0.1)
Basophils Relative: 1.4 % (ref 0.0–3.0)
Eosinophils Absolute: 0.1 10*3/uL (ref 0.0–0.7)
Eosinophils Relative: 2.4 % (ref 0.0–5.0)
HCT: 39.1 % (ref 36.0–46.0)
Hemoglobin: 13.3 g/dL (ref 12.0–15.0)
Lymphocytes Relative: 25.8 % (ref 12.0–46.0)
Lymphs Abs: 1 10*3/uL (ref 0.7–4.0)
MCHC: 34.1 g/dL (ref 30.0–36.0)
MCV: 97.5 fl (ref 78.0–100.0)
Monocytes Absolute: 0.5 10*3/uL (ref 0.1–1.0)
Monocytes Relative: 12.5 % — ABNORMAL HIGH (ref 3.0–12.0)
Neutro Abs: 2.1 10*3/uL (ref 1.4–7.7)
Neutrophils Relative %: 57.9 % (ref 43.0–77.0)
Platelets: 253 10*3/uL (ref 150.0–400.0)
RBC: 4.01 Mil/uL (ref 3.87–5.11)
RDW: 13.6 % (ref 11.5–15.5)
WBC: 3.7 10*3/uL — ABNORMAL LOW (ref 4.0–10.5)

## 2023-07-31 LAB — COMPREHENSIVE METABOLIC PANEL WITH GFR
ALT: 20 U/L (ref 0–35)
AST: 20 U/L (ref 0–37)
Albumin: 4.5 g/dL (ref 3.5–5.2)
Alkaline Phosphatase: 66 U/L (ref 39–117)
BUN: 9 mg/dL (ref 6–23)
CO2: 30 meq/L (ref 19–32)
Calcium: 9.4 mg/dL (ref 8.4–10.5)
Chloride: 105 meq/L (ref 96–112)
Creatinine, Ser: 0.59 mg/dL (ref 0.40–1.20)
GFR: 97.26 mL/min (ref >60.00)
Glucose, Bld: 81 mg/dL (ref 70–99)
Potassium: 3.3 meq/L — ABNORMAL LOW (ref 3.5–5.1)
Sodium: 144 meq/L (ref 135–145)
Total Bilirubin: 1.1 mg/dL (ref 0.2–1.2)
Total Protein: 6.9 g/dL (ref 6.0–8.3)

## 2023-07-31 LAB — PROTIME-INR
INR: 1.1 ratio — ABNORMAL HIGH (ref 0.8–1.0)
Prothrombin Time: 11.2 s (ref 9.6–13.1)

## 2023-07-31 LAB — APTT: aPTT: 27.4 s (ref 25.4–36.8)

## 2023-07-31 NOTE — Patient Instructions (Signed)
 Get your blood work  If you have any pain or discomfort: Tylenol  is okay Okay to take an ibuprofen now and then but not daily.  Avoid daily aspirin   See you in August

## 2023-07-31 NOTE — Progress Notes (Unsigned)
 Subjective:    Patient ID: Erin Fox, female    DOB: 11-19-1960, 63 y.o.   MRN: [id number]  DOS:  07/31/2023 Type of visit - description: acute  Patient is concerned about easy bruising. This is started about 09/2022 when I recommend to take aspirin  daily. Bruising is almost exclusively on her forearms. Denies any blood in the urine or blood in the stools (other than dot of blood occasionally with BMs that she thinks is from hemorrhoids. No bleeding with brushing her teeth. No nausea vomiting.  No abdominal pain.  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   Wears glasses     Past Surgical History:  Procedure Laterality Date   5th toes resected bilateral  1990's   bone's removed   ABDOMINAL HYSTERECTOMY  1984   W/ UNILATERAL SALPINGOOPHORECTOMY   ABDOMINOPLASTY  1990   BIOPSY  01/26/2023   Procedure: BIOPSY;  Surgeon: Normie Becton., MD;  Location: [facility];  Service: Gastroenterology;;   BREAST BIOPSY Right 2001   CESAREAN SECTION     COLONOSCOPY     COLONOSCOPY W/ POLYPECTOMY  last one 02-14-2014   COLONOSCOPY WITH PROPOFOL  N/A 05/23/2021   Procedure: COLONOSCOPY WITH PROPOFOL ;  Surgeon: Normie Becton., MD;  Location[facility]COPY;  Service: Gastroenterology;  Laterality: N/A;   COLONOSCOPY WITH PROPOFOL  N/A 01/26/2023   Procedure: COLONOSCOPY WITH PROPOFOL ;  Surgeon:  Mansouraty, Albino Alu., MD;  Location: [facility];  Service: Gastroenterology;  Laterality: N/A;   CYSTO/  RETROGRADE PYELOGRAM/  LEFT URETEROSCOPY/  BLADDER BIOPSY  08/27/2004   CYSTOSCOPY W/ RETROGRADES Bilateral 03/17/2014   Procedure: CYSTOSCOPY WITH RETROGRADE PYELOGRAM;  Surgeon: Osborn Blaze, MD;  Location: [facility];  Service: Urology;  Laterality: Bilateral;   CYSTOSCOPY WITH BIOPSY N/A 03/17/2014   Procedure: CYSTOSCOPY WITH BIOPSY AND FULGERATION;  Surgeon: Osborn Blaze, MD;  Location: [facility];  Service: Urology;  Laterality: N/A;   ENDOSCOPIC MUCOSAL RESECTION N/A 05/23/2021   Procedure: ENDOSCOPIC MUCOSAL RESECTION;  Surgeon: Brice Campi Albino Alu., MD;  Location: [facility] ENDOSCOPY;  Service: Gastroenterology;  Laterality: N/A;   ENDOSCOPIC MUCOSAL RESECTION  01/26/2023   Procedure: ENDOSCOPIC MUCOSAL RESECTION;  Surgeon: Brice Campi Albino Alu., MD;  Location: [facility]OSCOPY;  Service: Gastroenterology;;   HEMOSTASIS CLIP PLACEMENT  05/23/2021   Procedure: HEMOSTASIS CLIP PLACEMENT;  Surgeon: Normie Becton., MD;  Location: [facility] ENDOSCOPY;  Service: Gastroenterology;;   HEMOSTASIS CONTROL  01/26/2023   Procedure: HEMOSTASIS CONTROL;  Surgeon: Normie Becton., MD;  Location: [facility];  Service: Gastroenterology;;   OTHER SURGICAL HISTORY Left 2016   Cyst Removal on Left ear   POLYPECTOMY     POLYPECTOMY  05/23/2021   Procedure: POLYPECTOMY;  Surgeon: Normie Becton., MD;  Location: [facility] ENDOSCOPY;  Service: Gastroenterology;;  POLYPECTOMY  01/26/2023   Procedure: POLYPECTOMY;  Surgeon: Mansouraty, Albino Alu., MD;  Location: [facility];  Service: Gastroenterology;;   REMOVAL BARTHOLIN CYST/ GLAND AND REVISION LEFT LABIA  06/10/2004   SUBMUCOSAL LIFTING INJECTION  05/23/2021   Procedure: SUBMUCOSAL LIFTING INJECTION;  Surgeon: Normie Becton., MD;  Location: [facility] ENDOSCOPY;  Service: Gastroenterology;;    SUBMUCOSAL LIFTING INJECTION  01/26/2023   Procedure: SUBMUCOSAL LIFTING INJECTION;  Surgeon: Normie Becton., MD;  Location: [facility]OS[facility];  Service: Gastroenterology;;   SUBMUCOSAL TATTOO INJECTION  01/26/2023   Procedure: SUBMUCOSAL TATTOO INJECTION;  Surgeon: Normie Becton., MD;  Location: [facility];  Service: Gastroenterology;;    Current Outpatient Medications  Medication Instructions   acyclovir  (ZOVIRAX ) 400 mg, Oral, 2 times daily PRN   albuterol  (VENTOLIN  HFA) 108 (90 Base) MCG/ACT inhaler 2 puffs, Every 6 hours PRN   amLODipine  (NORVASC ) 5 mg, Oral, Daily   B-12 3,000 mcg, Daily   budesonide -formoterol  (SYMBICORT ) 160-4.5 MCG/ACT inhaler 2 puffs, Inhalation, 2 times daily   carvedilol  (COREG ) 12.5 mg, Oral, 2 times daily with meals   cyclobenzaprine  (FLEXERIL ) 5 mg, Oral, 3 times daily PRN   D3 5000 5,000 Units, Daily   fluticasone  (FLONASE ) 50 MCG/ACT nasal spray 2 sprays, Each Nare, Daily   folic acid  (FOLVITE ) 5 mg, Oral, Daily   predniSONE  (DELTASONE ) 10 MG tablet 4 tablets x 2 days, 3 tabs x 2 days, 2 tabs x 2 days, 1 tab x 2 days   PREVIDENT 5000 BOOSTER PLUS 1.1 % PSTE 1 application , Daily   simvastatin  (ZOCOR ) 20 mg, Oral, Daily at bedtime       Objective:   Physical Exam BP 122/66   Pulse 92   Temp 98.2 F (36.8 C) (Oral)   Resp 16   Ht 5' 7 (1.702 m)   Wt 156 lb 6 oz (70.9 kg)   BMI 24.49 kg/m  General:   Well developed, NAD, BMI noted.  HEENT:  Normocephalic . Face symmetric, atraumatic   Abdomen:  Not distended, soft, non-tender. No rebound or rigidity.  No obvious organomegaly Skin:  See picture, has some soft, not raised, not tender bruises at the forearms She does have a couple of more noticeable bruises at the lower extremities but they were clearly related to bumping her legs on a piece of furniture at work Lower extremities: no pretibial edema bilaterally  Neurologic:  alert & oriented X3.  Speech normal, gait  appropriate for age and unassisted Psych--  Cognition and judgment appear intact.  Cooperative with normal attention span and concentration.  Behavior appropriate. No anxious or depressed appearing.     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 Hemorrhoids- banding x 3 2020 IBS? Sees dermatology regularly Mild folic acid  deficiency (labs 01/2020). B12  dx  Def 2024 Osteopenia: -2.1 (2024). Meningioma: incidental per ER CT  01/28/2023   GU: Over active bladder, s/p cysto   >1, 2011, 2016, etc, see urology notes  H/o hematuria- s/p extensive w/u ~ 2016 Non-obstructive nephrolithiasis  PLAN Easy bruising: Symptoms started around August 2024 when I recommended aspirin  daily, she has stopped aspirin  and restarted 4 weeks ago however bruising has no change. Primarily located on the forearms.  No major bleedings, had hysterectomy without abnormal bleeding postop. For completeness, will get a CMP, CBC PT and PTT otherwise recommend observation. The forearms  are notable for occasionally get bumped without the patient noticing it. Also for now recommend to avoid aspirin  and minimize the use of NSAIDs. Left-sided meningioma: Saw the specialist at [facility], neurosurgery and radiation therapy. S?P gamma knife stereotactic radiosurgery June 6   RTC as scheduled for August 2025 Neck pain: Neck pain with some radiation to the right without obvious radiculopathy symptoms. Initially seen here 03-2023, was Rx Flexeril  which she took without side effects, did physical therapy and felt better.  Symptoms have returned for the last week. Neuroexam is benign.   Plan: Round of prednisone , Tylenol , heating pad, Flexeril  as needed, watch for drowsiness. If not better consider Ortho referral.

## 2023-08-02 ENCOUNTER — Ambulatory Visit: Payer: Self-pay | Admitting: Internal Medicine

## 2023-08-02 NOTE — Assessment & Plan Note (Signed)
 Easy bruising: Symptoms started around August 2024 when I recommended aspirin  daily, she has stopped aspirin  and restarted 4 weeks ago however bruising has no change. Primarily located on the forearms.  No major bleedings, had hysterectomy without abnormal bleeding postop. For completeness, will get a CMP, CBC PT and PTT otherwise recommend observation. The forearms are notable for occasionally get bumped without the patient noticing it. Also for now recommend to avoid aspirin  and minimize the use of NSAIDs. Left-sided meningioma: Saw the specialist at [facility], neurosurgery and radiation therapy. S/p gamma knife stereotactic radiosurgery June 6  RTC as scheduled for August 2025

## 2023-08-19 ENCOUNTER — Other Ambulatory Visit: Payer: Self-pay | Admitting: Internal Medicine

## 2023-08-19 DIAGNOSIS — Z1231 Encounter for screening mammogram for malignant neoplasm of breast: Secondary | ICD-10-CM

## 2023-09-02 ENCOUNTER — Other Ambulatory Visit (INDEPENDENT_AMBULATORY_CARE_PROVIDER_SITE_OTHER): Payer: Self-pay

## 2023-09-02 ENCOUNTER — Ambulatory Visit (INDEPENDENT_AMBULATORY_CARE_PROVIDER_SITE_OTHER): Admitting: Orthopedic Surgery

## 2023-09-02 DIAGNOSIS — M5412 Radiculopathy, cervical region: Secondary | ICD-10-CM

## 2023-09-02 DIAGNOSIS — M79641 Pain in right hand: Secondary | ICD-10-CM | POA: Diagnosis not present

## 2023-09-02 DIAGNOSIS — Z720 Tobacco use: Secondary | ICD-10-CM | POA: Diagnosis not present

## 2023-09-02 DIAGNOSIS — M25531 Pain in right wrist: Secondary | ICD-10-CM | POA: Diagnosis not present

## 2023-09-02 MED ORDER — PREGABALIN 75 MG PO CAPS: 75.0000 mg | ORAL_CAPSULE | Freq: Two times a day (BID) | ORAL | 0 refills | Status: AC

## 2023-09-02 NOTE — Progress Notes (Signed)
 Orthopedic Spine Surgery Office Note  Assessment: Patient is a 63 y.o. female with tow issues:  1) nondisplaced right distal radius fracture 2) neck pain that radiates into the right trapezius, possible radiculopathy   Plan: -Explained that initially conservative treatment is tried as a significant number of patients may experience relief with these treatment modalities. Discussed that the conservative treatments include:  -activity modification  -physical therapy  -over the counter pain medications  -medrol  dosepak  -cervical steroid injections -Patient has tried PT, muscle relaxers, oral steroids with Dr. Amon for 2 months now (first started treatment with flexeril  on 06/19/2023) without any relief of her pain, so recommended an MRI of the cervical spine to evaluate for radiculopathy - For her distal radius fracture, recommended a removable wrist brace that she wears at all times except when showering.  Should be nonweightbearing on the right upper extremity -Prescribed Lyrica  for additional pain relief of her possible radiculopathy -Would need to be nicotine free prior to elective spine surgery -Patient should return to office in 4 weeks, x-rays at next visit: none   I spent covering the health risks associated with smoking. From a surgeon's perspective, I told her that nicotine increases the risk of infection, pseudarthrosis, and wound healing issues. I encouraged cessation. Due to the increased risks, I explained that I make patient's quit all nicotine prior to elective spine surgeries.   ___________________________________________________________________________   History:  Patient is a 63 y.o. female who presents today for cervical spine.  Patient was initially come to the office for neck pain that radiated into the right shoulder.  She has felt that since October 2024.  She feels the pain starts in her neck and goes into the trapezius on the right side.  It does not radiate  past the shoulder.  She has no pain radiating to the left upper extremity.  There is no trauma or injury that preceded the onset of the pain.  She then had a fall yesterday and noted acute onset of right wrist and hand pain.  She also wanted to be seen for that.  She said this was a fall from ground-level height.  She has noticed swelling over the dorsal aspect of the hand and ecchymosis.  She has had difficulty using the hand as a result of the pain.  She did not injure anything else in the fall   Weakness: Denies Difficulty with fine motor skills (e.g., buttoning shirts, handwriting): Denies Symptoms of imbalance: Denies Paresthesias and numbness: Denies Bowel or bladder incontinence: Denies Saddle anesthesia: Denies  Treatments tried: PT, Flexeril , oral steroids  Review of systems: Denies fevers and chills, night sweats, unexplained weight loss, history of cancer, pain that wakes her at night  Past medical history: Anxiety HSV HLD HTN [place] Overactive bladder  Allergies: NKDA  Past surgical history:  5th toe surgery C section Hysterectomy Abdominoplasty Polypectomy  Social history: Reports use of nicotine product (smoking, vaping, patches, smokeless) Alcohol use: Rare Denies recreational drug use   Physical Exam:  BMI of 24.6  General: no acute distress, appears stated age Neurologic: alert, answering questions appropriately, following commands Respiratory: unlabored breathing on room air, symmetric chest rise Psychiatric: appropriate affect, normal cadence to speech   MSK (spine):  -Strength exam      Left  Right Grip strength                5/5  3/5 Interosseus   5/5   3/5 Wrist extension  5/5  3/5  Wrist flexion   5/5  3/5 Elbow flexion   5/5  5/5 Deltoid    5/5  5/5  Right hand wrist and finger strength testing limited by pain from her recent injury  -Sensory exam    Sensation intact to light touch in C5-T1 nerve distributions of bilateral  upper extremities  -Brachioradialis DTR: 2/4 on the left, 2/4 on the right -Biceps DTR: 2/4 on the left, 2/4 on the right  -Spurling: Negative bilaterally -Hoffman sign: Negative bilaterally -Clonus: No beats bilaterally -Interosseous wasting: None seen -Grip and release test: Negative -Romberg: Negative -Gait: Normal  Left shoulder exam: No pain through range of motion Right shoulder exam: No pain to range of motion, negative Jobe, negative belly press, no weakness with external rotation with arm at side  Right hand exam: TTP over the distal radius and the dorsal carpus, ecchymosis seen over the dorsal aspect of the distal arm and over the carpus, pain with flexion extension at the wrist, AIN/PIN/IO intact, sensation intact to light touch in median/radial/ulnar nerve distributions, palpable radial pulse, hand warm and well-perfused  Imaging: XRs of the cervical spine from 09/02/2023 were independently reviewed and interpreted, showing anterior osteophyte formation at C4/5.  Disc height loss and anterior osteophyte formation seen at C5/6.  No evidence of instability on flexion/extension views.  No fracture or dislocation seen.  XRs of the right hand from 09/02/2023 were independently reviewed and interpreted, showing subtle lucency over the distal radius and there appears to be extension into the intra-articular space best seen on the AP view.  There is also lucency seen on the oblique view.  No fracture seen on the lateral view.  Fracture is nondisplaced.  No other fracture seen.  No dislocation seen.   Patient name: Erin Fox Patient MRN: [id number] Date of visit: 09/02/23

## 2023-09-11 ENCOUNTER — Other Ambulatory Visit: Payer: Self-pay | Admitting: Internal Medicine

## 2023-09-13 ENCOUNTER — Ambulatory Visit (HOSPITAL_BASED_OUTPATIENT_CLINIC_OR_DEPARTMENT_OTHER)

## 2023-09-16 ENCOUNTER — Ambulatory Visit (HOSPITAL_BASED_OUTPATIENT_CLINIC_OR_DEPARTMENT_OTHER)

## 2023-09-17 ENCOUNTER — Ambulatory Visit
Admission: RE | Admit: 2023-09-17 | Discharge: 2023-09-17 | Disposition: A | Discharge status: Home / Self Care | Source: Ambulatory Visit | Attending: Internal Medicine | Admitting: Internal Medicine

## 2023-09-17 DIAGNOSIS — Z1231 Encounter for screening mammogram for malignant neoplasm of breast: Secondary | ICD-10-CM | POA: Diagnosis not present

## 2023-09-23 ENCOUNTER — Encounter (HOSPITAL_BASED_OUTPATIENT_CLINIC_OR_DEPARTMENT_OTHER): Payer: Self-pay | Admitting: Emergency Medicine

## 2023-09-23 ENCOUNTER — Emergency Department (HOSPITAL_BASED_OUTPATIENT_CLINIC_OR_DEPARTMENT_OTHER)
Admission: EM | Admit: 2023-09-23 | Discharge: 2023-09-23 | Disposition: A | Discharge status: Home / Self Care | Attending: Emergency Medicine | Admitting: Emergency Medicine

## 2023-09-23 DIAGNOSIS — G5712 Meralgia paresthetica, left lower limb: Secondary | ICD-10-CM | POA: Insufficient documentation

## 2023-09-23 DIAGNOSIS — Z79899 Other long term (current) drug therapy: Secondary | ICD-10-CM | POA: Insufficient documentation

## 2023-09-23 DIAGNOSIS — M545 Low back pain, unspecified: Secondary | ICD-10-CM | POA: Diagnosis not present

## 2023-09-23 MED ORDER — ACETAMINOPHEN 500 MG PO TABS
1000.0000 mg | ORAL_TABLET | Freq: Once | ORAL | Status: AC
  Administered 2023-09-23 (×2): 1000 mg via ORAL
  Filled 2023-09-23: qty 2

## 2023-09-23 MED ORDER — KETOROLAC TROMETHAMINE 15 MG/ML IJ SOLN
15.0000 mg | Freq: Once | INTRAMUSCULAR | Status: AC
  Administered 2023-09-23 (×2): 15 mg via INTRAMUSCULAR
  Filled 2023-09-23: qty 1

## 2023-09-23 MED ORDER — OXYCODONE HCL 5 MG PO TABS
5.0000 mg | ORAL_TABLET | Freq: Once | ORAL | Status: AC
  Administered 2023-09-23 (×2): 5 mg via ORAL
  Filled 2023-09-23: qty 1

## 2023-09-23 MED ORDER — METHOCARBAMOL 500 MG PO TABS: 500.0000 mg | ORAL_TABLET | Freq: Two times a day (BID) | ORAL | 0 refills | Status: DC

## 2023-09-23 MED ORDER — DIAZEPAM 5 MG PO TABS
5.0000 mg | ORAL_TABLET | Freq: Once | ORAL | Status: AC
  Administered 2023-09-23 (×2): 5 mg via ORAL
  Filled 2023-09-23: qty 1

## 2023-09-23 NOTE — Discharge Instructions (Addendum)
 Your back pain is most likely due to a muscular strain.  There is been a lot of research on back pain, unfortunately the only thing that seems to really help is Tylenol  and ibuprofen.  Relative rest is also important to not lift greater than 10 pounds bending or twisting at the waist.  Please follow-up with your family physician.  The other thing that really seems to benefit patients is physical therapy which your doctor may send you for.  Please return to the emergency department for new numbness or weakness to your arms or legs. Difficulty with urinating or urinating or pooping on yourself.  Also if you cannot feel toilet paper when you wipe or get a fever.   Take 4 over the counter ibuprofen tablets 3 times a day or 2 over-the-counter naproxen tablets twice a day for pain. Also take tylenol  1000mg (2 extra strength) four times a day.   Stretches can also help, try [email or web address]

## 2023-09-23 NOTE — ED Provider Notes (Signed)
 [place] [facility] Provider Note   CSN: [id number] Arrival date & time: 09/23/23  [id number]     Patient presents with: Leg Injury   Erin Fox is a 63 y.o. female.   63 yo F with a chief complaint of left-sided low back pain that radiates down the leg.  This started yesterday.  Seems to be worse with standing twisting lying down.  Does help to have a pillow between her legs.  She has noticed that she feels some numbness to the anterior aspect of her left thigh.  She denies trauma denies loss of bowel or bladder denies loss of perirectal sensation.  She was having trouble with her neck and had seen ortho but the seem to have resolved.        Prior to Admission medications   Medication Sig Start Date End Date Taking? Authorizing Provider  methocarbamol  (ROBAXIN ) 500 MG tablet Take 1 tablet (500 mg total) by mouth 2 (two) times daily. 09/23/23  Yes Emil Share, DO  acyclovir  (ZOVIRAX ) 400 MG tablet Take 1 tablet (400 mg total) by mouth 2 (two) times daily as needed. 05/22/21   Amon Aloysius BRAVO, MD  amLODipine  (NORVASC ) 5 MG tablet Take 1 tablet (5 mg total) by mouth daily. 06/04/23   Paz, Jose E, MD  budesonide -formoterol  (SYMBICORT ) 160-4.5 MCG/ACT inhaler Inhale 2 puffs into the lungs 2 (two) times daily. 05/05/23   Paz, Jose E, MD  carvedilol  (COREG ) 12.5 MG tablet Take 1 tablet (12.5 mg total) by mouth 2 (two) times daily with a meal. 07/10/23   Amon Aloysius BRAVO, MD  Cholecalciferol (D3 5000) 125 MCG (5000 UT) capsule Take 5,000 Units by mouth daily.    [provider]  Cyanocobalamin  (B-12) 3000 MCG CAPS Take 3,000 mcg by mouth daily.    [provider]  cyclobenzaprine  (FLEXERIL ) 5 MG tablet Take 1 tablet (5 mg total) by mouth 3 (three) times daily as needed for muscle spasms. 06/19/23   Paz, Jose E, MD  fluticasone  (FLONASE ) 50 MCG/ACT nasal spray Place 2 sprays into both nostrils daily. 05/26/23   Amon Aloysius BRAVO, MD  folic acid  (FOLVITE ) 1  MG tablet TAKE 5 TABLETS BY MOUTH ONCE DAILY 09/11/23   Paz, Jose E, MD  predniSONE  (DELTASONE ) 10 MG tablet 4 tablets x 2 days, 3 tabs x 2 days, 2 tabs x 2 days, 1 tab x 2 days Patient not taking: Reported on 07/31/2023 06/19/23   Paz, Jose E, MD  pregabalin  (LYRICA ) 75 MG capsule Take 1 capsule (75 mg total) by mouth 2 (two) times daily. 09/02/23   Georgina Ozell LABOR, MD  PREVIDENT 5000 BOOSTER PLUS 1.1 % PSTE Place 1 application. onto teeth daily. Patient not taking: Reported on 07/31/2023 04/21/20   [provider]  simvastatin  (ZOCOR ) 20 MG tablet Take 1 tablet (20 mg total) by mouth at bedtime. 04/16/23   Paz, Jose E, MD    Allergies: Patient has no known allergies.    Review of Systems  Updated Vital Signs BP (!) 158/79 (BP Location: Left Arm)   Pulse (!) 53   Temp 98.4 F (36.9 C) (Oral)   Resp 20   Ht 5' 6.5 (1.689 m)   Wt 71.2 kg   SpO2 98%   BMI 24.96 kg/m   Physical Exam Vitals and nursing note reviewed.  Constitutional:      General: She is not in acute distress.    Appearance: She is well-developed. She is  not diaphoretic.  HENT:     Head: Normocephalic and atraumatic.  Eyes:     Pupils: Pupils are equal, round, and reactive to light.  Cardiovascular:     Rate and Rhythm: Normal rate and regular rhythm.     Heart sounds: No murmur heard.    No friction rub. No gallop.  Pulmonary:     Effort: Pulmonary effort is normal.     Breath sounds: No wheezing or rales.  Abdominal:     General: There is no distension.     Palpations: Abdomen is soft.     Tenderness: There is no abdominal tenderness.  Musculoskeletal:        General: No tenderness.     Cervical back: Normal range of motion and neck supple.     Comments: Pulse motor and sensation intact to the left lower extremity.  Reflexes 1+ and equal to the other side.  No clonus.  No obvious pain about the piriformis.  No obvious midline spinal tenderness step-offs or deformities.  Skin:    General: Skin is warm  and dry.  Neurological:     Mental Status: She is alert and oriented to person, place, and time.  Psychiatric:        Behavior: Behavior normal.     (all labs ordered are listed, but only abnormal results are displayed) Labs Reviewed - No data to display  EKG: None  Radiology: No results found.   Procedures   Medications Ordered in the ED  acetaminophen  (TYLENOL ) tablet 1,000 mg (has no administration in time range)  ketorolac  (TORADOL ) 15 MG/ML injection 15 mg (has no administration in time range)  oxyCODONE  (Oxy IR/ROXICODONE ) immediate release tablet 5 mg (has no administration in time range)  diazepam  (VALIUM ) tablet 5 mg (has no administration in time range)                                    Medical Decision Making Risk OTC drugs. Prescription drug management.   63 yo F with a chief complaint of left-sided low back pain that radiates in the leg.  This has been going on for about a day.  Atraumatic.  Benign exam.  By history the patient has meralgia paresthetica.  Discussed symptomatic therapy with her.  Will have her follow-up with her PCP in the office.  5:34 AM:  I have discussed the diagnosis/risks/treatment options with the patient.  Evaluation and diagnostic testing in the emergency department does not suggest an emergent condition requiring admission or immediate intervention beyond what has been performed at this time.  They will follow up with PCP. We also discussed returning to the ED immediately if new or worsening sx occur. We discussed the sx which are most concerning (e.g., sudden worsening pain, fever, inability to tolerate by mouth, cauda equina s/sx) that necessitate immediate return. Medications administered to the patient during their visit and any new prescriptions provided to the patient are listed below.  Medications given during this visit Medications  acetaminophen  (TYLENOL ) tablet 1,000 mg (has no administration in time range)  ketorolac   (TORADOL ) 15 MG/ML injection 15 mg (has no administration in time range)  oxyCODONE  (Oxy IR/ROXICODONE ) immediate release tablet 5 mg (has no administration in time range)  diazepam  (VALIUM ) tablet 5 mg (has no administration in time range)     The patient appears reasonably screen and/or stabilized for discharge and I doubt any other medical  condition or other [facility] requiring further screening, evaluation, or treatment in the ED at this time prior to discharge.       Final diagnoses:  Meralgia paresthetica of left side    ED Discharge Orders          Ordered    methocarbamol  (ROBAXIN ) 500 MG tablet  2 times daily        09/23/23 0531               Emil Share, DO 09/23/23 [phone]

## 2023-09-23 NOTE — ED Triage Notes (Signed)
 Per EMS, called for back pain. Since 7 pm has progressively gotten worse. Left side. Per pt also has numbness on left thigh.

## 2023-09-28 ENCOUNTER — Ambulatory Visit: Payer: BC Managed Care – PPO | Admitting: Internal Medicine

## 2023-10-01 ENCOUNTER — Ambulatory Visit: Admitting: Orthopedic Surgery

## 2023-10-01 DIAGNOSIS — M5416 Radiculopathy, lumbar region: Secondary | ICD-10-CM

## 2023-10-01 MED ORDER — METHYLPREDNISOLONE 4 MG PO TBPK
ORAL_TABLET | ORAL | 0 refills | Status: DC
Start: 2023-10-01 — End: 2023-11-12

## 2023-10-01 NOTE — Progress Notes (Signed)
 Orthopedic Spine Surgery Office Note   Assessment: Patient is a 63 y.o. female with two issues:   1) nondisplaced right distal radius fracture - healing well 2) low back pain and numbness in the left anterior thigh which has been improving with time     Plan: -Neck pain radiating into the right trapezius is much better, so no further intervention recommended at this time - For her distal radius fracture, should continue to use the removable wrist brace for an additional 4 weeks. She can then transition out of it -In regards to her back, prescribed a medrol  dose pak. Told her not to take NSAIDs when using the prednisone . Can continue to use the tylenol .  -If she is not doing any better at our next visit, will order a MRI of the lumbar spine to evaluate for radiculopathy -Would need to be nicotine free prior to elective spine surgery -Patient should return to office in 6 weeks, if she is still having pain in her lumbar region would get 4V lumbar. Would also get AP/lateral right wrist   ___________________________________________________________________________     History:   Patient is a 63 y.o. female who presents today for follow up on her right wrist and to talk about her radiating left leg pain. Patient states that her neck and right trapezius pain is much better. It is currently tolerable and she is not using any medication for that pain. Her right wrist has also been doing better. She is not having any pain in the wrist at this time. She continues to use the brace.  In the last 2 weeks, she noted onset of left posterior hip pain that radiated into the left lateral thigh and leg to the level of the ankle. She also developed numbness in the left anterior thigh. There was no trauma or injury that preceded the onset of the pain. The pain has gotten much better over the last couple of days. She is not having any pain into the extremity but still has posterior hip pain which she notes in the  morning. She also still has the numbness in the left anterior thigh. No other numbness or paresthesias. No lower extremity weakness. No saddle anesthesia. No bowel or bladder incontinence.     Treatments tried: PT, tylenol , flexeril , oral steroids    Physical Exam:   General: no acute distress, appears stated age Neurologic: alert, answering questions appropriately, following commands Respiratory: unlabored breathing on room air, symmetric chest rise Psychiatric: appropriate affect, normal cadence to speech     MSK (spine):   -Strength exam                                                   Left                  Right Grip strength                5/5                  4/5 Interosseus                  5/5                  4/5 Wrist extension            5/5  4/5 Wrist flexion                 5/5                  4/5 Elbow flexion                5/5                  5/5 Deltoid                          5/5                  5/5       Left  Right EHL    5/5  5/5 TA    5/5  5/5 GSC    5/5  5/5 Knee extension  5/5  5/5 Hip flexion   5/5  5/5  Right hand wrist and finger strength testing limited by pain from her recent injury   -Sensory exam                            Sensation intact to light touch in C5-T1 nerve distributions of bilateral upper extremities  Sensation intact to light touch in L3-S1 nerve distributions bilaterally (decreased in L3 distribution on the left)    -Negative SLR bilaterally -No beats of clonus bilaterally   Right hand exam: minimal TTP over the distal radius and the dorsal carpus, no ecchymosis or swelling seen, AIN/PIN/IO intact, sensation intact to light touch in median/radial/ulnar nerve distributions, palpable radial pulse, hand warm and well-perfused   Imaging: XRs of the cervical spine from 09/02/2023 were previously independently reviewed and interpreted, showing anterior osteophyte formation at C4/5.  Disc height loss and anterior  osteophyte formation seen at C5/6.  No evidence of instability on flexion/extension views.  No fracture or dislocation seen.   XRs of the right hand from 09/02/2023 were previously independently reviewed and interpreted, showing subtle lucency over the distal radius and there appears to be extension into the intra-articular space best seen on the AP view.  There is also lucency seen on the oblique view.  No fracture seen on the lateral view.  Fracture is nondisplaced.  No other fracture seen.  No dislocation seen.     Patient name: Erin Fox Patient MRN: [id number] Date of visit: 10/01/23

## 2023-10-05 ENCOUNTER — Encounter: Payer: Self-pay | Admitting: Internal Medicine

## 2023-10-05 ENCOUNTER — Other Ambulatory Visit: Payer: Self-pay | Admitting: Internal Medicine

## 2023-10-05 ENCOUNTER — Ambulatory Visit (INDEPENDENT_AMBULATORY_CARE_PROVIDER_SITE_OTHER): Admitting: Internal Medicine

## 2023-10-05 VITALS — BP 132/88 | HR 50 | Temp 98.1°F | Resp 12 | Ht 66.5 in | Wt 154.2 lb

## 2023-10-05 DIAGNOSIS — R233 Spontaneous ecchymoses: Secondary | ICD-10-CM

## 2023-10-05 DIAGNOSIS — I10 Essential (primary) hypertension: Secondary | ICD-10-CM

## 2023-10-05 LAB — BASIC METABOLIC PANEL WITH GFR
BUN: 12 mg/dL (ref 6–23)
CO2: 33 meq/L — ABNORMAL HIGH (ref 19–32)
Calcium: 9.2 mg/dL (ref 8.4–10.5)
Chloride: 102 meq/L (ref 96–112)
Creatinine, Ser: 0.6 mg/dL (ref 0.40–1.20)
GFR: 96.75 mL/min (ref >60.00)
Glucose, Bld: 91 mg/dL (ref 70–99)
Potassium: 3.9 meq/L (ref 3.5–5.1)
Sodium: 144 meq/L (ref 135–145)

## 2023-10-05 NOTE — Progress Notes (Unsigned)
 Subjective:    Patient ID: Erin Fox, female    DOB: 06-Feb-1961, 63 y.o.   MRN: [id number]  DOS:  10/05/2023 Type of visit - description: follow up  Chronic medical problems addressed. Had a fall in July, injured her right hand.  Has seen orthopedics. Last potassium was slightly low, on OTCs.   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   Wears glasses     Past Surgical History:  Procedure Laterality Date   5th toes resected bilateral  1990's   bone's removed   ABDOMINAL HYSTERECTOMY  1984   W/ UNILATERAL SALPINGOOPHORECTOMY   ABDOMINOPLASTY  1990   BIOPSY  01/26/2023   Procedure: BIOPSY;  Surgeon: Wilhelmenia Aloha Raddle., MD;  Location: [facility];  Service: Gastroenterology;;   BREAST BIOPSY Right 2001   CESAREAN SECTION     COLONOSCOPY     COLONOSCOPY W/ POLYPECTOMY  last one 02-14-2014   COLONOSCOPY WITH PROPOFOL  N/A 05/23/2021   Procedure: COLONOSCOPY WITH PROPOFOL ;  Surgeon: Wilhelmenia Aloha Raddle., MD;  Location: [facility];  Service: Gastroenterology;  Laterality: N/A;   COLONOSCOPY WITH PROPOFOL  N/A 01/26/2023   Procedure: COLONOSCOPY WITH PROPOFOL ;  Surgeon: Mansouraty, Aloha Raddle., MD;  Location: [facility];  Service: Gastroenterology;  Laterality: N/A;   CYSTO/  RETROGRADE PYELOGRAM/  LEFT URETEROSCOPY/  BLADDER BIOPSY  08/27/2004   CYSTOSCOPY W/ RETROGRADES Bilateral  03/17/2014   Procedure: CYSTOSCOPY WITH RETROGRADE PYELOGRAM;  Surgeon: Ricardo Likens, MD;  Location: [facility];  Service: Urology;  Laterality: Bilateral;   CYSTOSCOPY WITH BIOPSY N/A 03/17/2014   Procedure: CYSTOSCOPY WITH BIOPSY AND FULGERATION;  Surgeon: Ricardo Likens, MD;  Location: [facility];  Service: Urology;  Laterality: N/A;   ENDOSCOPIC MUCOSAL RESECTION N/A 05/23/2021   Procedure: ENDOSCOPIC MUCOSAL RESECTION;  Surgeon: Wilhelmenia Aloha Raddle., MD;  Location: [facility] END[facility]COPY;  Service: Gastroenterology;  Laterality: N/A;   ENDOSCOPIC MUCOSAL RESECTION  01/26/2023   Procedure: ENDOSCOPIC MUCOSAL RESECTION;  Surgeon: Wilhelmenia Aloha Raddle., MD;  Location: [facility];  Service: Gastroenterology;;   HEMOSTASIS CLIP PLACEMENT  05/23/2021   Procedure: HEMOSTASIS CLIP PLACEMENT;  Surgeon: Wilhelmenia Aloha Raddle., MD;  Location: [facility];  Service: Gastroenterology;;   HEMOSTASIS CONTROL  01/26/2023   Procedure: HEMOSTASIS CONTROL;  Surgeon: Wilhelmenia Aloha Raddle., MD;  Location: [facility];  Service: Gastroenterology;;   OTHER SURGICAL HISTORY Left 2016   Cyst Removal on Left ear   POLYPECTOMY     POLYPECTOMY  05/23/2021   Procedure: POLYPECTOMY;  Surgeon: Wilhelmenia Aloha Raddle., MD;  Location: [facility] ENDOSCOPY;  Service: Gastroenterology;;   POLYPECTOMY  01/26/2023   Procedure: POLYPECTOMY;  Surgeon: Wilhelmenia Aloha Raddle., MD;  Location: [facility];  Service: Gastroenterology;;   REMOVAL BARTHOLIN CYST/ GLAND AND REVISION LEFT LABIA  06/10/2004   SUBMUCOSAL  LIFTING INJECTION  05/23/2021   Procedure: SUBMUCOSAL LIFTING INJECTION;  Surgeon: Wilhelmenia Aloha Raddle., MD;  Location: [facility] ENDOSCOPY;  Service: Gastroenterology;;   SUBMUCOSAL LIFTING INJECTION  01/26/2023   Procedure: SUBMUCOSAL LIFTING INJECTION;  Surgeon: Wilhelmenia Aloha Raddle., MD;  Location: [facility];  Service: Gastroenterology;;   SUBMUCOSAL TATTOO INJECTION  01/26/2023    Procedure: SUBMUCOSAL TATTOO INJECTION;  Surgeon: Wilhelmenia Aloha Raddle., MD;  Location: [facility];  Service: Gastroenterology;;    Current Outpatient Medications  Medication Instructions   acyclovir  (ZOVIRAX ) 400 mg, Oral, 2 times daily PRN   amLODipine  (NORVASC ) 5 mg, Oral, Daily   B-12 3,000 mcg, Daily   budesonide -formoterol  (SYMBICORT ) 160-4.5 MCG/ACT inhaler 2 puffs, Inhalation, 2 times daily   carvedilol  (COREG ) 12.5 mg, Oral, 2 times daily with meals   cyclobenzaprine  (FLEXERIL ) 5 mg, Oral, 3 times daily PRN   D3 5000 5,000 Units, Daily   fluticasone  (FLONASE ) 50 MCG/ACT nasal spray 2 sprays, Each Nare, Daily   folic acid  (FOLVITE ) 1 MG tablet TAKE 5 TABLETS BY MOUTH ONCE DAILY   methylPREDNISolone  (MEDROL  DOSEPAK) 4 MG TBPK tablet Take as prescribed on the box   pregabalin  (LYRICA ) 75 mg, Oral, 2 times daily   PREVIDENT 5000 BOOSTER PLUS 1.1 % PSTE 1 application , Daily   simvastatin  (ZOCOR ) 20 mg, Oral, Daily at bedtime       Objective:   Physical Exam BP 132/88 (BP Location: Left Arm, Patient Position: Sitting, Cuff Size: Normal)   Pulse (!) 50   Temp 98.1 F (36.7 C) (Oral)   Resp 12   Ht 5' 6.5 (1.689 m)   Wt 154 lb 3.2 oz (69.9 kg)   SpO2 98%   BMI 24.52 kg/m  General:   Well developed, NAD, BMI noted. HEENT:  Normocephalic . Face symmetric, atraumatic Lungs:  CTA B Normal respiratory effort, no intercostal retractions, no accessory muscle use. Heart: RRR,  no murmur.  Lower extremities: no pretibial edema bilaterally Right arm: Has a wrist brace Skin: Has a bruise on the right pretibial area Neurologic:  alert & oriented X3.  Speech normal, gait appropriate for age and unassisted Psych--  Cognition and judgment appear intact.  Cooperative with normal attention span and concentration.  Behavior appropriate. No anxious or depressed appearing.      Assessment    Assessment HTN: Onset 07-2016 Anxiety, insomnia Hyperlipidemia Reacive airway DZ  dx 2019 HSV L leg edema,Venous insufficiency, since the 80s after she had her child. Hemorrhoids- banding x 3 2020 IBS? Sees dermatology regularly Vit DEF: Mild folic acid  deficiency (labs 01/2020). B12  dx  Def 2024 Osteopenia: -2.1 (2024). Meningioma: incidental per ER CT  01/28/2023  . S/p gamma knife radiosurgery July 17 2023 GU: Over active bladder, s/p cysto   >1, 2011, 2016, etc, see urology notes  H/o hematuria- s/p extensive w/u ~ 2016 Non-obstructive nephrolithiasis  PLAN HTN,Diastolic BP slightly elevated, recommend to monitor at home.  Continue amlodipine , carvedilol , last potassium is slightly low, recheck today.  Easy bruising: See LOV, labs essentially normal.  Today she reports had difficulty with easy bruising of her life, has had multiple surgeries without any abnormal bleeding.  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 Primarily located  on the forearms.  No major bleedings, had hysterectomy without abnormal bleeding postop. For completeness, will get a CMP, CBC PT and PTT otherwise recommend observation. The forearms are notable for occasionally get bumped without the patient noticing it. Also for now recommend to avoid aspirin  and minimize the use of NSAIDs. Left-sided meningioma: Saw the specialist at [facility], neurosurgery and radiation therapy. S/p gamma knife stereotactic radiosurgery June 6  RTC as scheduled for August 2025

## 2023-10-05 NOTE — Telephone Encounter (Signed)
 Appt later today.

## 2023-10-05 NOTE — Patient Instructions (Signed)
 Recommend a flu shot this fall.  Also consider COVID-vaccine  Check the  blood pressure regularly Blood pressure goal:  between 110/65 and  135/85. If it is consistently higher or lower, let me know     GO TO THE LAB :  Get the blood work   Your results will be posted on MyChart with my comments  Go to the front desk for the checkout Please make an appointment for a physical exam by 03/2024

## 2023-10-06 NOTE — Assessment & Plan Note (Signed)
 HTN,Diastolic BP slightly elevated, recommend to monitor at home.  Continue amlodipine , carvedilol , last potassium was slightly low, recheck today. Easy bruising: See LOV, labs essentially normal.  Today she reports had difficulty with easy bruising all her life, has had multiple surgeries without any abnormal bleeding.  RX: Observation Low back pain, right wrist injury: Sees orthopedics Meningioma: At the left sphenoid wing, had gamma knife surgery 07/17/2023 Vaccine advice provided RTC 03-2024 CPX

## 2023-10-07 ENCOUNTER — Ambulatory Visit: Payer: Self-pay | Admitting: Internal Medicine

## 2023-10-22 ENCOUNTER — Other Ambulatory Visit: Payer: Self-pay

## 2023-10-22 ENCOUNTER — Telehealth: Payer: Self-pay | Admitting: Gastroenterology

## 2023-10-22 DIAGNOSIS — Z860100 Personal history of colon polyps, unspecified: Secondary | ICD-10-CM

## 2023-10-22 MED ORDER — NA SULFATE-K SULFATE-MG SULF 17.5-3.13-1.6 GM/177ML PO SOLN: 1.0000 | Freq: Once | ORAL | 0 refills | Status: AC

## 2023-10-22 NOTE — Telephone Encounter (Signed)
Colon scheduled, pt instructed and medications reviewed.  Patient instructions mailed to home.  Patient to call with any questions or concerns.

## 2023-10-22 NOTE — Telephone Encounter (Signed)
 Inbound call from patient requesting a all back to discuss scheduling her colonoscopy at the hospital. Please advise.

## 2023-10-22 NOTE — Telephone Encounter (Signed)
 Colon has been set up for 10/30 at 11 am at [facility] with GM

## 2023-10-22 NOTE — Telephone Encounter (Signed)
 Left message on machine to call back

## 2023-11-03 ENCOUNTER — Telehealth: Payer: Self-pay

## 2023-11-03 MED ORDER — BUDESONIDE-FORMOTEROL FUMARATE 160-4.5 MCG/ACT IN AERO: 2.0000 | INHALATION_SPRAY | Freq: Two times a day (BID) | RESPIRATORY_TRACT | 1 refills | Status: AC

## 2023-11-03 NOTE — Telephone Encounter (Signed)
 Copied from CRM #[id number]. Topic: Clinical - Medication Question >> Nov 03, 2023 12:16 PM Erin Fox wrote: Reason for CRM: Patient would like to know if she still needs to take budesonide -formoterol  (SYMBICORT ) 160-4.5 MCG/ACT inhaler or is it an automatic refill. Please call [phone]

## 2023-11-03 NOTE — Telephone Encounter (Signed)
 Refill sent.

## 2023-11-12 ENCOUNTER — Encounter: Payer: Self-pay | Admitting: Family Medicine

## 2023-11-12 ENCOUNTER — Ambulatory Visit: Admitting: Family Medicine

## 2023-11-12 VITALS — BP 100/70 | HR 57 | Temp 98.9°F | Resp 16 | Ht 66.5 in | Wt 155.0 lb

## 2023-11-12 DIAGNOSIS — H10402 Unspecified chronic conjunctivitis, left eye: Secondary | ICD-10-CM | POA: Diagnosis not present

## 2023-11-12 MED ORDER — MOXIFLOXACIN HCL 0.5 % OP SOLN: 1.0000 [drp] | Freq: Three times a day (TID) | OPHTHALMIC | 0 refills | Status: DC

## 2023-11-12 NOTE — Progress Notes (Signed)
 Subjective:    Patient ID: Erin Fox, female    DOB: [birth year], 63 y.o.   MRN: [id number]  Chief Complaint  Patient presents with   Eye Drainage    X1 month, left eye, green drainage, no pain, no redness, no injury.    HPI Patient is in today for d/c fromL eye,  Discussed the use of AI scribe software for clinical note transcription with the patient, who gave verbal consent to proceed.  History of Present Illness Erin Fox is a 63 year old female who presents with green discharge from her left eye.  Green discharge from the left eye began approximately one month ago, primarily noted in the morning, leading to crusting and the eye being stuck upon waking. She cleans the discharge throughout the day. No sensation of a foreign body, swelling, or throbbing behind the eye.  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 She was advised to monitor for swelling behind the eye post-procedure but has not experienced any such symptoms.  She experienced a fall at work on Monday, impacting a box and feeling as though she cracked a rib on her left side, resulting in cautious movement.  She is currently taking prednisone  and other steroids. No known medication allergies.  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 She has not visited an eye doctor this year but previously attended annual eye check-ups and used glasses.  No sensation of a foreign body in the eye, swelling, or throbbing behind the eye. Occasional cough and white nasal discharge.    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   Wears glasses     Past Surgical History:  Procedure Laterality Date   5th toes resected bilateral  1990's   bone's removed   ABDOMINAL HYSTERECTOMY  1984   W/ UNILATERAL SALPINGOOPHORECTOMY   ABDOMINOPLASTY  1990   BIOPSY  01/26/2023   Procedure: BIOPSY;  Surgeon: Wilhelmenia Aloha Raddle., MD;  Location: [facility];  Service: Gastroenterology;;   BREAST BIOPSY Right 2001   CESAREAN SECTION     COLONOSCOPY     COLONOSCOPY W/ POLYPECTOMY  last one 02-14-2014   COLONOSCOPY WITH PROPOFOL  N/A 05/23/2021   Procedure: COLONOSCOPY WITH PROPOFOL ;  Surgeon: Wilhelmenia Aloha Raddle., MD;  Location: [facility] ENDOSCOPY;  Service: Gastroenterology;  Laterality: N/A;   COLONOSCOPY WITH PROPOFOL  N/A 01/26/2023   Procedure: COLONOSCOPY WITH PROPOFOL ;  Surgeon: Mansouraty, Aloha Raddle., MD;  Location: [facility];  Service: Gastroenterology;  Laterality: N/A;   CYSTO/  RETROGRADE PYELOGRAM/  LEFT URETEROSCOPY/  BLADDER BIOPSY  08/27/2004   CYSTOSCOPY W/ RETROGRADES Bilateral 03/17/2014   Procedure: CYSTOSCOPY WITH RETROGRADE PYELOGRAM;  Surgeon: Ricardo  Alvaro, MD;  Location: [facility];  Service: Urology;  Laterality: Bilateral;   CYSTOSCOPY WITH BIOPSY N/A 03/17/2014   Procedure: CYSTOSCOPY WITH BIOPSY AND FULGERATION;  Surgeon: Ricardo Alvaro, MD;  Location: [facility];  Service: Urology;  Laterality: N/A;   ENDOSCOPIC MUCOSAL RESECTION N/A 05/23/2021   Procedure: ENDOSCOPIC MUCOSAL RESECTION;  Surgeon: Wilhelmenia Aloha Raddle., MD;  Location: [facility] ENDOSCOPY;  Service: Gastroenterology;  Laterality: N/A;   ENDOSCOPIC MUCOSAL RESECTION  01/26/2023   Procedure: ENDOSCOPIC MUCOSAL  RESECTION;  Surgeon: Wilhelmenia, Aloha Raddle., MD;  Location: [facility];  Service: Gastroenterology;;   HEMOSTASIS CLIP PLACEMENT  05/23/2021   Procedure: HEMOSTASIS CLIP PLACEMENT;  Surgeon: Wilhelmenia Aloha Raddle., MD;  Location: [facility] ENDOSCOPY;  Service: Gastroenterology;;   HEMOSTASIS CONTROL  01/26/2023   Procedure: HEMOSTASIS CONTROL;  Surgeon: Wilhelmenia Aloha Raddle., MD;  Location: [facility];  Service: Gastroenterology;;   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   Gamma knife   OTHER SURGICAL HISTORY Left 2016   Cyst Removal on Left ear   POLYPECTOMY     POLYPECTOMY  05/23/2021   Procedure: POLYPECTOMY;  Surgeon: Wilhelmenia Aloha Raddle., MD;  Location: [facility] ENDOSCOPY;  Service: Gastroenterology;;   POLYPECTOMY  01/26/2023   Procedure: POLYPECTOMY;  Surgeon: Wilhelmenia Aloha Raddle., MD;  Location: [facility];  Service: Gastroenterology;;   REMOVAL BARTHOLIN CYST/ GLAND AND REVISION LEFT LABIA  06/10/2004   SUBMUCOSAL LIFTING INJECTION  05/23/2021   Procedure: SUBMUCOSAL LIFTING INJECTION;  Surgeon: Wilhelmenia Aloha Raddle., MD;  Location: [facility] ENDOSCOPY;  Service: Gastroenterology;;   SUBMUCOSAL LIFTING INJECTION  01/26/2023   Procedure: SUBMUCOSAL LIFTING INJECTION;  Surgeon: Wilhelmenia Aloha Raddle., MD;  Location: [facility];  Service: Gastroenterology;;   SUBMUCOSAL TATTOO INJECTION  01/26/2023   Procedure: SUBMUCOSAL TATTOO INJECTION;  Surgeon: Wilhelmenia Aloha Raddle., MD;  Location: [facility];  Service: Gastroenterology;;    Family History  Problem Relation Age of Onset   Colon polyps Mother    Hyperlipidemia Mother    Hypertension Mother        M and sister    Diabetes Father    Colon polyps Sister 83       8 polyps removed   High blood pressure Sister    Cancer Paternal Aunt        lung   Colon cancer Paternal Uncle    Colon cancer Other        uncle , dx at age 26s   Breast cancer Neg Hx    Esophageal cancer Neg Hx    Rectal cancer Neg Hx    Stomach cancer Neg Hx      Social History   Socioeconomic History   Marital status: Divorced    Spouse name: Not on file   Number of children: 1   Years of education: Not on file   Highest education level: Not on file  Occupational History   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[employer])  Tobacco Use   Smoking status: Every Day    Current packs/day: 0.50    Average packs/day: 0.5 packs/day for 30.0 years (15.0 ttl pk-yrs)    Types: Cigarettes   Smokeless tobacco: Never   Tobacco comments:    Smoked since age ~15    1/2  ppd  Vaping Use   Vaping status: Never Used  Substance and Sexual Activity   Alcohol use: Yes    Alcohol/week: 0.0 standard drinks of alcohol    Comment: OCCASIONAL   Drug use: Yes    Types: Marijuana  Comment: at night  Last smoked marajuana 02/21/21   Sexual activity: Not on file  Other Topics Concern   Not on file  Social History Narrative   Her mother lives w/ her, she is elderly, has memory issues, + stress for [employer]       Social Drivers of Health   Financial Resource Strain: Not on file  Food Insecurity: Low Risk  (01/29/2023)   Received from [facility]   Hunger Vital Sign    Within the past 12 months, you worried that your food would run out before you got money to buy more: Never true    Within the past 12 months, the food you bought just didn't last and you didn't have money to get more. : Never true  Transportation Needs: No Transportation Needs (01/29/2023)   Received from [facility]    In the past 12 months, has lack of reliable transportation kept you from medical appointments, meetings, work or from getting things needed for daily living? : No  Physical Activity: Not on file  Stress: Not on file  Social Connections: Not on file  Intimate Partner Violence: Not on file    Outpatient Medications Prior to Visit  Medication Sig Dispense Refill   acyclovir  (ZOVIRAX ) 400 MG tablet Take 1 tablet (400 mg total) by mouth 2 (two) times daily  as needed. 180 tablet 3   amLODipine  (NORVASC ) 5 MG tablet Take 1 tablet (5 mg total) by mouth daily. 90 tablet 1   budesonide -formoterol  (SYMBICORT ) 160-4.5 MCG/ACT inhaler Inhale 2 puffs into the lungs 2 (two) times daily. 33 g 1   carvedilol  (COREG ) 12.5 MG tablet Take 1 tablet (12.5 mg total) by mouth 2 (two) times daily with a meal. 180 tablet 1   Cholecalciferol (D3 5000) 125 MCG (5000 UT) capsule Take 5,000 Units by mouth daily.     Cyanocobalamin  (B-12) 3000 MCG CAPS Take 3,000 mcg by mouth daily.     cyclobenzaprine  (FLEXERIL ) 5 MG tablet Take 1 tablet (5 mg total) by mouth 3 (three) times daily as needed for muscle spasms. 30 tablet 0   fluticasone  (FLONASE ) 50 MCG/ACT nasal spray Place 2 sprays into both nostrils daily. 48 g 1   folic acid  (FOLVITE ) 1 MG tablet TAKE 5 TABLETS BY MOUTH ONCE DAILY 450 tablet 0   pregabalin  (LYRICA ) 75 MG capsule Take 1 capsule (75 mg total) by mouth 2 (two) times daily. 60 capsule 0   PREVIDENT 5000 BOOSTER PLUS 1.1 % PSTE Place 1 application. onto teeth daily.     simvastatin  (ZOCOR ) 20 MG tablet Take 1 tablet (20 mg total) by mouth at bedtime. 90 tablet 1   methylPREDNISolone  (MEDROL  DOSEPAK) 4 MG TBPK tablet Take as prescribed on the box 21 tablet 0   No facility-administered medications prior to visit.    No Known Allergies  Review of Systems  Constitutional:  Negative for fever and malaise/fatigue.  HENT:  Negative for congestion.   Eyes:  Positive for discharge and redness. Negative for blurred vision.  Respiratory:  Negative for shortness of breath.   Cardiovascular:  Negative for chest pain, palpitations and leg swelling.  Gastrointestinal:  Negative for abdominal pain, blood in stool and nausea.  Genitourinary:  Negative for dysuria and frequency.  Musculoskeletal:  Negative for falls.  Skin:  Negative for rash.  Neurological:  Negative for dizziness, loss of consciousness and headaches.  Endo/Heme/Allergies:  Negative for  environmental allergies.  Psychiatric/Behavioral:  Negative for depression. The  patient is not nervous/anxious.        Objective:    Physical Exam Vitals and nursing note reviewed.  Constitutional:      General: She is not in acute distress.    Appearance: Normal appearance. She is well-developed.  HENT:     Head: Normocephalic and atraumatic.  Eyes:     General: Lids are normal. Vision grossly intact. No scleral icterus.       Right eye: No foreign body, discharge or hordeolum.        Left eye: Discharge present.No foreign body or hordeolum.     Extraocular Movements:     Right eye: Normal extraocular motion.     Left eye: Normal extraocular motion.     Conjunctiva/sclera:     Left eye: Left conjunctiva is injected.     Comments: Fluorescein and woods lamp used---- no abrasion   Cardiovascular:     Rate and Rhythm: Normal rate and regular rhythm.     Heart sounds: No murmur heard. Pulmonary:     Effort: Pulmonary effort is normal. No respiratory distress.     Breath sounds: Normal breath sounds.  Musculoskeletal:        General: Normal range of motion.     Cervical back: Normal range of motion and neck supple.     Right lower leg: No edema.     Left lower leg: No edema.  Skin:    General: Skin is warm and dry.  Neurological:     Mental Status: She is alert and oriented to person, place, and time.  Psychiatric:        Mood and Affect: Mood normal.        Behavior: Behavior normal.        Thought Content: Thought content normal.        Judgment: Judgment normal.     BP 100/70 (BP Location: Right Arm, Patient Position: Sitting, Cuff Size: Normal)   Pulse (!) 57   Temp 98.9 F (37.2 C) (Oral)   Resp 16   Ht 5' 6.5 (1.689 m)   Wt 155 lb (70.3 kg)   SpO2 97%   BMI 24.64 kg/m  Wt Readings from Last 3 Encounters:  11/12/23 155 lb (70.3 kg)  10/05/23 154 lb 3.2 oz (69.9 kg)  09/23/23 157 lb (71.2 kg)    Diabetic Foot Exam - Simple   No data filed    Lab  Results  Component Value Date   WBC 3.7 (L) 07/31/2023   HGB 13.3 07/31/2023   HCT 39.1 07/31/2023   PLT 253.0 07/31/2023   GLUCOSE 91 10/05/2023   CHOL 215 (H) 03/30/2023   TRIG 98.0 03/30/2023   HDL 106.90 03/30/2023   LDLDIRECT 121.0 11/05/2010   LDLCALC 89 03/30/2023   ALT 20 07/31/2023   AST 20 07/31/2023   NA 144 10/05/2023   K 3.9 10/05/2023   CL 102 10/05/2023   CREATININE 0.60 10/05/2023   BUN 12 10/05/2023   CO2 33 (H) 10/05/2023   TSH 3.75 03/28/2022   INR 1.1 (H) 07/31/2023   HGBA1C 5.4 03/30/2023    Lab Results  Component Value Date   TSH 3.75 03/28/2022   Lab Results  Component Value Date   WBC 3.7 (L) 07/31/2023   HGB 13.3 07/31/2023   HCT 39.1 07/31/2023   MCV 97.5 07/31/2023   PLT 253.0 07/31/2023   Lab Results  Component Value Date   NA 144 10/05/2023   K 3.9 10/05/2023  CO2 33 (H) 10/05/2023   GLUCOSE 91 10/05/2023   BUN 12 10/05/2023   CREATININE 0.60 10/05/2023   BILITOT 1.1 07/31/2023   ALKPHOS 66 07/31/2023   AST 20 07/31/2023   ALT 20 07/31/2023   PROT 6.9 07/31/2023   ALBUMIN 4.5 07/31/2023   CALCIUM 9.2 10/05/2023   GFR 96.75 10/05/2023   Lab Results  Component Value Date   CHOL 215 (H) 03/30/2023   Lab Results  Component Value Date   HDL 106.90 03/30/2023   Lab Results  Component Value Date   LDLCALC 89 03/30/2023   Lab Results  Component Value Date   TRIG 98.0 03/30/2023   Lab Results  Component Value Date   CHOLHDL 2 03/30/2023   Lab Results  Component Value Date   HGBA1C 5.4 03/30/2023       Assessment & Plan:  Chronic bacterial conjunctivitis of left eye -     Moxifloxacin HCl; Place 1 drop into the left eye 3 (three) times daily.  Dispense: 3 mL; Refill: 0  Assessment and Plan Assessment & Plan Conjunctivitis and dry eye, left eye   She presents with green discharge from the left eye for about a month, described as snotty and crusty, especially in the morning. The eye is very dry, indicated by  delayed uptake of yellow dye during examination. No recent sinus infection or contact with children. Currently on prednisone , which may increase infection vulnerability. Treated as conjunctivitis, which is contagious. Prescribe antibiotic eye drops, one drop three times a day. Advise washing of washcloths, towels, and pillowcases to prevent spread. Instruct to wash hands frequently and avoid touching the eyes. Recommend using Refresh or Systane drops for dry eye. If symptoms do not improve in 2-3 days, advise contacting an eye doctor.  Rib pain, left side, after fall   She reports rib pain on the left side following a fall at work after tripping over a box. Describes the pain as feeling like a cracked rib. The fall occurred on Monday, and the pain persists.    Jb Dulworth R Lowne Chase, DO

## 2023-11-16 ENCOUNTER — Ambulatory Visit: Admitting: Orthopedic Surgery

## 2023-11-16 ENCOUNTER — Other Ambulatory Visit: Payer: Self-pay

## 2023-11-16 DIAGNOSIS — M79641 Pain in right hand: Secondary | ICD-10-CM

## 2023-11-16 DIAGNOSIS — M5412 Radiculopathy, cervical region: Secondary | ICD-10-CM | POA: Diagnosis not present

## 2023-11-16 NOTE — Progress Notes (Signed)
 Orthopedic Spine Surgery Office Note   Assessment: Patient is a 63 y.o. female with two issues:   1) nondisplaced right distal radius fracture - healed 2) low back pain and numbness in the left anterior thigh which has been improving with time     Plan: -Neck pain radiating into right trapezius has come back, so recommend an MRI of the cervical spine to evaluate further -She can weight-bear as tolerated on the right upper extremity, no brace needed -Back is doing better, numbness over left thigh improving so will hold off on further work up at this time -Would need to be nicotine free prior to elective spine surgery -Patient should return to office in 4 weeks, x-rays at next visit: none   ___________________________________________________________________________     History:   Patient is a 63 y.o. female who presents today for follow-up on her right distal radius fracture and neck pain that radiates into the right trapezius muscle.  Her wrist is no longer bothering her.  She was able to discontinue the brace as recommended and is no longer using any form of immobilization.  She said she is back to using it normally.  She is not taking any pain medication for that.  She has noticed her neck pain getting worse again.  She feels that in the lower cervical region and going into the right trapezius.  It does not radiate past the lateral aspect of the shoulder.  She notes that pain with activity and at rest.  She has no pain radiating into the left upper extremity.  There is no trauma or injury that caused this worsening of her pain.   Treatments tried: PT, tylenol , flexeril , oral steroids     Physical Exam:   General: no acute distress, appears stated age Neurologic: alert, answering questions appropriately, following commands Respiratory: unlabored breathing on room air, symmetric chest rise Psychiatric: appropriate affect, normal cadence to speech     MSK (spine):   -Strength  exam                                                   Left                  Right Grip strength                5/5                  5/5 Interosseus                  5/5                  5/5 Wrist extension            5/5                  5/5 Wrist flexion                 5/5                  5/5 Elbow flexion                5/5                  5/5 Deltoid  5/5                  5/5     -Sensory exam                                       Sensation intact to light touch in C5-T1 nerve distributions of bilateral upper extremities   Right hand exam: no tenderness to palpation over the wrist/hand/forearm, no ecchymosis or swelling seen, AIN/PIN/IO intact, sensation intact to light touch in median/radial/ulnar nerve distributions, palpable radial pulse, hand warm and well-perfused   Imaging: XRs of the cervical spine from 09/02/2023 were previously independently reviewed and interpreted, showing anterior osteophyte formation at C4/5.  Disc height loss and anterior osteophyte formation seen at C5/6.  No evidence of instability on flexion/extension views.  No fracture or dislocation seen.   XRs of the right wrist from 11/16/2023 were independently reviewed and interpreted, showing nondisplaced distal radius fracture. Fracture line not well visualized. Alignment unchanged since prior films on 09/02/2023.     Patient name: Erin Fox Patient MRN: [id number] Date of visit: 11/16/23

## 2023-11-22 ENCOUNTER — Ambulatory Visit (HOSPITAL_BASED_OUTPATIENT_CLINIC_OR_DEPARTMENT_OTHER)
Admission: RE | Admit: 2023-11-22 | Discharge: 2023-11-22 | Disposition: A | Discharge status: Home / Self Care | Source: Ambulatory Visit | Attending: Orthopedic Surgery | Admitting: Orthopedic Surgery

## 2023-11-22 DIAGNOSIS — M5412 Radiculopathy, cervical region: Secondary | ICD-10-CM | POA: Diagnosis not present

## 2023-11-22 DIAGNOSIS — M4722 Other spondylosis with radiculopathy, cervical region: Secondary | ICD-10-CM | POA: Diagnosis not present

## 2023-11-22 DIAGNOSIS — M4803 Spinal stenosis, cervicothoracic region: Secondary | ICD-10-CM | POA: Diagnosis not present

## 2023-11-22 DIAGNOSIS — M4802 Spinal stenosis, cervical region: Secondary | ICD-10-CM | POA: Diagnosis not present

## 2023-11-26 ENCOUNTER — Encounter: Payer: Self-pay | Admitting: Gastroenterology

## 2023-12-01 ENCOUNTER — Other Ambulatory Visit: Payer: Self-pay | Admitting: Internal Medicine

## 2023-12-02 ENCOUNTER — Telehealth: Payer: Self-pay | Admitting: Gastroenterology

## 2023-12-02 NOTE — Telephone Encounter (Signed)
 Procedure:Colonoscopy Procedure date: 12/10/23 Procedure location: [facility] Arrival Time: 9:30 am Spoke with the patient Y/N:   No, I left a detailed message on [phone] on 12/02/23 @ 1:49 pm for the patient to return call  No, I left a detailed message on [phone] on 12/03/23 @ 1:32 pm for the patient to return call, Mychart message sent.  Yes, 12/04/23 @ 3:59 pm  Any prep concerns? No  Has the patient obtained the prep from the pharmacy ? Yes Do you have a care partner and transportation: Yes Any additional concerns? Went over instructions and resent them to the patient in Mychart

## 2023-12-03 ENCOUNTER — Encounter (HOSPITAL_COMMUNITY): Payer: Self-pay | Admitting: Gastroenterology

## 2023-12-03 NOTE — Progress Notes (Signed)
 Attempted to obtain medical history for pre op call via telephone, unable to reach at this time. HIPAA compliant voicemail message left requesting return call to pre surgical testing department.

## 2023-12-04 ENCOUNTER — Telehealth: Payer: Self-pay | Admitting: Gastroenterology

## 2023-12-04 NOTE — Telephone Encounter (Signed)
 The pt spoke with Erin Fox and all questions have been answered

## 2023-12-04 NOTE — Telephone Encounter (Signed)
 Patient is requesting a call to discuss prep instructions for 10/20 colonoscopy. Please advise, thank you.

## 2023-12-09 NOTE — Anesthesia Preprocedure Evaluation (Signed)
 Anesthesia Evaluation  Patient identified by MRN, date of birth, ID band Patient awake    Reviewed: Allergy & Precautions, NPO status , Patient's Chart, lab work & pertinent test results, reviewed documented beta blocker date and time   History of Anesthesia Complications Negative for: history of anesthetic complications  Airway Mallampati: II  TM Distance: >3 FB Neck ROM: Full    Dental no notable dental hx.    Pulmonary Current Smoker and Patient abstained from smoking.   Pulmonary exam normal        Cardiovascular hypertension, Pt. on medications and Pt. on home beta blockers Normal cardiovascular exam     Neuro/Psych   Anxiety     negative neurological ROS     GI/Hepatic ,,,(+)     substance abuse  marijuana useColon polyp   Endo/Other  negative endocrine ROS    Renal/GU negative Renal ROS     Musculoskeletal negative musculoskeletal ROS (+)    Abdominal   Peds  Hematology negative hematology ROS (+)   Anesthesia Other Findings   Reproductive/Obstetrics                              Anesthesia Physical Anesthesia Plan  ASA: 2  Anesthesia Plan: MAC   Post-op Pain Management: Minimal or no pain anticipated   Induction:   PONV Risk Score and Plan: Treatment may vary due to age or medical condition and Propofol  infusion  Airway Management Planned: Natural Airway and Simple Face Mask  Additional Equipment: None  Intra-op Plan:   Post-operative Plan:   Informed Consent: I have reviewed the patients History and Physical, chart, labs and discussed the procedure including the risks, benefits and alternatives for the proposed anesthesia with the patient or authorized representative who has indicated his/her understanding and acceptance.       Plan Discussed with: CRNA  Anesthesia Plan Comments:          Anesthesia Quick Evaluation

## 2023-12-10 ENCOUNTER — Other Ambulatory Visit: Payer: Self-pay

## 2023-12-10 ENCOUNTER — Encounter (HOSPITAL_COMMUNITY): Payer: Self-pay | Admitting: Gastroenterology

## 2023-12-10 ENCOUNTER — Ambulatory Visit (HOSPITAL_COMMUNITY)
Admission: RE | Admit: 2023-12-10 | Discharge: 2023-12-10 | Disposition: A | Discharge status: Home / Self Care | Attending: Gastroenterology | Admitting: Gastroenterology

## 2023-12-10 ENCOUNTER — Encounter (HOSPITAL_COMMUNITY): Payer: Self-pay | Admitting: Anesthesiology

## 2023-12-10 ENCOUNTER — Ambulatory Visit (HOSPITAL_COMMUNITY): Payer: Self-pay | Admitting: Anesthesiology

## 2023-12-10 ENCOUNTER — Other Ambulatory Visit: Payer: Self-pay | Admitting: Internal Medicine

## 2023-12-10 ENCOUNTER — Encounter (HOSPITAL_COMMUNITY)
Admission: RE | Disposition: A | Discharge status: Home / Self Care | Payer: Self-pay | Source: Home / Self Care | Attending: Gastroenterology

## 2023-12-10 DIAGNOSIS — Z860100 Personal history of colon polyps, unspecified: Secondary | ICD-10-CM

## 2023-12-10 DIAGNOSIS — Z87891 Personal history of nicotine dependence: Secondary | ICD-10-CM | POA: Diagnosis not present

## 2023-12-10 DIAGNOSIS — K649 Unspecified hemorrhoids: Secondary | ICD-10-CM

## 2023-12-10 DIAGNOSIS — K635 Polyp of colon: Secondary | ICD-10-CM

## 2023-12-10 DIAGNOSIS — F1290 Cannabis use, unspecified, uncomplicated: Secondary | ICD-10-CM | POA: Diagnosis not present

## 2023-12-10 DIAGNOSIS — Z1211 Encounter for screening for malignant neoplasm of colon: Secondary | ICD-10-CM | POA: Diagnosis not present

## 2023-12-10 DIAGNOSIS — Z9889 Other specified postprocedural states: Secondary | ICD-10-CM

## 2023-12-10 DIAGNOSIS — I10 Essential (primary) hypertension: Secondary | ICD-10-CM | POA: Insufficient documentation

## 2023-12-10 DIAGNOSIS — L818 Other specified disorders of pigmentation: Secondary | ICD-10-CM

## 2023-12-10 DIAGNOSIS — K5730 Diverticulosis of large intestine without perforation or abscess without bleeding: Secondary | ICD-10-CM | POA: Insufficient documentation

## 2023-12-10 DIAGNOSIS — K644 Residual hemorrhoidal skin tags: Secondary | ICD-10-CM | POA: Insufficient documentation

## 2023-12-10 DIAGNOSIS — K621 Rectal polyp: Secondary | ICD-10-CM | POA: Diagnosis not present

## 2023-12-10 DIAGNOSIS — Q438 Other specified congenital malformations of intestine: Secondary | ICD-10-CM | POA: Insufficient documentation

## 2023-12-10 DIAGNOSIS — K641 Second degree hemorrhoids: Secondary | ICD-10-CM | POA: Diagnosis not present

## 2023-12-10 DIAGNOSIS — Q439 Congenital malformation of intestine, unspecified: Secondary | ICD-10-CM

## 2023-12-10 DIAGNOSIS — D125 Benign neoplasm of sigmoid colon: Secondary | ICD-10-CM | POA: Diagnosis not present

## 2023-12-10 HISTORY — PX: COLONOSCOPY: SHX5424

## 2023-12-10 SURGERY — COLONOSCOPY
Anesthesia: Monitor Anesthesia Care

## 2023-12-10 MED ORDER — PROPOFOL 10 MG/ML IV BOLUS
INTRAVENOUS | Status: DC | PRN
  Administered 2023-12-10 (×2): 30 mg via INTRAVENOUS
  Administered 2023-12-10 (×2): 20 mg via INTRAVENOUS

## 2023-12-10 MED ORDER — PROPOFOL 500 MG/50ML IV EMUL
INTRAVENOUS | Status: DC | PRN
Start: 2023-12-10 — End: 2023-12-10
  Administered 2023-12-10: 125 ug/kg/min via INTRAVENOUS

## 2023-12-10 MED ORDER — SODIUM CHLORIDE 0.9 % IV SOLN: INTRAVENOUS | Status: DC

## 2023-12-10 MED ORDER — PROPOFOL 500 MG/50ML IV EMUL
INTRAVENOUS | Status: AC
  Filled 2023-12-10: qty 50

## 2023-12-10 NOTE — Transfer of Care (Signed)
 Immediate Anesthesia Transfer of Care Note  Patient: Takiesha Mcdevitt  Procedure(s) Performed: COLONOSCOPY  Patient Location: PACU and Endoscopy Unit  Anesthesia Type:MAC  Level of Consciousness: awake and alert   Airway & Oxygen Therapy: Patient Spontanous Breathing  Post-op Assessment: Report given to RN and Post -op Vital signs reviewed and stable  Post vital signs: Reviewed and stable  Last Vitals:  Vitals Value Taken Time  BP 149/87 12/10/23 11:27  Temp    Pulse 52 12/10/23 11:29  Resp 21 12/10/23 11:29  SpO2 97 % 12/10/23 11:29  Vitals shown include unfiled device data.  Last Pain:  Vitals:   12/10/23 0958  TempSrc: Temporal  PainSc: 0-No pain      Patients Stated Pain Goal: 0 (12/10/23 0958)  Complications: No notable events documented.

## 2023-12-10 NOTE — H&P (Signed)
 GASTROENTEROLOGY PROCEDURE H&P NOTE   Primary Care Physician: Amon Aloysius BRAVO, MD  HPI: Erin Fox is a 63 y.o. female who presents for Colonoscopy for surveillance of previous Advanced resection adenomas.  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   Wears glasses    Past Surgical History:  Procedure Laterality Date   5th toes resected bilateral  1990's   bone's removed   ABDOMINAL HYSTERECTOMY  1984   W/ UNILATERAL SALPINGOOPHORECTOMY   ABDOMINOPLASTY  1990   BIOPSY  01/26/2023   Procedure: BIOPSY;  Surgeon: Wilhelmenia Aloha Raddle., MD;  Location: [facility];  Service: Gastroenterology;;   BREAST BIOPSY Right 2001   CESAREAN SECTION     COLONOSCOPY     COLONOSCOPY W/ POLYPECTOMY  last one 02-14-2014   COLONOSCOPY WITH PROPOFOL  N/A 05/23/2021   Procedure: COLONOSCOPY WITH PROPOFOL ;  Surgeon: Wilhelmenia Aloha Raddle., MD;  Location: [facility]COPY;  Service: Gastroenterology;  Laterality: N/A;   COLONOSCOPY WITH PROPOFOL  N/A 01/26/2023   Procedure: COLONOSCOPY WITH PROPOFOL ;  Surgeon: Mansouraty, Aloha Raddle., MD;  Location: [facility];  Service: Gastroenterology;  Laterality: N/A;   CYSTO/  RETROGRADE PYELOGRAM/  LEFT URETEROSCOPY/  BLADDER BIOPSY  08/27/2004   CYSTOSCOPY W/ RETROGRADES Bilateral 03/17/2014   Procedure: CYSTOSCOPY WITH RETROGRADE PYELOGRAM;  Surgeon: Ricardo Likens, MD;  Location: [facility];   Service: Urology;  Laterality: Bilateral;   CYSTOSCOPY WITH BIOPSY N/A 03/17/2014   Procedure: CYSTOSCOPY WITH BIOPSY AND FULGERATION;  Surgeon: Ricardo Likens, MD;  Location: [facility];  Service: Urology;  Laterality: N/A;   ENDOSCOPIC MUCOSAL RESECTION N/A 05/23/2021   Procedure: ENDOSCOPIC MUCOSAL RESECTION;  Surgeon: Wilhelmenia Aloha Raddle., MD;  Location: [facility] ENDOSCOPY;  Service: Gastroenterology;  Laterality: N/A;   ENDOSCOPIC MUCOSAL RESECTION  01/26/2023   Procedure: ENDOSCOPIC MUCOSAL RESECTION;  Surgeon: Wilhelmenia Aloha Raddle., MD;  Location: [facility];  Service: Gastroenterology;;   HEMOSTASIS CLIP PLACEMENT  05/23/2021   Procedure: HEMOSTASIS CLIP PLACEMENT;  Surgeon: Wilhelmenia Aloha Raddle., MD;  Location: [facility] ENDOSCOPY;  Service: Gastroenterology;;   HEMOSTASIS CONTROL  01/26/2023   Procedure: HEMOSTASIS CONTROL;  Surgeon: Wilhelmenia Aloha Raddle., MD;  Location: [facility];  Service: Gastroenterology;;   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   Gamma knife   OTHER SURGICAL HISTORY Left 2016   Cyst Removal on Left ear   POLYPECTOMY     POLYPECTOMY  05/23/2021   Procedure: POLYPECTOMY;  Surgeon: Wilhelmenia Aloha Raddle., MD;  Location: [facility] ENDOSCOPY;  Service: Gastroenterology;;   POLYPECTOMY  01/26/2023   Procedure: POLYPECTOMY;  Surgeon: Wilhelmenia Aloha Raddle., MD;  Location: [facility];  Service: Gastroenterology;;   REMOVAL BARTHOLIN CYST/ GLAND AND REVISION LEFT LABIA  06/10/2004   SUBMUCOSAL LIFTING INJECTION  05/23/2021   Procedure: SUBMUCOSAL LIFTING INJECTION;  Surgeon: Wilhelmenia Aloha Raddle., MD;  Location: [facility] ENDOSCOPY;  Service:  Gastroenterology;;   SUBMUCOSAL LIFTING INJECTION  01/26/2023   Procedure: SUBMUCOSAL LIFTING INJECTION;  Surgeon: Wilhelmenia Aloha Raddle., MD;  Location: [facility];  Service: Gastroenterology;;   SUBMUCOSAL TATTOO INJECTION  01/26/2023   Procedure: SUBMUCOSAL TATTOO INJECTION;  Surgeon: Wilhelmenia Aloha Raddle., MD;   Location: [facility];  Service: Gastroenterology;;   Current Facility-Administered Medications  Medication Dose Route Frequency Provider Last Rate Last Admin   0.9 %  sodium chloride  infusion   Intravenous Continuous Mansouraty, Aloha Raddle., MD 20 mL/hr at 12/10/23 1011 New Bag at 12/10/23 1011    Current Facility-Administered Medications:    0.9 %  sodium chloride  infusion, , Intravenous, Continuous, Mansouraty, Aloha Raddle., MD, Last Rate: 20 mL/hr at 12/10/23 1011, New Bag at 12/10/23 1011 No Known Allergies Family History  Problem Relation Age of Onset   Colon polyps Mother    Hyperlipidemia Mother    Hypertension Mother        M and sister    Diabetes Father    Colon polyps Sister 63       8 polyps removed   High blood pressure Sister    Cancer Paternal Aunt        lung   Colon cancer Paternal Uncle    Colon cancer Other        uncle , dx at age 51s   Breast cancer Neg Hx    Esophageal cancer Neg Hx    Rectal cancer Neg Hx    Stomach cancer Neg Hx    Social History   Socioeconomic History   Marital status: Divorced    Spouse name: Not on file   Number of children: 1   Years of education: Not on file   Highest education level: Not on file  Occupational History   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[employer])  Tobacco Use   Smoking status: Every Day    Current packs/day: 0.50    Average packs/day: 0.5 packs/day for 30.0 years (15.0 ttl pk-yrs)    Types: Cigarettes   Smokeless tobacco: Never   Tobacco comments:    Smoked since age ~46    1/2  ppd  Vaping Use   Vaping status: Never Used  Substance and Sexual Activity   Alcohol use: Yes    Alcohol/week: 0.0 standard drinks of alcohol    Comment: OCCASIONAL   Drug use: Yes    Types: Marijuana    Comment: at night  Last smoked marajuana 02/21/21   Sexual activity: Not on file  Other Topics Concern   Not on file  Social History Narrative   Her mother lives w/ her, she is elderly, has memory issues, + stress for  [employer]       Social Drivers of Health   Financial Resource Strain: Not on file  Food Insecurity: Low Risk  (01/29/2023)   Received from [facility]   Hunger Vital Sign    Within the past 12 months, you worried that your food would run out before you got money to buy more: Never true    Within the past 12 months, the food you bought just didn't last and you didn't have money to get more. : Never true  Transportation Needs: No Transportation Needs (01/29/2023)   Received from [facility]    In the past 12 months, has lack of reliable transportation kept you from medical appointments, meetings, work or from getting things needed for daily living? : No  Physical Activity: Not on file  Stress: Not  on file  Social Connections: Not on file  Intimate Partner Violence: Not on file    Physical Exam: Today's Vitals   12/10/23 0958  BP: 132/78  Resp: 13  Temp: (!) 97.5 F (36.4 C)  TempSrc: Temporal  SpO2: 98%  Weight: 68 kg  Height: 5' 7.5 (1.715 m)  PainSc: 0-No pain   Body mass index is 23.15 kg/m. GEN: NAD EYE: Sclerae anicteric ENT: MMM CV: Non-tachycardic GI: Soft, NT/ND NEURO:  Alert & Oriented x 3  Lab Results: No results for input(s): WBC, HGB, HCT, PLT in the last 72 hours. BMET No results for input(s): NA, K, CL, CO2, GLUCOSE, BUN, CREATININE, CALCIUM in the last 72 hours. LFT No results for input(s): PROT, ALBUMIN, AST, ALT, ALKPHOS, BILITOT, BILIDIR, IBILI in the last 72 hours. PT/INR No results for input(s): LABPROT, INR in the last 72 hours.   Impression / Plan: This is a 63 y.o.female who presents for Colonoscopy for surveillance of previous Advanced resection adenomas.  The risks and benefits of endoscopic evaluation/treatment were discussed with the patient and/or family; these include but are not limited to the risk of perforation, infection, bleeding, missed lesions, lack of diagnosis,  severe illness requiring hospitalization, as well as anesthesia and sedation related illnesses.  The patient's history has been reviewed, patient examined, no change in status, and deemed stable for procedure.  The patient and/or family is agreeable to proceed.    Aloha Finner, MD [facility] Advanced Endoscopy Office # [phone]

## 2023-12-10 NOTE — Op Note (Signed)
 [facility] Patient Name: Erin Fox Procedure Date: 12/10/2023 MRN: [id number] Attending MD: Aloha Finner , MD, [id number] Date of Birth: 1960-09-04 CSN: [id number] Age: 63 Admit Type: Outpatient Procedure:                Colonoscopy Indications:              High risk colon cancer surveillance: Personal                            history of sessile serrated colon polyp (10 mm or                            greater in size) Providers:                Aloha Finner, MD, Jacquelyn Jaci Pierce,                            RN, Hansford Petiford, Pensions Consultant, Sandra J. Dasie                            CRNA, CRNA Referring MD:             Victory CROME. Legrand, MD Medicines:                Monitored Anesthesia Care Complications:            No immediate complications. Estimated Blood Loss:     Estimated blood loss was minimal. Procedure:                Pre-Anesthesia Assessment:                           - Prior to the procedure, a History and Physical                            was performed, and patient medications and                            allergies were reviewed. The patient's tolerance of                            previous anesthesia was also reviewed. The risks                            and benefits of the procedure and the sedation                            options and risks were discussed with the patient.                            All questions were answered, and informed consent                            was obtained. Prior Anticoagulants: The patient has  taken no anticoagulant or antiplatelet agents                            except for NSAID medication. ASA Grade Assessment:                            II - A patient with mild systemic disease. After                            reviewing the risks and benefits, the patient was                            deemed in satisfactory condition to undergo the                             procedure.                           After obtaining informed consent, the colonoscope                            was passed under direct vision. Throughout the                            procedure, the patient's blood pressure, pulse, and                            oxygen saturations were monitored continuously. The                            PCF-H190TL ([id number] Olympus colonoscope was                            introduced through the anus and advanced to the the                            cecum, identified by appendiceal orifice and                            ileocecal valve. The colonoscopy was performed                            without difficulty. The patient tolerated the                            procedure. The quality of the bowel preparation was                            good. The ileocecal valve, appendiceal orifice, and                            rectum were photographed. Scope In: 11:02:19 AM Scope Out: 11:20:06 AM Scope Withdrawal Time: 0 hours 12 minutes 49 seconds  Total Procedure Duration: 0 hours 17 minutes 47 seconds  Findings:      The digital rectal exam findings include hemorrhoids. Pertinent       negatives include no palpable rectal lesions.      The colon (entire examined portion) was significantly tortuous.      Three sessile polyps were found in the sigmoid colon. The polyps were 3       to 6 mm in size. These polyps were removed with a cold snare. Resection       and retrieval were complete.      Multiple small-mouthed diverticula were found in the entire colon.      3 tattoos were seen in the transverse colon (2) and at the hepatic       flexure (1). Post-polypectomy scars were found at the tattoo sites.      Normal mucosa was found in the entire colon otherwise.      Non-bleeding non-thrombosed external and internal hemorrhoids were found       during retroflexion, during perianal exam and during digital exam. The       hemorrhoids were Grade II  (internal hemorrhoids that prolapse but reduce       spontaneously). Impression:               - Hemorrhoids found on digital rectal exam.                           - Significantly tortuous colon.                           - Three 3 to 6 mm polyps in the sigmoid colon,                            removed with a cold snare. Resected and retrieved.                           - Diverticulosis in the entire examined colon.                           - Three separate tattoos were seen in the                            transverse colon (2) and at the hepatic flexure                            (1). Post-polypectomy scars were found at the                            tattoo sites.                           - Normal mucosa in the entire examined colon                            otherwise.                           - Non-bleeding non-thrombosed external and internal  hemorrhoids. Moderate Sedation:      Not Applicable - Patient had care per Anesthesia. Recommendation:           - The patient will be observed post-procedure,                            until all discharge criteria are met.                           - Discharge patient to home.                           - Patient has a contact number available for                            emergencies. The signs and symptoms of potential                            delayed complications were discussed with the                            patient. Return to normal activities tomorrow.                            Written discharge instructions were provided to the                            patient.                           - High fiber diet.                           - Use FiberCon 1-2 tablets PO daily.                           - Continue present medications.                           - Await pathology results.                           - Discuss with primary GI and consider based on                            number of  serrated polyps and having had more than                            5 SSPs and 3 that are >10 mm in size for                            consideration of genetics vs whether SSP syndrome                            could be possible as well. This would then consider  possible repeat colonscopy every 1-2 years.                           - Repeat colonoscopy in no longer than 3 years for                            surveillance.                           - The findings and recommendations were discussed                            with the patient.                           - The findings and recommendations were discussed                            with the patient's family. Procedure Code(s):        --- Professional ---                           [phone], Colonoscopy, flexible; with removal of                            tumor(s), polyp(s), or other lesion(s) by snare                            technique Diagnosis Code(s):        --- Professional ---                           Z86.010, Personal history of colonic polyps                           D12.5, Benign neoplasm of sigmoid colon                           K64.1, Second degree hemorrhoids                           K57.30, Diverticulosis of large intestine without                            perforation or abscess without bleeding                           Q43.8, Other specified congenital malformations of                            intestine CPT copyright 2022 American Medical Association. All rights reserved. The codes documented in this report are preliminary and upon coder review may  be revised to meet current compliance requirements. Aloha Finner, MD 12/10/2023 11:34:33 AM Number of Addenda: 0

## 2023-12-10 NOTE — Discharge Instructions (Signed)

## 2023-12-10 NOTE — Anesthesia Postprocedure Evaluation (Signed)
 Anesthesia Post Note  Patient: Erin Fox  Procedure(s) Performed: COLONOSCOPY     Patient location during evaluation: PACU Anesthesia Type: MAC Level of consciousness: awake and alert Pain management: pain level controlled Vital Signs Assessment: post-procedure vital signs reviewed and stable Respiratory status: spontaneous breathing, nonlabored ventilation and respiratory function stable Cardiovascular status: blood pressure returned to baseline Postop Assessment: no apparent nausea or vomiting Anesthetic complications: no   No notable events documented.  Last Vitals:  Vitals:   12/10/23 1130 12/10/23 1140  BP: 133/85 (!) 153/84  Pulse: (!) 53 (!) 48  Resp: (!) 24 (!) 23  Temp:    SpO2: 98% 96%    Last Pain:  Vitals:   12/10/23 1140  TempSrc:   PainSc: 0-No pain                 Vertell Row

## 2023-12-10 NOTE — Anesthesia Procedure Notes (Signed)
 Procedure Name: MAC Date/Time: 12/10/2023 10:46 AM  Performed by: Dasie Nena PARAS, CRNAPre-anesthesia Checklist: Patient identified, Emergency Drugs available, Suction available, Patient being monitored and Timeout performed Oxygen Delivery Method: Simple face mask Placement Confirmation: positive ETCO2

## 2023-12-14 ENCOUNTER — Ambulatory Visit: Admitting: Orthopedic Surgery

## 2023-12-14 ENCOUNTER — Encounter: Payer: Self-pay | Admitting: Radiology

## 2023-12-14 DIAGNOSIS — M5412 Radiculopathy, cervical region: Secondary | ICD-10-CM | POA: Diagnosis not present

## 2023-12-14 DIAGNOSIS — R200 Anesthesia of skin: Secondary | ICD-10-CM

## 2023-12-14 LAB — SURGICAL PATHOLOGY

## 2023-12-14 NOTE — Progress Notes (Signed)
 Orthopedic Spine Surgery Office Note   Assessment: Patient is a 63 y.o. 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 2) left anterior thigh decreased sensation     Plan: -Recommended a diagnostic/therapeutic injection for her neck and right shoulder pain -Ordered a EMG/NCS to further work up the numbness over her left anterior thigh -If she is not doing any better at our next visit, will order a MRI of the lumbar spine to evaluate for radiculopathy -Would need to be nicotine free prior to elective spine surgery -Patient should return to office in 6 weeks, x-rays at next visit: AP/lateral/flex/ex lumbar   ___________________________________________________________________________     History:   Patient is a 62 y.o. female who presents today for follow up on her neck and right trapezius pain. She continues to have significant neck and right trapezius and shoulder pain. She does not have any pain radiating past the shoulder. She is not having any pain in the left upper extremity. She notes the pain on a daily basis. It sometimes will go away briefly, for a couple of days, but then always returns. She notes the pain with activity and rest.   She also continues to note left anterior thigh numbness. She does not have any pain radiating into the leg, but her decreased sensation has not improved and she has had this symptom for several months now.    Treatments tried: PT, tylenol , flexeril , oral steroids     Physical Exam:   General: no acute distress, appears stated age Neurologic: alert, answering questions appropriately, following commands Respiratory: unlabored breathing on room air, symmetric chest rise Psychiatric: appropriate affect, normal cadence to speech     MSK (spine):   -Strength exam                                                   Left                  Right Grip strength                5/5                   5/5 Interosseus                  5/5                  5/5 Wrist extension            5/5                  5/5 Wrist flexion                 5/5                  5/5 Elbow flexion                5/5                  5/5 Deltoid                          5/5  5/5                                                   Left                  Right EHL                              5/5                  5/5 TA                                 5/5                  5/5 GSC                             5/5                  5/5 Knee extension            5/5                  5/5 Hip flexion                    5/5                  5/5    -Sensory exam                                       Sensation intact to light touch in C5-T1 nerve distributions of bilateral upper extremities             Sensation intact to light touch in L3-S1 nerve distributions bilaterally (decreased in L3 distribution on the left)     Imaging: XRs of the cervical spine from 09/02/2023 were previously independently reviewed and interpreted, showing anterior osteophyte formation at C4/5.  Disc height loss and anterior osteophyte formation seen at C5/6.  No evidence of instability on flexion/extension views.  No fracture or dislocation seen.   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 No other significant stenosis seen. No T2 cord signal change seen.      Patient name: Erin Fox Patient MRN: [id number] Date of visit: 12/14/23

## 2023-12-18 ENCOUNTER — Ambulatory Visit: Payer: Self-pay | Admitting: Gastroenterology

## 2023-12-20 ENCOUNTER — Encounter: Payer: Self-pay | Admitting: Gastroenterology

## 2023-12-22 NOTE — Progress Notes (Signed)
 Colon recall placed. Letter mailed.

## 2024-01-01 ENCOUNTER — Other Ambulatory Visit: Payer: Self-pay

## 2024-01-01 ENCOUNTER — Encounter: Payer: Self-pay | Admitting: Neurology

## 2024-01-01 DIAGNOSIS — R202 Paresthesia of skin: Secondary | ICD-10-CM

## 2024-01-04 ENCOUNTER — Telehealth: Payer: Self-pay | Admitting: Gastroenterology

## 2024-01-04 NOTE — Telephone Encounter (Signed)
 Dr Legrand pt that Dr Wilhelmenia has left up to Dr Legrand to determine need for genetics testing.  The pt has been asked to have the testing faxed to Dr Legrand to review from her GYN.  She will have faxed to Dr Legrand attention

## 2024-01-04 NOTE — Telephone Encounter (Signed)
 Patient stated she was advised to have genetic testing completed. States she had it completed in 2023 with Dr. Henry with OBGYN. Patient is requesting to know if she should complete it again. Please advise, thank you

## 2024-01-12 ENCOUNTER — Other Ambulatory Visit: Payer: Self-pay | Admitting: Family Medicine

## 2024-01-12 DIAGNOSIS — H10402 Unspecified chronic conjunctivitis, left eye: Secondary | ICD-10-CM

## 2024-01-12 DIAGNOSIS — N898 Other specified noninflammatory disorders of vagina: Secondary | ICD-10-CM | POA: Diagnosis not present

## 2024-01-12 DIAGNOSIS — R8290 Unspecified abnormal findings in urine: Secondary | ICD-10-CM | POA: Diagnosis not present

## 2024-01-12 DIAGNOSIS — Z01419 Encounter for gynecological examination (general) (routine) without abnormal findings: Secondary | ICD-10-CM | POA: Diagnosis not present

## 2024-01-15 ENCOUNTER — Encounter: Payer: Self-pay | Admitting: Gastroenterology

## 2024-01-22 ENCOUNTER — Other Ambulatory Visit: Payer: Self-pay | Admitting: Internal Medicine

## 2024-01-28 ENCOUNTER — Ambulatory Visit: Admitting: Physical Medicine and Rehabilitation

## 2024-01-28 VITALS — BP 155/91 | HR 49

## 2024-01-28 DIAGNOSIS — M542 Cervicalgia: Secondary | ICD-10-CM | POA: Diagnosis not present

## 2024-01-28 DIAGNOSIS — M5010 Cervical disc disorder with radiculopathy, unspecified cervical region: Secondary | ICD-10-CM

## 2024-01-28 DIAGNOSIS — M5412 Radiculopathy, cervical region: Secondary | ICD-10-CM | POA: Diagnosis not present

## 2024-01-28 NOTE — Progress Notes (Signed)
 Pain Scale   Average Pain 0 Patient advising she has neck pain that comes and goes without warning.        +Driver, -BT, -Dye Allergies.

## 2024-01-31 DIAGNOSIS — R197 Diarrhea, unspecified: Secondary | ICD-10-CM | POA: Diagnosis not present

## 2024-01-31 DIAGNOSIS — K141 Geographic tongue: Secondary | ICD-10-CM | POA: Diagnosis not present

## 2024-01-31 DIAGNOSIS — R0981 Nasal congestion: Secondary | ICD-10-CM | POA: Diagnosis not present

## 2024-02-01 ENCOUNTER — Ambulatory Visit: Admitting: Orthopedic Surgery

## 2024-02-07 ENCOUNTER — Encounter: Payer: Self-pay | Admitting: Physical Medicine and Rehabilitation

## 2024-02-07 NOTE — Progress Notes (Signed)
 "  Erin Fox - 63 y.o. female MRN [id number]  Date of birth: [birth year]  Office Visit Note: Visit Date: 01/28/2024 PCP: Amon Aloysius BRAVO, MD Referred by: Georgina Ozell LABOR, MD  Subjective: Chief Complaint  Patient presents with   Neck - Pain   HPI: Erin Fox is a 63 y.o. female who comes in today for evaluation and management at the request of Dr. Ozell Georgina for chronic right sided cervicalgia and trapezial pain.  She has been having this for some time and has been followed by Dr. Ozell Georgina.  He requested cervical epidural injection but she comes in today really without much in the way of pain currently.  Is been actually several weeks now with no pain at all.  She reports history of this intermittent pain off-and-on that would always recur.  She describes the left-sided symptoms.  We went over the injections at length and what they are good for and how they work.  Went over her MRI with her showing multilevel foraminal narrowing and a left-sided paracentral disc protrusion.  She has not noted any focal weakness.  No associated headaches etc.     Review of Systems  Musculoskeletal:  Positive for neck pain.  All other systems reviewed and are negative.  Otherwise per HPI.  Assessment & Plan: Visit Diagnoses:    ICD-10-CM   1. Radiculopathy, cervical region  M54.12     2. Cervical disc disorder with radiculopathy  M50.10     3. Cervicalgia  M54.2        Plan: Findings:  Chronic history of neck pain and right trapezial pain.  Likely some component of cervical radiculopathy versus myofascial pain syndrome and trigger point.  Would complete epidural injection in the future should her pain return she has to call us .  She can follow-up with Dr. Ozell Georgina.    Meds & Orders: No orders of the defined types were placed in this encounter.  No orders of the defined types were placed in this encounter.   Follow-up: Return for Ozell Georgina, MD.   Procedures: No  procedures performed      Clinical History: MRI CERVICAL SPINE WITHOUT CONTRAST 11/22/2023 10:05:00 AM   TECHNIQUE: Multiplanar multisequence MRI of the cervical spine was performed.   COMPARISON: None available.   CLINICAL HISTORY: Cervical radiculopathy.   FINDINGS:   BONES AND ALIGNMENT: Straightening of the normal cervical lordosis. Mild degenerative facet edema on the right at C7-T1.   SPINAL CORD: No abnormal spinal cord signal.   SOFT TISSUES: Unremarkable   C2-C3: No significant findings.   C3-C4: Moderate right foraminal stenosis due to facet and uncinate spurring and suspected right foraminal disc protrusion. Borderline central stenosis due to disc bulge.   C4-C5: No impingement. Degenerative right facet arthropathy. Type 2 degenerative endplate findings.   C5-C6: Moderate bilateral foraminal stenosis and moderate left eccentric central stenosis due to left paracentral and lateral recess disc protrusion, uncinate spurring, disc bulge, and mild facet arthropathy. Type 2 degenerative endplate findings. Disc desiccation with loss of disc height.   C6-C7: No significant findings.   C7-T1: Moderate right foraminal stenosis primarily from facet arthropathy and uncinate spurring. Mild degenerative facet edema on the right.   Note: Motion artifact is present, reducing diagnostic sensitivity and specificity.   IMPRESSION: 1. Moderate bilateral foraminal stenosis and moderate left-eccentric central stenosis at C5-C6 due to left paracentral/lateral recess disc protrusion, uncinate spurring, disc bulge, and mild facet arthropathy. 2. Moderate right foraminal stenosis  at C3-C4 due to facet and uncinate spurring with suspected right foraminal disc protrusion. 3. Moderate right foraminal stenosis at C7-T1 primarily from facet arthropathy and uncinate spurring. 4. Mild degenerative facet edema on the right at C7-T1. 5. Straightening of the normal cervical  lordosis. 6. Motion artifact reduces diagnostic sensitivity and specificity.   Electronically signed by: Ryan Salvage MD 11/25/2023   She reports that she has been smoking cigarettes. She has a 15 pack-year smoking history. She has never used smokeless tobacco.  Recent Labs    03/30/23 1122  HGBA1C 5.4    Objective:  VS:  HT:    WT:   BMI:     BP:(!) 155/91  HR:(!) 49bpm  TEMP: ( )  RESP:  Physical Exam Vitals and nursing note reviewed.  Constitutional:      General: She is not in acute distress.    Appearance: Normal appearance. She is not ill-appearing.  HENT:     Head: Normocephalic and atraumatic.     Right Ear: External ear normal.     Left Ear: External ear normal.  Eyes:     Extraocular Movements: Extraocular movements intact.  Cardiovascular:     Rate and Rhythm: Normal rate.     Pulses: Normal pulses.  Musculoskeletal:     Cervical back: Tenderness present. No rigidity.     Right lower leg: No edema.     Left lower leg: No edema.     Comments: Patient has good strength in the upper extremities including 5 out of 5 strength in wrist extension long finger flexion and APB.  There is no atrophy of the hands intrinsically.  There is a negative Hoffmann's test.   Lymphadenopathy:     Cervical: No cervical adenopathy.  Skin:    Findings: No erythema, lesion or rash.  Neurological:     General: No focal deficit present.     Mental Status: She is alert and oriented to person, place, and time.     Sensory: No sensory deficit.     Motor: No weakness or abnormal muscle tone.     Coordination: Coordination normal.  Psychiatric:        Mood and Affect: Mood normal.        Behavior: Behavior normal.     Ortho Exam  Imaging: No results found.  Past Medical/Family/Surgical/Social History: Medications & Allergies reviewed per EMR, new medications updated. 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   Reactive airway disease 07/28/2017   Hypertension 09/03/2016   PCP NOTES >>>>>>>>>>>>>>>>>> 05/08/2015   Epidermoid cyst of skin of ear 06/06/2014   Hematuria 04/11/2014   Vitamin D  deficiency 07/22/2013   Annual physical exam 11/05/2010   Anxiety state 08/03/2009   Insomnia-PCP notes 11/11/2006   HSV 05/22/2006   Dyslipidemia 05/22/2006   Past Medical History:  Diagnosis Date   Anxiety    Atypical mole 12/13/2018   right abdomen moderate   Blood transfusion without reported diagnosis    after pregnancy 5-6 units   Cyst on ear    Left ear   Diverticulosis of colon    Erythematous bladder mucosa    Hematuria    w/u neg ~ 01-2014, 03-2014   Hernia, umbilical    History of colon polyps  HSV (herpes simplex virus) infection    Hyperlipidemia    Hypertension    Insomnia    Nephrolithiasis    NON-OBSTRUCTIVE- pt denies this   OAB (overactive bladder)    Superficial peroneal nerve neuropathy, 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   Wears glasses    Family History  Problem Relation Age of Onset   Colon polyps Mother    Hyperlipidemia Mother    Hypertension Mother        M and sister    Diabetes Father    Colon polyps Sister 20       8 polyps removed   High blood pressure Sister    Cancer Paternal Aunt        lung   Colon cancer Paternal Uncle    Colon cancer Other        uncle , dx at age 30s   Breast cancer Neg Hx    Esophageal cancer Neg Hx    Rectal cancer Neg Hx    Stomach cancer Neg Hx    Past Surgical History:  Procedure Laterality Date   5th toes resected bilateral  1990's   bone's removed   ABDOMINAL HYSTERECTOMY  1984   W/ UNILATERAL SALPINGOOPHORECTOMY   ABDOMINOPLASTY  1990   BIOPSY  01/26/2023   Procedure: BIOPSY;  Surgeon: Wilhelmenia Aloha Raddle., MD;  Location: [facility];  Service:  Gastroenterology;;   BREAST BIOPSY Right 2001   CESAREAN SECTION     COLONOSCOPY     COLONOSCOPY N/A 12/10/2023   Procedure: COLONOSCOPY;  Surgeon: Wilhelmenia Aloha Raddle., MD;  Location: [facility];  Service: Gastroenterology;  Laterality: N/A;   COLONOSCOPY W/ POLYPECTOMY  last one 02-14-2014   COLONOSCOPY WITH PROPOFOL  N/A 05/23/2021   Procedure: COLONOSCOPY WITH PROPOFOL ;  Surgeon: Mansouraty, Aloha Raddle., MD;  Location[facility];  Service: Gastroenterology;  Laterality: N/A;   COLONOSCOPY WITH PROPOFOL  N/A 01/26/2023   Procedure: COLONOSCOPY WITH PROPOFOL ;  Surgeon: Wilhelmenia Aloha Raddle., MD;  Location: [facility];  Service: Gastroenterology;  Laterality: N/A;   CYSTO/  RETROGRADE PYELOGRAM/  LEFT URETEROSCOPY/  BLADDER BIOPSY  08/27/2004   CYSTOSCOPY W/ RETROGRADES Bilateral 03/17/2014   Procedure: CYSTOSCOPY WITH RETROGRADE PYELOGRAM;  Surgeon: Ricardo Likens, MD;  Location: [facility];  Service: Urology;  Laterality: Bilateral;   CYSTOSCOPY WITH BIOPSY N/A 03/17/2014   Procedure: CYSTOSCOPY WITH BIOPSY AND FULGERATION;  Surgeon: Ricardo Likens, MD;  Location: [facility];  Service: Urology;  Laterality: N/A;   ENDOSCOPIC MUCOSAL RESECTION N/A 05/23/2021   Procedure: ENDOSCOPIC MUCOSAL RESECTION;  Surgeon: Wilhelmenia Aloha Raddle., MD;  Location: [facility]COPY;  Service: Gastroenterology;  Laterality: N/A;   ENDOSCOPIC MUCOSAL RESECTION  01/26/2023   Procedure: ENDOSCOPIC MUCOSAL RESECTION;  Surgeon: Wilhelmenia Aloha Raddle., MD;  Location: [facility];  Service: Gastroenterology;;   HEMOSTASIS CLIP PLACEMENT  05/23/2021   Procedure: HEMOSTASIS CLIP PLACEMENT;  Surgeon: Wilhelmenia Aloha Raddle., MD;  Location: [facility] ENDOSCOPY;  Service: Gastroenterology;;   HEMOSTASIS CONTROL  01/26/2023   Procedure: HEMOSTASIS CONTROL;  Surgeon: Wilhelmenia Aloha Raddle., MD;  Location: [facility];  Service: Gastroenterology;;   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    Gamma knife   OTHER SURGICAL HISTORY Left 2016   Cyst Removal on Left ear   POLYPECTOMY     POLYPECTOMY  05/23/2021   Procedure: POLYPECTOMY;  Surgeon: Wilhelmenia Aloha Raddle., MD;  Location: [facility] ENDOSCOPY;  Service: Gastroenterology;;   POLYPECTOMY  01/26/2023   Procedure: POLYPECTOMY;  Surgeon: Wilhelmenia Aloha Raddle., MD;  Location: [facility];  Service:  Gastroenterology;;   REMOVAL BARTHOLIN CYST/ GLAND AND REVISION LEFT LABIA  06/10/2004   SUBMUCOSAL LIFTING INJECTION  05/23/2021   Procedure: SUBMUCOSAL LIFTING INJECTION;  Surgeon: Wilhelmenia Aloha Raddle., MD;  Location: [facility] ENDOSCOPY;  Service: Gastroenterology;;   SUBMUCOSAL LIFTING INJECTION  01/26/2023   Procedure: SUBMUCOSAL LIFTING INJECTION;  Surgeon: Wilhelmenia Aloha Raddle., MD;  Location: [facility];  Service: Gastroenterology;;   SUBMUCOSAL TATTOO INJECTION  01/26/2023   Procedure: SUBMUCOSAL TATTOO INJECTION;  Surgeon: Wilhelmenia Aloha Raddle., MD;  Location: [facility];  Service: Gastroenterology;;   Social History   Occupational History   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

## 2024-03-17 ENCOUNTER — Ambulatory Visit (INDEPENDENT_AMBULATORY_CARE_PROVIDER_SITE_OTHER): Admitting: Neurology

## 2024-03-17 DIAGNOSIS — R202 Paresthesia of skin: Secondary | ICD-10-CM | POA: Diagnosis not present

## 2024-03-17 DIAGNOSIS — G5732 Lesion of lateral popliteal nerve, left lower limb: Secondary | ICD-10-CM

## 2024-03-17 NOTE — Procedures (Signed)
" °  [facility]  [address] [place], [address]  [place], [place] [address] Tel: [phone] Fax: [phone] Test Date:  03/17/2024  Patient: Erin Fox DOB: [birth year] Physician: Tonita Blanch, DO  Sex: Female Height: 5' 7.5 Ref Phys: Ozell Ada, MD  ID#: [id number]   Technician:    History: This is a 64 year old female with history of left peroneal neuropathy referred for evaluation of left thigh numbness.  NCV & EMG Findings: Extensive electrodiagnostic testing of the left lower extremity shows:  Left sural and superficial peroneal sensory responses are within normal limits. Left peroneal motor response is mildly reduced at the extensor digitorum brevis, and normal at the tibialis anterior.  Of note, there is no evidence of latency prolongation, conduction block, or conduction velocity slowing of the left peroneal nerve.  Left tibial motor response is within normal limits. Left tibial H reflex study is within normal limits. Chronic motor axon loss changes are seen in the left tibialis anterior and fibularis longus muscles, without accompanying active denervation.  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   There is no evidence of a lumbosacral radiculopathy or large fiber sensorimotor polyneuropathy.     ___________________________ Tonita Blanch, DO    Nerve Conduction Studies   Stim Site NR Peak (ms) Norm Peak (ms) O-P Amp (V) Norm O-P Amp  Left Sup Peroneal Anti Sensory (Ant Lat Mall)  32 C  12 cm    1.6 <4.6 5.4 >3  Left Sural Anti Sensory (Lat Mall)  32 C  Calf    2.3 <4.6 4.1 >3     Stim Site NR Onset (ms) Norm Onset (ms) O-P Amp (mV) Norm O-P Amp Site1 Site2 Delta-0 (ms) Dist (cm) Vel (m/s) Norm Vel (m/s)  Left Peroneal Motor (Ext Dig Brev)  32 C  Ankle     3.2 <6.0 *1.6 >2.5 B Fib Ankle 8.5 41.0 48 >40  B Fib    11.7  1.6  Poplt B Fib 1.9 9.0 47 >40  Poplt    13.6  1.4         Left Peroneal TA Motor (Tib Ant)  32 C  Fib Head    3.8 <4.5 4.0 >3 Poplit Fib Head 1.4 9.0 64 >40  Poplit    5.2 <5.7 3.9         Left Tibial Motor (Abd Hall Brev)  32 C  Ankle    4.1 <6.0 7.5 >4 Knee Ankle 8.4 45.0 54 >40  Knee    12.5  5.0          Electromyography   Side Muscle Ins.Act Fibs Fasc Recrt Amp Dur Poly Activation Comment  Left AntTibialis Nml Nml Nml *1- *2+ *1+ *1+ Nml N/A  Left Gastroc Nml Nml Nml Nml Nml Nml Nml Nml N/A  Left Flex Dig Long Nml Nml Nml Nml Nml Nml Nml Nml N/A  Left BicepsFemS Nml Nml Nml Nml Nml Nml Nml Nml N/A  Left RectFemoris Nml Nml Nml Nml Nml Nml Nml Nml N/A  Left Fibularis Long Nml Nml Nml *1- *2+ *1+ *1+ Nml N/A  Left GluteusMed Nml Nml Nml Nml Nml Nml Nml Nml N/A      Waveforms:               "

## 2024-04-06 ENCOUNTER — Encounter: Admitting: Internal Medicine
